# Patient Record
Sex: Male | Born: 1935
Health system: Southern US, Community
[De-identification: ages and names within clinical notes are randomized; demographics above are authoritative.]

## PROBLEM LIST (undated history)

## (undated) DIAGNOSIS — R51 Headache: Secondary | ICD-10-CM

## (undated) DIAGNOSIS — G4700 Insomnia, unspecified: Secondary | ICD-10-CM

## (undated) DIAGNOSIS — I10 Essential (primary) hypertension: Secondary | ICD-10-CM

## (undated) DIAGNOSIS — K5909 Other constipation: Secondary | ICD-10-CM

## (undated) DIAGNOSIS — E7800 Pure hypercholesterolemia, unspecified: Secondary | ICD-10-CM

## (undated) DIAGNOSIS — D496 Neoplasm of unspecified behavior of brain: Secondary | ICD-10-CM

## (undated) DIAGNOSIS — I499 Cardiac arrhythmia, unspecified: Secondary | ICD-10-CM

## (undated) HISTORY — DX: Cardiac arrhythmia, unspecified: I49.9

## (undated) HISTORY — DX: Insomnia, unspecified: G47.00

## (undated) HISTORY — DX: Other constipation: K59.09

## (undated) HISTORY — DX: Hypercalcemia: E83.52

## (undated) HISTORY — PX: BRAIN SURGERY: SHX531

## (undated) HISTORY — PX: SHOULDER SURGERY: SHX246

---

## 1998-05-08 ENCOUNTER — Ambulatory Visit (HOSPITAL_BASED_OUTPATIENT_CLINIC_OR_DEPARTMENT_OTHER): Admission: RE | Admit: 1998-05-08 | Discharge: 1998-05-08 | Payer: Self-pay | Admitting: General Surgery

## 1998-08-14 ENCOUNTER — Ambulatory Visit (HOSPITAL_COMMUNITY): Admission: RE | Admit: 1998-08-14 | Discharge: 1998-08-14 | Payer: Self-pay | Admitting: Family Medicine

## 1998-08-14 ENCOUNTER — Encounter: Payer: Self-pay | Admitting: Family Medicine

## 1999-02-11 ENCOUNTER — Ambulatory Visit (HOSPITAL_COMMUNITY): Admission: RE | Admit: 1999-02-11 | Discharge: 1999-02-11 | Payer: Self-pay | Admitting: Family Medicine

## 1999-02-11 ENCOUNTER — Encounter: Payer: Self-pay | Admitting: Family Medicine

## 2001-06-15 ENCOUNTER — Encounter: Payer: Self-pay | Admitting: Emergency Medicine

## 2001-06-15 ENCOUNTER — Emergency Department (HOSPITAL_COMMUNITY): Admission: EM | Admit: 2001-06-15 | Discharge: 2001-06-15 | Payer: Self-pay | Admitting: Emergency Medicine

## 2002-05-21 ENCOUNTER — Encounter: Payer: Self-pay | Admitting: Family Medicine

## 2002-05-21 ENCOUNTER — Ambulatory Visit (HOSPITAL_COMMUNITY): Admission: RE | Admit: 2002-05-21 | Discharge: 2002-05-21 | Payer: Self-pay | Admitting: Family Medicine

## 2002-11-10 ENCOUNTER — Encounter: Payer: Self-pay | Admitting: Neurology

## 2002-11-10 ENCOUNTER — Encounter: Admission: RE | Admit: 2002-11-10 | Discharge: 2002-11-10 | Payer: Self-pay | Admitting: Neurology

## 2003-09-21 ENCOUNTER — Emergency Department (HOSPITAL_COMMUNITY): Admission: EM | Admit: 2003-09-21 | Discharge: 2003-09-21 | Payer: Self-pay | Admitting: Emergency Medicine

## 2004-06-24 ENCOUNTER — Ambulatory Visit (HOSPITAL_COMMUNITY): Admission: RE | Admit: 2004-06-24 | Discharge: 2004-06-24 | Payer: Self-pay | Admitting: Family Medicine

## 2004-12-31 ENCOUNTER — Ambulatory Visit: Payer: Self-pay | Admitting: Physical Medicine & Rehabilitation

## 2004-12-31 ENCOUNTER — Encounter
Admission: RE | Admit: 2004-12-31 | Discharge: 2005-03-31 | Payer: Self-pay | Admitting: Physical Medicine & Rehabilitation

## 2005-02-17 ENCOUNTER — Ambulatory Visit (HOSPITAL_COMMUNITY): Admission: RE | Admit: 2005-02-17 | Discharge: 2005-02-17 | Payer: Self-pay | Admitting: *Deleted

## 2005-04-20 ENCOUNTER — Encounter
Admission: RE | Admit: 2005-04-20 | Discharge: 2005-07-19 | Payer: Self-pay | Admitting: Physical Medicine & Rehabilitation

## 2005-04-20 ENCOUNTER — Ambulatory Visit: Payer: Self-pay | Admitting: Physical Medicine & Rehabilitation

## 2005-06-01 ENCOUNTER — Ambulatory Visit: Payer: Self-pay | Admitting: Physical Medicine & Rehabilitation

## 2005-07-23 ENCOUNTER — Ambulatory Visit: Payer: Self-pay | Admitting: Physical Medicine & Rehabilitation

## 2005-07-23 ENCOUNTER — Encounter
Admission: RE | Admit: 2005-07-23 | Discharge: 2005-10-21 | Payer: Self-pay | Admitting: Physical Medicine & Rehabilitation

## 2005-09-02 ENCOUNTER — Emergency Department (HOSPITAL_COMMUNITY): Admission: EM | Admit: 2005-09-02 | Discharge: 2005-09-02 | Payer: Self-pay | Admitting: Emergency Medicine

## 2005-09-10 ENCOUNTER — Emergency Department (HOSPITAL_COMMUNITY): Admission: EM | Admit: 2005-09-10 | Discharge: 2005-09-10 | Payer: Self-pay | Admitting: Emergency Medicine

## 2005-10-21 ENCOUNTER — Ambulatory Visit: Payer: Self-pay | Admitting: Physical Medicine & Rehabilitation

## 2005-10-22 ENCOUNTER — Encounter
Admission: RE | Admit: 2005-10-22 | Discharge: 2006-01-20 | Payer: Self-pay | Admitting: Physical Medicine & Rehabilitation

## 2006-11-16 ENCOUNTER — Emergency Department (HOSPITAL_COMMUNITY): Admission: EM | Admit: 2006-11-16 | Discharge: 2006-11-16 | Payer: Self-pay | Admitting: Emergency Medicine

## 2006-12-29 ENCOUNTER — Encounter: Admission: RE | Admit: 2006-12-29 | Discharge: 2006-12-29 | Payer: Self-pay | Admitting: Orthopedic Surgery

## 2007-10-27 ENCOUNTER — Emergency Department (HOSPITAL_COMMUNITY): Admission: EM | Admit: 2007-10-27 | Discharge: 2007-10-27 | Payer: Self-pay | Admitting: Emergency Medicine

## 2008-06-25 ENCOUNTER — Emergency Department (HOSPITAL_COMMUNITY): Admission: EM | Admit: 2008-06-25 | Discharge: 2008-06-25 | Payer: Self-pay | Admitting: Emergency Medicine

## 2008-06-27 ENCOUNTER — Encounter: Payer: Self-pay | Admitting: Endocrinology

## 2008-08-18 ENCOUNTER — Inpatient Hospital Stay (HOSPITAL_COMMUNITY): Admission: EM | Admit: 2008-08-18 | Discharge: 2008-08-22 | Payer: Self-pay | Admitting: Emergency Medicine

## 2008-08-28 ENCOUNTER — Emergency Department (HOSPITAL_COMMUNITY): Admission: EM | Admit: 2008-08-28 | Discharge: 2008-08-28 | Payer: Self-pay | Admitting: Emergency Medicine

## 2008-09-12 ENCOUNTER — Encounter: Admission: RE | Admit: 2008-09-12 | Discharge: 2008-09-12 | Payer: Self-pay | Admitting: Family Medicine

## 2008-12-02 ENCOUNTER — Encounter: Payer: Self-pay | Admitting: Endocrinology

## 2008-12-02 ENCOUNTER — Encounter: Admission: RE | Admit: 2008-12-02 | Discharge: 2008-12-02 | Payer: Self-pay | Admitting: Family Medicine

## 2008-12-24 ENCOUNTER — Encounter: Admission: RE | Admit: 2008-12-24 | Discharge: 2008-12-24 | Payer: Self-pay | Admitting: Neurology

## 2009-01-09 ENCOUNTER — Ambulatory Visit: Payer: Self-pay | Admitting: Endocrinology

## 2009-01-09 DIAGNOSIS — E210 Primary hyperparathyroidism: Secondary | ICD-10-CM | POA: Insufficient documentation

## 2009-01-09 DIAGNOSIS — E7800 Pure hypercholesterolemia, unspecified: Secondary | ICD-10-CM

## 2009-01-09 DIAGNOSIS — R51 Headache: Secondary | ICD-10-CM

## 2009-01-09 DIAGNOSIS — I10 Essential (primary) hypertension: Secondary | ICD-10-CM | POA: Insufficient documentation

## 2009-01-09 DIAGNOSIS — F411 Generalized anxiety disorder: Secondary | ICD-10-CM | POA: Insufficient documentation

## 2009-01-09 DIAGNOSIS — G4700 Insomnia, unspecified: Secondary | ICD-10-CM

## 2009-01-21 ENCOUNTER — Encounter: Payer: Self-pay | Admitting: Endocrinology

## 2009-02-03 ENCOUNTER — Telehealth: Payer: Self-pay | Admitting: Endocrinology

## 2009-02-13 ENCOUNTER — Telehealth: Payer: Self-pay | Admitting: Endocrinology

## 2009-07-01 ENCOUNTER — Ambulatory Visit: Payer: Self-pay | Admitting: Endocrinology

## 2009-07-01 DIAGNOSIS — M25519 Pain in unspecified shoulder: Secondary | ICD-10-CM | POA: Insufficient documentation

## 2009-07-01 DIAGNOSIS — M810 Age-related osteoporosis without current pathological fracture: Secondary | ICD-10-CM

## 2009-07-07 ENCOUNTER — Encounter: Payer: Self-pay | Admitting: Endocrinology

## 2009-07-07 ENCOUNTER — Telehealth: Payer: Self-pay | Admitting: Endocrinology

## 2009-07-09 LAB — CONVERTED CEMR LAB
Calcium, Total (PTH): 11.1 mg/dL — ABNORMAL HIGH (ref 8.4–10.5)
PTH: 174.8 pg/mL — ABNORMAL HIGH (ref 14.0–72.0)

## 2009-08-25 ENCOUNTER — Emergency Department (HOSPITAL_COMMUNITY): Admission: EM | Admit: 2009-08-25 | Discharge: 2009-08-25 | Payer: Self-pay | Admitting: Internal Medicine

## 2009-11-21 ENCOUNTER — Emergency Department (HOSPITAL_COMMUNITY): Admission: EM | Admit: 2009-11-21 | Discharge: 2009-11-21 | Payer: Self-pay | Admitting: Emergency Medicine

## 2010-03-27 ENCOUNTER — Emergency Department (HOSPITAL_COMMUNITY): Admission: EM | Admit: 2010-03-27 | Discharge: 2010-03-27 | Payer: Self-pay | Admitting: Emergency Medicine

## 2010-05-18 ENCOUNTER — Ambulatory Visit (HOSPITAL_COMMUNITY)
Admission: RE | Admit: 2010-05-18 | Discharge: 2010-05-18 | Payer: Self-pay | Source: Home / Self Care | Attending: Family Medicine | Admitting: Family Medicine

## 2010-05-26 ENCOUNTER — Emergency Department (HOSPITAL_COMMUNITY)
Admission: EM | Admit: 2010-05-26 | Discharge: 2010-05-26 | Payer: Self-pay | Source: Home / Self Care | Admitting: Emergency Medicine

## 2010-05-27 LAB — COMPREHENSIVE METABOLIC PANEL
BUN: 9 mg/dL (ref 6–23)
CO2: 28 mEq/L (ref 19–32)
Calcium: 11.3 mg/dL — ABNORMAL HIGH (ref 8.4–10.5)
Creatinine, Ser: 1.03 mg/dL (ref 0.4–1.5)
GFR calc non Af Amer: >60 mL/min (ref >60)
Glucose, Bld: 104 mg/dL — ABNORMAL HIGH (ref 70–99)

## 2010-05-27 LAB — CBC
HCT: 38.5 % — ABNORMAL LOW (ref 39.0–52.0)
Hemoglobin: 13.2 g/dL (ref 13.0–17.0)
MCH: 31.5 pg (ref 26.0–34.0)
MCV: 91.9 fL (ref 78.0–100.0)
RBC: 4.19 MIL/uL — ABNORMAL LOW (ref 4.22–5.81)

## 2010-05-27 LAB — DIFFERENTIAL
Lymphs Abs: 0.8 10*3/uL (ref 0.7–4.0)
Monocytes Relative: 9 % (ref 3–12)
Neutro Abs: 2.8 10*3/uL (ref 1.7–7.7)
Neutrophils Relative %: 71 % (ref 43–77)

## 2010-05-27 LAB — URINE CULTURE

## 2010-05-27 LAB — PROTIME-INR
INR: 0.92 (ref 0.00–1.49)
Prothrombin Time: 12.6 seconds (ref 11.6–15.2)

## 2010-05-27 LAB — URINALYSIS, ROUTINE W REFLEX MICROSCOPIC
Bilirubin Urine: NEGATIVE
Hgb urine dipstick: NEGATIVE
Protein, ur: NEGATIVE mg/dL
Urobilinogen, UA: 0.2 mg/dL (ref 0.0–1.0)

## 2010-05-27 LAB — POCT CARDIAC MARKERS: Troponin i, poc: <0.05 ng/mL (ref 0.00–0.09)

## 2010-05-27 LAB — LIPASE, BLOOD: Lipase: 22 U/L (ref 11–59)

## 2010-06-02 NOTE — Assessment & Plan Note (Signed)
Summary: follow up per dr swartz-lb   Vital Signs:  Patient profile:   77 year old male Height:      72 inches (182.88 cm) Weight:      149.38 pounds (67.90 kg) O2 Sat:      98 % on Room air Temp:     98.5 degrees F (36.94 degrees C) oral Pulse rate:   95 / minute BP sitting:   120 / 66  (left arm) Cuff size:   regular  Vitals Entered By: Josph Macho RMA (July 01, 2009 8:48 AM)  O2 Flow:  Room air CC: Follow-up visit per Swartz/ CF   Referring Provider:  swayne Primary Provider:  Azucena Cecil  CC:  Follow-up visit per Swartz/ CF.  History of Present Illness: the status of 3 chronic medical problems is addressed today: pt is here to f/u primary hyperparathyroidism.  pt states he feels well in general, except for arthralgias, worse at the right shoulder. mild osteoporosis was noted in 2010.  he denies falls. htn:  he take zestoretic as rx'ed.  he denies cramps.   Current Medications (verified): 1)  Tizanidine Hcl 2 Mg Tabs (Tizanidine Hcl) .Marland Kitchen.. 1 Tab Three Times A Day As Needed 2)  Simvastatin 20 Mg Tabs (Simvastatin) .Marland Kitchen.. 1 Tab Qpm 3)  Lisinopril-Hydrochlorothiazide 20-25 Mg Tabs (Lisinopril-Hydrochlorothiazide) .... 1/2 Tab Daily 4)  Lorazepam 1 Mg Tabs (Lorazepam) .Marland Kitchen.. 1 At Bedtime As Needed 5)  Hydrocodone-Acetaminophen 5-500 Mg Tabs (Hydrocodone-Acetaminophen) .... As Needed  Allergies (verified): No Known Drug Allergies  Past History:  Past Medical History: Last updated: 01/09/2009 HYPERTENSION (ICD-401.9) PRIMARY HYPERPARATHYROIDISM (ICD-252.01) HYPERCALCEMIA (ICD-275.42) HEADACHE (ICD-784.0) ANXIETY STATE, UNSPECIFIED (ICD-300.00) HYPERCHOLESTEROLEMIA, MIXED (ICD-272.0) INSOMNIA UNSPECIFIED (ICD-780.52)  Review of Systems       denies numbness and constipation  Physical Exam  General:  normal appearance.   Msk:  gait is normal and steady Extremities:  right shoulder: abduction is limited to 30 degrees, by pain. Additional Exam:  dexa (2010)   osteopenia  RIGHT SHOULDER - 2+ VIEW Advanced degenerative changes in the right shoulder.   Impression & Recommendations:  Problem # 1:  PRIMARY HYPERPARATHYROIDISM (ICD-252.01) hypercalcemia could raise ca++ level.  Problem # 2:  HYPERTENSION (ICD-401.9) he may be controlled with a lower dosage of hctz  Problem # 3:  SHOULDER PAIN, RIGHT (ICD-719.41) due to oa  Problem # 4:  OSTEOPOROSIS (ICD-733.00) mild.  is prob contributed to by #1  Medications Added to Medication List This Visit: 1)  Lisinopril-hydrochlorothiazide 20-25 Mg Tabs (Lisinopril-hydrochlorothiazide) .... 1/2 tab daily  Other Orders: T-Parathyroid Hormone, Intact w/ Calcium (16109-60454) T-Shoulder Right (73030TC) Est. Patient Level IV (09811)  Patient Instructions: 1)  tests are being ordered for you today.  a few days after the test(s), please call [phone] to hear your test results. 2)  return 6 months. 3)  pending the test results, please continue the same medications for now. 4)  (update: i left message on phone-tree:  i offered ref ortho, or you may prefer to see dr Azucena Cecil for this, which is a good idea.  as of 07/05/09, pth result is not available.  pt will be called to see if he had it drawn).

## 2010-06-02 NOTE — Progress Notes (Signed)
----   Converted from flag ---- ---- 07/05/2009 5:50 PM, Minus Breeding MD wrote: please call patient: did he have blood drawn when he was here? ------------------------------  Phone Note Outgoing Call   Summary of Call: Called pt and spoke to spouse. Spouse informed me that pt would go to lab sometime today. Initial call taken by: Josph Macho RMA,  July 07, 2009 8:28 AM  Follow-up for Phone Call        thank you Follow-up by: Minus Breeding MD,  July 07, 2009 8:54 AM

## 2010-07-14 LAB — HEPATIC FUNCTION PANEL
ALT: 18 U/L (ref 0–53)
AST: 18 U/L (ref 0–37)
Albumin: 4.5 g/dL (ref 3.5–5.2)
Total Protein: 7.6 g/dL (ref 6.0–8.3)

## 2010-07-14 LAB — BASIC METABOLIC PANEL
BUN: 18 mg/dL (ref 6–23)
GFR calc non Af Amer: >60 mL/min (ref >60)
Potassium: 4.6 mEq/L (ref 3.5–5.1)

## 2010-07-14 LAB — CBC
HCT: 41.3 % (ref 39.0–52.0)
Platelets: 193 10*3/uL (ref 150–400)
RDW: 11.4 % — ABNORMAL LOW (ref 11.5–15.5)
WBC: 3.8 10*3/uL — ABNORMAL LOW (ref 4.0–10.5)

## 2010-07-14 LAB — URINALYSIS, ROUTINE W REFLEX MICROSCOPIC
Bilirubin Urine: NEGATIVE
Glucose, UA: NEGATIVE mg/dL
Hgb urine dipstick: NEGATIVE
Specific Gravity, Urine: 1.014 (ref 1.005–1.030)
pH: 5.5 (ref 5.0–8.0)

## 2010-07-14 LAB — DIFFERENTIAL
Basophils Absolute: 0 10*3/uL (ref 0.0–0.1)
Lymphocytes Relative: 29 % (ref 12–46)
Monocytes Absolute: 0.4 10*3/uL (ref 0.1–1.0)
Neutro Abs: 2.2 10*3/uL (ref 1.7–7.7)
Neutrophils Relative %: 59 % (ref 43–77)

## 2010-07-18 LAB — URINALYSIS, ROUTINE W REFLEX MICROSCOPIC
Bilirubin Urine: NEGATIVE
Hgb urine dipstick: NEGATIVE
Ketones, ur: NEGATIVE mg/dL
Nitrite: NEGATIVE
Protein, ur: NEGATIVE mg/dL
Specific Gravity, Urine: 1.024 (ref 1.005–1.030)
Urobilinogen, UA: 0.2 mg/dL (ref 0.0–1.0)

## 2010-07-18 LAB — CBC
HCT: 36.5 % — ABNORMAL LOW (ref 39.0–52.0)
MCV: 96.3 fL (ref 78.0–100.0)
RBC: 3.79 MIL/uL — ABNORMAL LOW (ref 4.22–5.81)
WBC: 4 10*3/uL (ref 4.0–10.5)

## 2010-07-18 LAB — COMPREHENSIVE METABOLIC PANEL
ALT: 15 U/L (ref 0–53)
Albumin: 4 g/dL (ref 3.5–5.2)
Alkaline Phosphatase: 76 U/L (ref 39–117)
BUN: 16 mg/dL (ref 6–23)
Chloride: 108 mEq/L (ref 96–112)
Glucose, Bld: 110 mg/dL — ABNORMAL HIGH (ref 70–99)
Potassium: 4.1 mEq/L (ref 3.5–5.1)
Sodium: 139 mEq/L (ref 135–145)
Total Bilirubin: 0.7 mg/dL (ref 0.3–1.2)

## 2010-07-18 LAB — DIFFERENTIAL
Basophils Absolute: 0 10*3/uL (ref 0.0–0.1)
Basophils Relative: 1 % (ref 0–1)
Eosinophils Absolute: 0 10*3/uL (ref 0.0–0.7)
Monocytes Absolute: 0.4 10*3/uL (ref 0.1–1.0)
Neutro Abs: 2.8 10*3/uL (ref 1.7–7.7)
Neutrophils Relative %: 69 % (ref 43–77)

## 2010-07-21 LAB — DIFFERENTIAL
Basophils Absolute: 0 10*3/uL (ref 0.0–0.1)
Eosinophils Relative: 1 % (ref 0–5)
Lymphocytes Relative: 22 % (ref 12–46)
Lymphs Abs: 0.8 10*3/uL (ref 0.7–4.0)
Monocytes Absolute: 0.4 10*3/uL (ref 0.1–1.0)
Neutro Abs: 2.4 10*3/uL (ref 1.7–7.7)

## 2010-07-21 LAB — PROTIME-INR: Prothrombin Time: 12.7 seconds (ref 11.6–15.2)

## 2010-07-21 LAB — COMPREHENSIVE METABOLIC PANEL
AST: 18 U/L (ref 0–37)
Albumin: 3.9 g/dL (ref 3.5–5.2)
BUN: 13 mg/dL (ref 6–23)
Calcium: 10.7 mg/dL — ABNORMAL HIGH (ref 8.4–10.5)
Chloride: 103 mEq/L (ref 96–112)
Creatinine, Ser: 1.08 mg/dL (ref 0.4–1.5)
GFR calc Af Amer: >60 mL/min (ref >60)
Total Bilirubin: 1 mg/dL (ref 0.3–1.2)

## 2010-07-21 LAB — URINALYSIS, ROUTINE W REFLEX MICROSCOPIC
Bilirubin Urine: NEGATIVE
Glucose, UA: NEGATIVE mg/dL
Hgb urine dipstick: NEGATIVE
Ketones, ur: NEGATIVE mg/dL
Protein, ur: NEGATIVE mg/dL
Urobilinogen, UA: 0.2 mg/dL (ref 0.0–1.0)

## 2010-07-21 LAB — CBC
HCT: 35.8 % — ABNORMAL LOW (ref 39.0–52.0)
MCHC: 36 g/dL (ref 30.0–36.0)
MCV: 95.7 fL (ref 78.0–100.0)
Platelets: 190 10*3/uL (ref 150–400)
RDW: 11.6 % (ref 11.5–15.5)

## 2010-08-12 LAB — CBC
HCT: 34 % — ABNORMAL LOW (ref 39.0–52.0)
Hemoglobin: 11.2 g/dL — ABNORMAL LOW (ref 13.0–17.0)
Hemoglobin: 11.6 g/dL — ABNORMAL LOW (ref 13.0–17.0)
Hemoglobin: 9.6 g/dL — ABNORMAL LOW (ref 13.0–17.0)
MCHC: 34.2 g/dL (ref 30.0–36.0)
MCV: 95.2 fL (ref 78.0–100.0)
MCV: 96 fL (ref 78.0–100.0)
MCV: 96.5 fL (ref 78.0–100.0)
Platelets: 204 10*3/uL (ref 150–400)
RBC: 2.78 MIL/uL — ABNORMAL LOW (ref 4.22–5.81)
RBC: 2.88 MIL/uL — ABNORMAL LOW (ref 4.22–5.81)
RBC: 3.39 MIL/uL — ABNORMAL LOW (ref 4.22–5.81)
RBC: 3.54 MIL/uL — ABNORMAL LOW (ref 4.22–5.81)
RDW: 11.9 % (ref 11.5–15.5)
WBC: 4.6 10*3/uL (ref 4.0–10.5)
WBC: 6.9 10*3/uL (ref 4.0–10.5)

## 2010-08-12 LAB — URINALYSIS, ROUTINE W REFLEX MICROSCOPIC
Bilirubin Urine: NEGATIVE
Glucose, UA: NEGATIVE mg/dL
Glucose, UA: NEGATIVE mg/dL
Hgb urine dipstick: NEGATIVE
Ketones, ur: 15 mg/dL — AB
Ketones, ur: NEGATIVE mg/dL
Nitrite: NEGATIVE
Nitrite: NEGATIVE
Protein, ur: NEGATIVE mg/dL
Specific Gravity, Urine: 1.005 (ref 1.005–1.030)
Urobilinogen, UA: 0.2 mg/dL (ref 0.0–1.0)
pH: 5.5 (ref 5.0–8.0)
pH: 7.5 (ref 5.0–8.0)

## 2010-08-12 LAB — DIFFERENTIAL
Basophils Absolute: 0 10*3/uL (ref 0.0–0.1)
Basophils Relative: 0 % (ref 0–1)
Basophils Relative: 0 % (ref 0–1)
Eosinophils Absolute: 0 10*3/uL (ref 0.0–0.7)
Eosinophils Absolute: 0 10*3/uL (ref 0.0–0.7)
Eosinophils Absolute: 0.1 10*3/uL (ref 0.0–0.7)
Eosinophils Relative: 0 % (ref 0–5)
Eosinophils Relative: 1 % (ref 0–5)
Lymphocytes Relative: 11 % — ABNORMAL LOW (ref 12–46)
Lymphocytes Relative: 8 % — ABNORMAL LOW (ref 12–46)
Lymphs Abs: 0.7 10*3/uL (ref 0.7–4.0)
Lymphs Abs: 1.2 10*3/uL (ref 0.7–4.0)
Monocytes Absolute: 0.4 10*3/uL (ref 0.1–1.0)
Monocytes Absolute: 0.5 10*3/uL (ref 0.1–1.0)
Monocytes Absolute: 0.7 10*3/uL (ref 0.1–1.0)
Monocytes Absolute: 1 10*3/uL (ref 0.1–1.0)
Monocytes Relative: 7 % (ref 3–12)
Monocytes Relative: 8 % (ref 3–12)
Monocytes Relative: 8 % (ref 3–12)
Monocytes Relative: 8 % (ref 3–12)
Neutro Abs: 5.6 10*3/uL (ref 1.7–7.7)
Neutro Abs: 8.2 10*3/uL — ABNORMAL HIGH (ref 1.7–7.7)
Neutrophils Relative %: 64 % (ref 43–77)
Neutrophils Relative %: 81 % — ABNORMAL HIGH (ref 43–77)

## 2010-08-12 LAB — IRON AND TIBC: Iron: 16 ug/dL — ABNORMAL LOW (ref 42–135)

## 2010-08-12 LAB — POCT I-STAT, CHEM 8
BUN: 14 mg/dL (ref 6–23)
Calcium, Ion: 1.36 mmol/L — ABNORMAL HIGH (ref 1.12–1.32)
Chloride: 105 mEq/L (ref 96–112)
Creatinine, Ser: 1.1 mg/dL (ref 0.4–1.5)
Glucose, Bld: 104 mg/dL — ABNORMAL HIGH (ref 70–99)
HCT: 34 % — ABNORMAL LOW (ref 39.0–52.0)
Hemoglobin: 11.6 g/dL — ABNORMAL LOW (ref 13.0–17.0)
Potassium: 4 mEq/L (ref 3.5–5.1)
Sodium: 137 mEq/L (ref 135–145)
TCO2: 25 mmol/L (ref 0–100)

## 2010-08-12 LAB — RETICULOCYTES
RBC.: 3.25 MIL/uL — ABNORMAL LOW (ref 4.22–5.81)
Retic Count, Absolute: 22.8 10*3/uL (ref 19.0–186.0)

## 2010-08-12 LAB — BASIC METABOLIC PANEL
BUN: 11 mg/dL (ref 6–23)
CO2: 26 mEq/L (ref 19–32)
Calcium: 10.2 mg/dL (ref 8.4–10.5)
Calcium: 10.4 mg/dL (ref 8.4–10.5)
Chloride: 98 mEq/L (ref 96–112)
Creatinine, Ser: 0.88 mg/dL (ref 0.4–1.5)
GFR calc Af Amer: >60 mL/min (ref >60)
GFR calc Af Amer: >60 mL/min (ref >60)
GFR calc Af Amer: >60 mL/min (ref >60)
GFR calc non Af Amer: >60 mL/min (ref >60)
GFR calc non Af Amer: >60 mL/min (ref >60)
Glucose, Bld: 120 mg/dL — ABNORMAL HIGH (ref 70–99)
Sodium: 133 mEq/L — ABNORMAL LOW (ref 135–145)
Sodium: 135 mEq/L (ref 135–145)

## 2010-08-12 LAB — CULTURE, RESPIRATORY W GRAM STAIN: Culture: NORMAL

## 2010-08-12 LAB — CULTURE, BLOOD (ROUTINE X 2)
Culture: NO GROWTH
Culture: NO GROWTH

## 2010-08-12 LAB — HEPATIC FUNCTION PANEL
ALT: 16 U/L (ref 0–53)
AST: 21 U/L (ref 0–37)
Albumin: 2.9 g/dL — ABNORMAL LOW (ref 3.5–5.2)
Total Bilirubin: 1 mg/dL (ref 0.3–1.2)

## 2010-08-12 LAB — POCT CARDIAC MARKERS
CKMB, poc: 2.2 ng/mL (ref 1.0–8.0)
Myoglobin, poc: 121 ng/mL (ref 12–200)
Troponin i, poc: <0.05 ng/mL (ref 0.00–0.09)

## 2010-08-12 LAB — PROTIME-INR: INR: 1.1 (ref 0.00–1.49)

## 2010-08-12 LAB — APTT: aPTT: 34 seconds (ref 24–37)

## 2010-08-12 LAB — VITAMIN B12: Vitamin B-12: 496 pg/mL (ref 211–911)

## 2010-08-12 LAB — FERRITIN: Ferritin: 2018 ng/mL — ABNORMAL HIGH (ref 22–322)

## 2010-08-12 LAB — EXPECTORATED SPUTUM ASSESSMENT W GRAM STAIN, RFLX TO RESP C

## 2010-08-18 LAB — COMPREHENSIVE METABOLIC PANEL
AST: 20 U/L (ref 0–37)
Albumin: 3.6 g/dL (ref 3.5–5.2)
Alkaline Phosphatase: 73 U/L (ref 39–117)
Chloride: 103 mEq/L (ref 96–112)
Creatinine, Ser: 1.06 mg/dL (ref 0.4–1.5)
GFR calc Af Amer: >60 mL/min (ref >60)
Potassium: 3.9 mEq/L (ref 3.5–5.1)
Total Bilirubin: 0.7 mg/dL (ref 0.3–1.2)

## 2010-08-18 LAB — POCT CARDIAC MARKERS
CKMB, poc: 1.9 ng/mL (ref 1.0–8.0)
Troponin i, poc: <0.05 ng/mL (ref 0.00–0.09)

## 2010-08-18 LAB — DIFFERENTIAL
Basophils Absolute: 0 10*3/uL (ref 0.0–0.1)
Eosinophils Relative: 1 % (ref 0–5)
Lymphocytes Relative: 19 % (ref 12–46)
Monocytes Absolute: 0.3 10*3/uL (ref 0.1–1.0)

## 2010-08-18 LAB — BRAIN NATRIURETIC PEPTIDE: Pro B Natriuretic peptide (BNP): <30 pg/mL (ref 0.0–100.0)

## 2010-08-18 LAB — HEMOCCULT GUIAC POC 1CARD (OFFICE): Fecal Occult Bld: NEGATIVE

## 2010-08-18 LAB — CBC
Platelets: 196 10*3/uL (ref 150–400)
WBC: 3.1 10*3/uL — ABNORMAL LOW (ref 4.0–10.5)

## 2010-09-10 ENCOUNTER — Emergency Department (HOSPITAL_COMMUNITY)
Admission: EM | Admit: 2010-09-10 | Discharge: 2010-09-10 | Disposition: A | Discharge status: Home / Self Care | Payer: Medicare Other | Attending: Emergency Medicine | Admitting: Emergency Medicine

## 2010-09-10 ENCOUNTER — Emergency Department (HOSPITAL_COMMUNITY): Payer: Medicare Other

## 2010-09-10 ENCOUNTER — Encounter (HOSPITAL_COMMUNITY): Payer: Self-pay | Admitting: Radiology

## 2010-09-10 DIAGNOSIS — M25569 Pain in unspecified knee: Secondary | ICD-10-CM | POA: Insufficient documentation

## 2010-09-10 DIAGNOSIS — Z79899 Other long term (current) drug therapy: Secondary | ICD-10-CM | POA: Insufficient documentation

## 2010-09-10 DIAGNOSIS — F411 Generalized anxiety disorder: Secondary | ICD-10-CM | POA: Insufficient documentation

## 2010-09-10 DIAGNOSIS — R29898 Other symptoms and signs involving the musculoskeletal system: Secondary | ICD-10-CM | POA: Insufficient documentation

## 2010-09-10 DIAGNOSIS — M25519 Pain in unspecified shoulder: Secondary | ICD-10-CM | POA: Insufficient documentation

## 2010-09-10 DIAGNOSIS — R51 Headache: Secondary | ICD-10-CM | POA: Insufficient documentation

## 2010-09-10 DIAGNOSIS — I10 Essential (primary) hypertension: Secondary | ICD-10-CM | POA: Insufficient documentation

## 2010-09-10 DIAGNOSIS — G8929 Other chronic pain: Secondary | ICD-10-CM | POA: Insufficient documentation

## 2010-09-10 DIAGNOSIS — M129 Arthropathy, unspecified: Secondary | ICD-10-CM | POA: Insufficient documentation

## 2010-09-10 DIAGNOSIS — E7800 Pure hypercholesterolemia, unspecified: Secondary | ICD-10-CM | POA: Insufficient documentation

## 2010-09-10 DIAGNOSIS — M1990 Unspecified osteoarthritis, unspecified site: Secondary | ICD-10-CM | POA: Insufficient documentation

## 2010-09-10 HISTORY — DX: Essential (primary) hypertension: I10

## 2010-09-10 HISTORY — DX: Neoplasm of unspecified behavior of brain: D49.6

## 2010-09-15 NOTE — H&P (Signed)
NAMEEBONY, Richard Mack NO.:  [id number]   MEDICAL RECORD NO.:  [id number]          PATIENT TYPE:  EMS   LOCATION:  MAJO                         FACILITY:  [facility]   PHYSICIAN:  Ramiro Harvest, MD    DATE OF BIRTH:  February 17, 1929   DATE OF ADMISSION:  08/18/2008  DATE OF DISCHARGE:                              HISTORY & PHYSICAL   PRIMARY CARE PHYSICIAN:  Dr. Azucena Cecil, of [facility].   HISTORY OF PRESENT ILLNESS:  Richard Mack is a 77 year old African  American male with a history of brain tumor/cyst status post resection,  hyperlipidemia, hypertension, osteoarthritis, presented to the ED with a  5-day history of worsening productive cough of yellowish sputum, some  myalgias, fevers, upper respiratory symptoms, shortness of breath,  chills, nausea, global weakness and abdominal pain secondary to coughing  and concentrated urine per patient.  The patient denies any emesis, no  diarrhea, no constipation, no melena, no hematemesis, no hematochezia,  no focal neurological symptoms.  The patient also complaining of right  chronic shoulder pain.  The patient was seen in the ED.  A CBC obtained  showed a white count of 12.4, hemoglobin of 11.6, otherwise was within  normal limits.  Basic metabolic profile with a BUN of 27, otherwise was  within normal limits.  UA with trace leukocytes and nitrite negative.  Chest x-ray showed a right lower lobe pneumonia.  The patient was given  some IV Rocephin and azithromycin in the emergency department.  We are  called to admit the patient for further evaluation and management.   ALLERGIES:  No known drug allergies.   PAST MEDICAL HISTORY:  1. Brain cyst/tumor status post resection in the 1990s.  2. Hyperlipidemia.  3. Hypertension.  4. History of osteoarthritis.  5. History of anxiety.  6. A history of complete supraspinatus tendon tear with likely at      least partial tearing of the infraspinatus in August 2008.  7. A history  of bulky acromioclavicular osteoarthritis and subacromial      subdeltoid bursitis per MRI of August 2008.   HOME MEDICATIONS:  1. Hydrocodone/APAP 5/500 1 tablet p.o. q.8 h p.r.n. pain.  2. Simvastatin 20 mg p.o. daily.  3. Lisinopril/hydrochlorothiazide 20/25 one tablet p.o. daily.  4. Lorazepam 1 mg nightly p.r.n.   SOCIAL HISTORY:  The patient is married.  Remote tobacco history.  Remote alcohol use.  No IV drug use.   FAMILY HISTORY:  Noncontributory.   REVIEW OF SYSTEMS:  As per HPI, otherwise negative.   PHYSICAL EXAM:  Initial temperature 102.2, blood pressure 114/64, pulse  of 118, respiratory rate 20, saturating 99%.  GENERAL:  Patient in no apparent distress.  HEENT:  Normocephalic, atraumatic.  Pupils equal, round and reactive to  light and accommodation.  Arcus senilis.  Extraocular movements intact.  Oropharynx is clear, no lesions, no exudates.  NECK:  Supple, no lymphadenopathy, dry mucous membranes.  RESPIRATORY:  Bibasilar crackles, no wheezing.  CARDIOVASCULAR:  Tachycardiac, regular rhythm.  No murmurs, rubs or  gallops.  ABDOMEN:  Soft, nontender, nondistended.  Positive bowel sounds.  EXTREMITIES:  No clubbing, cyanosis or edema.  NEUROLOGICALLY:  The patient is alert and oriented x3.  Cranial Nerves:  II-XII are grossly intact.  No focal deficits.   ADMISSION LABS:  CBC:  White count 12.4, a hemoglobin of 11.6,  hematocrit of 34.0, platelet count of 210.  ANC of 10.8.  Basic  metabolic profile:  Sodium 133, potassium 4.0, chloride 98, bicarbonate  26, glucose 120.  BUN 27, creatinine 1.35, calcium of 11.0.  UA is  amber, is cloudy, specific gravity 1.020, pH of 5.5, glucose negative,  bilirubin small, ketones 15, blood negative, protein 30, urobilinogen  greater than 8, nitrite negative, leukocytes trace.  Urine microscopy:  WBC 0-2, RBCs 0-2.  Chest x-ray shows a right lower lobe pneumonia with  chronic changes.  Electrocardiogram shows sinus tachycardia  with  occasional PVCs.   ASSESSMENT AND PLAN:  Richard Mack is a 77 year old gentleman history of  brain tumor/cyst status post resection per patient, hyperlipidemia,  hypertension presenting to the ED with a 5-day history of upper  respiratory symptoms, cough, chills, productive cough of yellowish  sputum, chills, fever and shortness of breath, global weakness and  myalgias found to have a right lower lobe pneumonia.  1. Right lower lobe pneumonia likely a community-acquired pneumonia.      Will admit the patient to a regular bed, check a sputum Gram stain      and culture, check a urine Legionella, pneumococcus antigen.  Check      a hepatic panel.  Blood cultures are pending.  Place on empiric      intravenous Rocephin and azithromycin and follow.  Will admit the      patient to telemetry.  2. Dehydration.  Intravenous fluids.  3. Leukocytosis, likely secondary to problem number one, see number      one.  4. Hyperlipidemia.  Simvastatin.  5. Sinus tachycardia is likely secondary to problem numbers one and      two.  Hydrate with intravenous fluids and follow.  If no      improvement in sinus tachycardia or worsening, will cycle cardiac      enzymes.  Will monitor for now.  6. Hypertension.  Hold blood pressure medication secondary to problem      number two and follow.  7. Hyperlipidemia.  Continue home dose simvastatin.  8. Anxiety.  Ativan.  9. History of brain tumor, status post resection.  10.Anemia.  We will check an anemia panel and guaiac stools.  11.Prophylaxis.  Protonix for gastrointestinal prophylaxis, Lovenox      for deep vein thrombosis prophylaxis.   It has been a pleasure taking care of Richard Mack.      Ramiro Harvest, MD  Electronically Signed     DT/MEDQ  D:  08/18/2008  T:  08/18/2008  Job:  [id number]   cc:   Tally Joe, M.D. old African  American male with a history of brain tumor/cyst status post resection,  hyperlipidemia, hypertension, osteoarthritis, presented to the ED with a  5-day history of worsening productive cough of yellowish sputum, some  myalgias, fevers, upper respiratory symptoms, shortness of breath,  chills, nausea, global weakness and abdominal pain secondary to coughing  and concentrated urine per patient.  The patient denies any emesis, no  diarrhea, no constipation, no melena, no hematemesis, no hematochezia,  no focal neurological symptoms.  The patient also complaining of right  chronic shoulder pain.  The patient was seen in the ED.  A CBC obtained  showed a white count of 12.4, hemoglobin of 11.6, otherwise was within  normal limits.  Basic metabolic profile with a BUN of 27, otherwise was  within normal limits.  UA with trace leukocytes and nitrite negative.  Chest x-ray showed a right lower lobe pneumonia.  The patient was given  some IV Rocephin and azithromycin in the emergency department.  We are  called to admit the patient for further evaluation and management.   ALLERGIES:  No known drug allergies.   PAST MEDICAL HISTORY:  1. Brain cyst/tumor status post resection in the 1990s.  2. Hyperlipidemia.  3. Hypertension.  4. History of osteoarthritis.  5. History of anxiety.  6. A history of complete supraspinatus tendon tear with likely at      least partial tearing of the infraspinatus in August 2008.  7. A history  of bulky acromioclavicular osteoarthritis and subacromial      subdeltoid bursitis per MRI of August 2008.   HOME MEDICATIONS:  1. Hydrocodone/APAP 5/500 1 tablet p.o. q.8 h p.r.n. pain.  2. Simvastatin 20 mg p.o. daily.  3. Lisinopril/hydrochlorothiazide 20/25 one tablet p.o. daily.  4. Lorazepam 1 mg nightly p.r.n.   SOCIAL HISTORY:  The patient is married.  Remote tobacco history.  Remote alcohol use.  No IV drug use.   FAMILY HISTORY:  Noncontributory.   REVIEW OF SYSTEMS:  As per HPI, otherwise negative.   PHYSICAL EXAM:  Initial temperature 102.2, blood pressure 114/64, pulse  of 118, respiratory rate 20, saturating 99%.  GENERAL:  Patient in no apparent distress.  HEENT:  Normocephalic, atraumatic.  Pupils equal, round and reactive to  light and accommodation.  Arcus senilis.  Extraocular movements intact.  Oropharynx is clear, no lesions, no exudates.  NECK:  Supple, no lymphadenopathy, dry mucous membranes.  RESPIRATORY:  Bibasilar crackles, no wheezing.  CARDIOVASCULAR:  Tachycardiac, regular rhythm.  No murmurs, rubs or  gallops.  ABDOMEN:  Soft, nontender, nondistended.  Positive bowel sounds.  EXTREMITIES:  No clubbing, cyanosis or edema.  NEUROLOGICALLY:  The patient is alert and oriented x3.  Cranial Nerves:  II-XII are grossly intact.  No focal deficits.   ADMISSION LABS:  CBC:  White count 12.4, a hemoglobin of 11.6,  hematocrit of 34.0, platelet count of 210.  ANC of 10.8.  Basic  metabolic profile:  Sodium 133, potassium 4.0, chloride 98, bicarbonate  26, glucose 120.  BUN 27, creatinine 1.35, calcium of 11.0.  UA is  amber, is cloudy, specific gravity 1.020, pH of 5.5, glucose negative,  bilirubin small, ketones 15, blood negative, protein 30, urobilinogen  greater than 8, nitrite negative, leukocytes trace.  Urine microscopy:  WBC 0-2, RBCs 0-2.  Chest x-ray shows a right lower lobe pneumonia with  chronic changes.  Electrocardiogram shows sinus tachycardia  with  occasional PVCs.   ASSESSMENT AND PLAN:  Richard Mack is a 77 year old gentleman history of  brain tumor/cyst status post resection per patient, hyperlipidemia,  hypertension presenting to the ED with a 5-day history of upper  respiratory symptoms, cough, chills, productive cough of yellowish  sputum, chills, fever and shortness of breath, global weakness and  myalgias found to have a right lower lobe pneumonia.  1. Right lower lobe pneumonia likely a community-acquired pneumonia.      Will admit the patient to a regular bed, check a sputum Gram stain      and culture, check a urine Legionella, pneumococcus antigen.  Check      a hepatic panel.  Blood cultures are pending.  Place on empiric      intravenous Rocephin and azithromycin and follow.  Will admit the      patient to telemetry.  2. Dehydration.  Intravenous fluids.  3. Leukocytosis, likely secondary to problem number one, see number      one.  4. Hyperlipidemia.  Simvastatin.  5. Sinus tachycardia is likely secondary to problem numbers one and      two.  Hydrate with intravenous fluids and follow.  If no      improvement in sinus tachycardia or worsening, will cycle cardiac      enzymes.  Will monitor for now.  6. Hypertension.  Hold blood pressure medication secondary to problem      number two and follow.  7. Hyperlipidemia.  Continue home dose simvastatin.  8. Anxiety.  Ativan.  9. History of brain tumor, status post resection.  10.Anemia.  We will check an anemia panel and guaiac stools.  11.Prophylaxis.  Protonix for gastrointestinal prophylaxis, Lovenox      for deep vein thrombosis prophylaxis.   It has been a pleasure taking care of Richard Mack.      Ramiro Harvest, MD  Electronically Signed     DT/MEDQ  D:  08/18/2008  T:  08/18/2008  Job:  [id number]   cc:   Tally Joe, M.D.

## 2010-09-15 NOTE — Discharge Summary (Signed)
[name], Richard Mack                ACCOUNT NO.:  [id number]   MEDICAL RECORD NO.:  [id number]          PATIENT TYPE:  INP   LOCATION:  5008                         FACILITY:  [facility]   PHYSICIAN:  Corinna L. Lendell Caprice, MDDATE OF BIRTH:  [birth year]   DATE OF ADMISSION:  08/18/2008  DATE OF DISCHARGE:  08/22/2008                               DISCHARGE SUMMARY   DISCHARGE DIAGNOSES:  1. Right lower lobe community-acquired pneumonia.  2. Dehydration.  3. Hyperlipidemia.  4. Hypertension.  5. Anxiety.  6. History of resected brain tumor.  7. Anemia.  8. Deconditioning.   DISCHARGE MEDICATIONS:  1. Ceftin 500 mg p.o. b.i.d. until gone.  2. He may continue hydrocodone/APAP 5/500 one tablet p.o. q.8 h.      p.r.n. pain.  3. Simvastatin 20 mg a day.  4. Lisinopril and hydrochlorothiazide 20/25 mg a day.  5. Lorazepam 1 mg nightly as needed.   CONDITION:  Stable.   ACTIVITY:  Increase slowly.  Use walker.  Home physical therapy and  occupational therapy has been arranged.   DIET:  Should be cardiac.   FOLLOWUP:  Follow up with Dr. Tally Joe in 4 weeks.  He will need a  repeat chest x-ray in 4-6 weeks.   CONSULTATIONS:  None.   PROCEDURES:  None.   LABORATORY DATA:  On admission, his white blood cell count was 12,000  with 88% neutrophils, hemoglobin 11.6, hematocrit 34, otherwise  unremarkable.  Reticulocyte count normal.  PT/PTT normal.  Complete  metabolic panel on admission significant for a BUN of 27, creatinine  1.35, albumin 2.9, and calcium 11.  At discharge, his BUN is 11 and  calcium 10.  Iron is 16, TIBC 184, and ferritin 2018.  B12 folate  unremarkable.  Urinalysis showed 30 protein, 15 ketones, negative  nitrite, trace leukocyte esterase, 0-2 white cells, and 0-2 red cells.  Blood cultures negative.  Sputum culture negative.  Chest x-ray on  admission showed right lower lobe pneumonia and chronic lung changes.  EKG shows sinus tachycardia with a rate of 105 and  a PVC.   HISTORY AND HOSPITAL COURSE:  Mr. Oddo is a pleasant elderly black  male who presented with cough productive of yellow sputum for 5 days.  He had fevers, shortness of breath, myalgias, nausea, and weakness.  He  had a temperature 102.2, heart rate 118, normal oxygen saturation and  respiratory rate.  He had bibasilar rales.  No wheeze or rhonchi.  Chest  x-ray showed right lower lobe pneumonia.  The patient was admitted,  started on IV fluids, IV antibiotics, and supportive care.  His  leukocytosis resolved.  His dehydration resolved.  At the time of  discharge, he is feeling better.  He has no shortness of breath.  Physical Therapy and Occupational Therapy worked with the patient and he  is debilitated.  He will be sent home with a rolling walker, a 3-in-1  commode, and home physical therapy and occupational therapy.  He  received Rocephin and azithromycin while in the hospital which was  converted over to oral which he has tolerated.  Corinna L. Lendell Caprice, MD  Electronically Signed     CLS/MEDQ  D:  08/22/2008  T:  08/23/2008  Job:  [id number]

## 2010-09-18 NOTE — Assessment & Plan Note (Signed)
MEDICAL RECORD NUMBER:  [id number].   Richard Mack is back regarding his head pain. We injected extracranially  around the scar tissue with lidocaine. The patient had relief at the time of  the injection while in the office, although he states that later pain  symptoms returned. We started a trial of Lyrica, and apparently, he was  unable to tolerate this. The patient was a no show for his last visit here.  We tried valproic acid as well as for his pain, and he did not continue with  this. It is very difficult communicating consistently with this patient and  his wife. He does not take instructions very well. The patient is still  having problems with sleep and pain throughout the day. Pain is at the crown  of his head. He rates it as a 10/10 today.   REVIEW OF SYSTEMS:  The patient reports decreased taste. He does not report  any problems with constitutional, GI, GU, or cardiorespiratory symptoms  today.   SOCIAL HISTORY:  Without significant change. 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   PHYSICAL EXAMINATION:  VITAL SIGNS:  Blood pressure is 145/96, pulse 102,  respiratory rate 16. He is saturating 89% on room air.  GENERAL:  The patient is generally pleasant in no acute distress. He is  alert and oriented x3. Affect is bright and appropriate. Gait is stable.  Reflexes are 2+. Sensation is within normal limits. The patient shows good  coordination and 5/5 motor function.  HEART:  Regular rate and rhythm.  LUNGS:  Clear.  ABDOMEN:  Soft, nontender. The patient has continued tenderness along the  crown of his head. There is some tethering around the scarred area, and  there is pain with deep pressure. There was no frank allodynia.   ASSESSMENT:  Continued head pain related to questionable cyst and surgical  removal. There was some scar tissue there, but this pain continues to be  difficult to evaluate and treat. The patient was extremely poor historian  which does not help either.   PLAN:  1.  Will begin a trial of capsaicin cream t.i.d..  2.  Will initiate Ultram extended release 100 mg a day for 5 days and up to      200 mg daily. He uses Vicodin for breakthrough pain q.6-8h. p.r.n.  3.  Consider repeat injection although the patient was hesitant to do this      today.  4.  See the patient back in four to six weeks' time.      [name], M.D.  Electronically Signed     ZTS/MedQ  D:  04/21/2005 11:26:21  T:  04/22/2005 17:09:30  Job #:  [id number]   cc:   Casimiro Needle L. Thad Ranger, M.D.  Fax: [phone]   Meredith Staggers, M.D.  Fax: [phone]

## 2010-09-18 NOTE — Assessment & Plan Note (Signed)
DATE OF VISIT:  July 26, 2005.   Richard Mack is back regarding his cranial pain.  He has actually tolerated the  topical Elavil, Ketoprofen, and Baclofen cream we have prescribed.  He does  not like taking it at bedtime, says it tingles and keeps him awake, but he  has noticed some benefit during the day and the Vicodin helps his  breakthrough symptoms.  He does complain of some difficulty sleeping.  It is  more due restlessness than frank pain, per se.   REASON FOR ADMISSION:  The patient denies any new symptomatology today.  Other pertinent positives are listed above.   SOCIAL HISTORY:  The patient continues to work full time to part time.   PHYSICAL EXAMINATION:  VITAL SIGNS:  Blood pressure is 115/69, pulse 82,  respiratory rate, his saturation 99% on room air.  GENERAL:  The patient is pleasant and in no acute distress.  PAIN AND REHAB EVALUATION:  He is alert and oriented x3.  Affect is bright  and appropriate.  Gait is stable.  HEART:  Regular rate and rhythm.  LUNGS:  Clear.  ABDOMEN:  Soft and nontender.  HEAD:  Still somewhat sensitive to the touch, although lesser so than on  prior examinations.  No skin breakdown, erythema, or warmth is appreciated.   ASSESSMENT:  Continued scalp pain with neuropathic component.   PLAN:  1.  Continue amitriptyline, Ketoprofen, and Baclofen cream (2%/20%/2%).      Will change the schedule to the morning and before dinnertime on a daily      basis.  2.  I refilled Vicodin 5/500, #90.  3.  Will add Elavil 10 mg q.h.s. for sleep and for further pain control.  4.  Will see the patient back in three month's time.      [name], M.D.  Electronically Signed     ZTS/MedQ  D:  07/26/2005 12:26:21  T:  07/27/2005 20:45:06  Job #:  [id number]   cc:   Meredith Staggers, M.D.  Fax: [phone]
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
## 2010-09-18 NOTE — Group Therapy Note (Signed)
MEDICAL RECORD NUMBER:  [id number].   CHIEF COMPLAINT:  Headache.   HISTORY OF PRESENT ILLNESS:  This is a 77 year old African-American who  complains of headaches since 1973 or 1974. He talks about a cyst that was  removed from his head more recently by Dr. Gasper Sells. He states it was about  1978 (1998?) when he had this performed. The cyst appeared to be  superficial. He was not sure if bone was cut or not. The patient is an  EXTREMELY POOR HISTORIAN although he is very pleasant. The patient has been  seen by his family doctor as well as Kelli Hope for evaluation of this  headache, and no real clear diagnosis has been determined. The patient most  recently has been tried on Vicodin as well as Ultram, Neurontin, Relafen,  lidoderm patch, etc., for the headache without result. The patient reports  his pain is an 8 to 9/10. Describes the pain as sharp, stabbing, and  constant. Interferes with general activity, relations with others, and  enjoyment of life on a moderate level. The patient seems to be most  prominent at the Crown of his head. The area is in close proximity to his  scar from the cyst removal. The patient was seen by Dr. Clarisse Gouge as well  without remedy. The patient denies any blurred or double vision with this.  He has had no vertigo. He has no problems with any specific head/neck  movements or direct contact with scalp. He states that the pain seems to be  worse at night and then may get better as he goes to sleep. Then as he gets  up moving again, it seems to bother him. He does note that when he is  distracted and working it tends to decrease.   PAST MEDICAL HISTORY:  Positive for hypertension and high cholesterol as  well as the above surgeries. The patient had a MRI recently which was  negative for any intracranial or extracranial defects.   MEDICATIONS:  1.  Lisinopril 20 mg a day.  2.  Aciphex 20 mg a day.  3.  Relafen 100 mg 2 tablets a day.  4.  Aspirin 81 mg  a day.  5.  Restoril 15 mg p.r.n. nightly.  6.  Vicodin and tramadol p.r.n.   REVIEW OF SYSTEMS:  Negative for any neurologic or psychiatric problems  today. He does report elevated blood sugars at times. He denies diabetes.  The patient denies any GU, GI, or cardiorespiratory complaints today. Full  review of systems is in the written health and history section.   SOCIAL HISTORY:  The patient is married. Continues to work as a Merchandiser, retail.  He works 32 hours a week. He works at [employer] on [address].   ALLERGIES:  None.   FAMILY PHYSICIAN:  Dr. Francesca Oman.   PHYSICAL EXAMINATION:  GENERAL:  The patient is pleasant in no acute  distress. Blood pressure is 130/80, pulse 80, respiratory rate 16. He  saturating 98% on room air. The patient is pleasant in no acute distress. He  is alert and oriented x3. Affect is generally bright and appropriate. Gait  is stable.  HEART:  Regular rate and rhythm.  LUNGS:  Clear.  ABDOMEN:  Soft and nontender.   NEUROLOGICAL/SCALP EXAM: On cranial nerve examination, nerves 2-12 are  intact.  I found no nystagmus or gaze abnormalities today. He had full neck  range of motion in all plans without pain. We palpated over  the crown of his  skull and noted some tenderness but nothing much beyond his baseline pain.  There was some tethering around the scar area. No obvious cranial defects  are seen on the skull other than modest superficial scarring noted near the  crown of the head. Cognitively, the patient is normal. He is very attentive  and alert. He is illiterate and is slow to process and grasp abstract  thoughts in general.  Motor exam, coordination, and sensory exam within  normal limits.  Reflexes 2+.   ASSESSMENT:  1.  Headache (not otherwise specified). This appears to be superficially      mediated perhaps by some scarring as related to the periosteum or a      small peripheral nerve branch near the crown of  the head. His      presentation is very peculiar and is complicated by the fact that he is      a poor historian. He has little benefit with prior, appropriate      interventions most recently including  Neurontin and Lidoderm patches.   PLAN:  1.  After informed consent, we injected extracranially around the scar      tissue with 1.5 cc of 1% lidocaine. The patient had significant relief      of the headache/pain symptoms. He will attempt to track his      pain/symtpoms as the hours pass today. At next visit he will discuss the      course of pain relief after this injection.  Pain score upon leaving the      office was 4-5/10.  2.  Will begin him on a trial of Lyrica. We described the effects and side      effects today.  3.  Consider some massage type treatments over this scar tissue potentially.  4.  Will see him back in one month for followup treatment. May consider      addition of low dose of Kenalog to the injection mixture if he does in      fact have some sustained (hours) relief with the lidocaine.   I would like to thank [facility] for this referral.      [name], M.D.  Electronically Signed     ZTS/MedQ  D:  01/01/2005 14:00:30  T:  01/01/2005 16:37:58  Job #:  [id number]   cc:   Casimiro Needle L. Thad Ranger, M.D.  [address]  [address]  [place]  [place] [address]  Fax: [phone]   Meredith Staggers, M.D.  [address]  [place]  [place] [address]  Fax: [phone]

## 2010-09-18 NOTE — Assessment & Plan Note (Signed)
Richard Mack is back regarding his head pain.  He was unable to tolerate the Ultram  for various reasons, so he stopped this medication.  The Vicodin seems to be  the only thing that helps him.  The Capsaicin cream was of no benefit.  The  patient reports the pain today is a 3 out of 10 on average.  The last time I  saw him it was a 10 out of 10.  Sleep is poor.  The pain is intermittent  throughout the day and associated with no particular activity.   REVIEW OF SYSTEMS:  The patient has no new changes in his review of systems.  Other pertinent positives listed above.   SOCIAL HISTORY:  Without change.  He continues to work.   PHYSICAL EXAMINATION:  VITAL SIGNS:  Blood pressure is 144/66, pulse is 76,  respiratory rate 16.  He is sating 99% on room air.  GENERAL:  The patient is pleasant, no acute distress.  He is alert and  oriented x3.  Affect is bright.  Gait stable.  HEAD:  The patient remains sensitive to touch on the head.  Scalp is tender  along the crown with minimal  scar tissue palpated associated with  tenderness.  No breakdown is seen.  MUSCULOSKELETAL:  Coordination, reflexes, sensation are normal.  Muscle  strength is 5/5.  HEART:  Regular rate and rhythm.  LUNGS:  Clear.  ABDOMEN:  Soft nontender.   ASSESSMENT:  Continue scalp pain with neuropathic component.  There is a  question of whether there is some nerve tissue intertwined with scar tissue.   PLAN:  1.  Begin a trial of topical cream consisting of 2% Elavil, 20% ketoprofen,      and 2% baclofen.  This will be applied twice daily.  2.  I refilled Vicodin 5/500, one q.6h. p.r.n., #90.  3.  I will see the patient back in about two months' time.      [name], M.D.  Electronically Signed     ZTS/MedQ  D:  06/02/2005 12:00:51  T:  06/02/2005 14:07:15  Job #:  [id number]   cc:   Meredith Staggers, M.D.  Fax: [phone]

## 2010-12-07 ENCOUNTER — Ambulatory Visit: Payer: Medicare Other | Admitting: Endocrinology

## 2011-01-07 ENCOUNTER — Emergency Department (HOSPITAL_COMMUNITY)
Admission: EM | Admit: 2011-01-07 | Discharge: 2011-01-07 | Disposition: A | Discharge status: Home / Self Care | Payer: Medicare Other | Attending: Emergency Medicine | Admitting: Emergency Medicine

## 2011-01-07 ENCOUNTER — Emergency Department (HOSPITAL_COMMUNITY): Payer: Medicare Other

## 2011-01-07 DIAGNOSIS — R319 Hematuria, unspecified: Secondary | ICD-10-CM | POA: Insufficient documentation

## 2011-01-07 DIAGNOSIS — E7800 Pure hypercholesterolemia, unspecified: Secondary | ICD-10-CM | POA: Insufficient documentation

## 2011-01-07 DIAGNOSIS — R109 Unspecified abdominal pain: Secondary | ICD-10-CM | POA: Insufficient documentation

## 2011-01-07 DIAGNOSIS — M1990 Unspecified osteoarthritis, unspecified site: Secondary | ICD-10-CM | POA: Insufficient documentation

## 2011-01-07 DIAGNOSIS — I10 Essential (primary) hypertension: Secondary | ICD-10-CM | POA: Insufficient documentation

## 2011-01-07 DIAGNOSIS — K863 Pseudocyst of pancreas: Secondary | ICD-10-CM | POA: Insufficient documentation

## 2011-01-07 DIAGNOSIS — R10819 Abdominal tenderness, unspecified site: Secondary | ICD-10-CM | POA: Insufficient documentation

## 2011-01-07 DIAGNOSIS — F411 Generalized anxiety disorder: Secondary | ICD-10-CM | POA: Insufficient documentation

## 2011-01-07 DIAGNOSIS — K862 Cyst of pancreas: Secondary | ICD-10-CM | POA: Insufficient documentation

## 2011-01-07 DIAGNOSIS — M129 Arthropathy, unspecified: Secondary | ICD-10-CM | POA: Insufficient documentation

## 2011-01-07 LAB — BASIC METABOLIC PANEL WITH GFR
BUN: 18 mg/dL (ref 6–23)
CO2: 25 meq/L (ref 19–32)
Calcium: 11.6 mg/dL — ABNORMAL HIGH (ref 8.4–10.5)
Creatinine, Ser: 0.89 mg/dL (ref 0.50–1.35)
Glucose, Bld: 90 mg/dL (ref 70–99)

## 2011-01-07 LAB — DIFFERENTIAL
Basophils Absolute: 0 10*3/uL (ref 0.0–0.1)
Basophils Relative: 0 % (ref 0–1)
Eosinophils Absolute: 0 10*3/uL (ref 0.0–0.7)
Eosinophils Relative: 1 % (ref 0–5)
Lymphocytes Relative: 26 % (ref 12–46)
Lymphs Abs: 1.2 K/uL (ref 0.7–4.0)
Monocytes Absolute: 0.3 K/uL (ref 0.1–1.0)
Monocytes Relative: 8 % (ref 3–12)
Neutro Abs: 2.9 K/uL (ref 1.7–7.7)
Neutrophils Relative %: 66 % (ref 43–77)

## 2011-01-07 LAB — URINALYSIS, ROUTINE W REFLEX MICROSCOPIC
Bilirubin Urine: NEGATIVE
Glucose, UA: NEGATIVE mg/dL
Ketones, ur: NEGATIVE mg/dL
Leukocytes, UA: NEGATIVE
Nitrite: NEGATIVE
Protein, ur: NEGATIVE mg/dL
Specific Gravity, Urine: 1.009 (ref 1.005–1.030)
Urobilinogen, UA: 0.2 mg/dL (ref 0.0–1.0)
pH: 7 (ref 5.0–8.0)

## 2011-01-07 LAB — CBC
HCT: 33.3 % — ABNORMAL LOW (ref 39.0–52.0)
Hemoglobin: 11.6 g/dL — ABNORMAL LOW (ref 13.0–17.0)
MCH: 32.7 pg (ref 26.0–34.0)
MCHC: 34.8 g/dL (ref 30.0–36.0)
MCV: 93.8 fL (ref 78.0–100.0)
Platelets: 161 10*3/uL (ref 150–400)
RBC: 3.55 MIL/uL — ABNORMAL LOW (ref 4.22–5.81)
RDW: 11.3 % — ABNORMAL LOW (ref 11.5–15.5)
WBC: 4.5 10*3/uL (ref 4.0–10.5)

## 2011-01-07 LAB — BASIC METABOLIC PANEL
Chloride: 104 mEq/L (ref 96–112)
GFR calc Af Amer: >60 mL/min (ref >60)
GFR calc non Af Amer: >60 mL/min (ref >60)
Potassium: 4.1 mEq/L (ref 3.5–5.1)
Sodium: 138 mEq/L (ref 135–145)

## 2011-01-07 LAB — URINE MICROSCOPIC-ADD ON

## 2011-01-07 LAB — LIPASE, BLOOD: Lipase: 26 U/L (ref 11–59)

## 2011-01-07 MED ORDER — IOHEXOL 300 MG/ML  SOLN
80.0000 mL | Freq: Once | INTRAMUSCULAR | Status: AC | PRN
  Administered 2011-01-07: 80 mL via INTRAVENOUS

## 2011-01-08 LAB — URINE CULTURE
Colony Count: 50000
Culture  Setup Time: 201209061000

## 2011-01-21 ENCOUNTER — Other Ambulatory Visit: Payer: Self-pay | Admitting: Family Medicine

## 2011-01-21 DIAGNOSIS — K862 Cyst of pancreas: Secondary | ICD-10-CM

## 2011-01-27 ENCOUNTER — Ambulatory Visit
Admission: RE | Admit: 2011-01-27 | Discharge: 2011-01-27 | Disposition: A | Discharge status: Home / Self Care | Payer: Medicare Other | Source: Ambulatory Visit | Attending: Family Medicine | Admitting: Family Medicine

## 2011-01-27 DIAGNOSIS — K862 Cyst of pancreas: Secondary | ICD-10-CM

## 2011-01-27 MED ORDER — GADOBENATE DIMEGLUMINE 529 MG/ML IV SOLN
12.0000 mL | Freq: Once | INTRAVENOUS | Status: AC | PRN
  Administered 2011-01-27: 12 mL via INTRAVENOUS

## 2011-01-28 LAB — DIFFERENTIAL
Lymphs Abs: 1
Monocytes Relative: 10
Neutro Abs: 2.8
Neutrophils Relative %: 66

## 2011-01-28 LAB — POCT I-STAT, CHEM 8
BUN: 11
Chloride: 104
Creatinine, Ser: 1.2
Glucose, Bld: 96
Potassium: 4.1

## 2011-01-28 LAB — CBC
MCV: 93.4
RBC: 4.07 — ABNORMAL LOW
WBC: 4.2

## 2011-02-15 LAB — BASIC METABOLIC PANEL
BUN: 18
Chloride: 103
Glucose, Bld: 122 — ABNORMAL HIGH
Potassium: 4.1

## 2011-02-15 LAB — CBC
HCT: 37.1 — ABNORMAL LOW
MCV: 92.9
Platelets: 260
RDW: 11.9
WBC: 5.3

## 2011-07-09 ENCOUNTER — Encounter (HOSPITAL_COMMUNITY): Payer: Self-pay

## 2011-07-09 ENCOUNTER — Emergency Department (HOSPITAL_COMMUNITY)
Admission: EM | Admit: 2011-07-09 | Discharge: 2011-07-09 | Disposition: A | Discharge status: Home / Self Care | Payer: Medicare Other | Attending: Emergency Medicine | Admitting: Emergency Medicine

## 2011-07-09 ENCOUNTER — Emergency Department (HOSPITAL_COMMUNITY): Payer: Medicare Other

## 2011-07-09 ENCOUNTER — Other Ambulatory Visit: Payer: Self-pay

## 2011-07-09 ENCOUNTER — Encounter: Payer: Self-pay | Admitting: Neurology

## 2011-07-09 DIAGNOSIS — Z79899 Other long term (current) drug therapy: Secondary | ICD-10-CM | POA: Insufficient documentation

## 2011-07-09 DIAGNOSIS — R0602 Shortness of breath: Secondary | ICD-10-CM | POA: Insufficient documentation

## 2011-07-09 DIAGNOSIS — R51 Headache: Secondary | ICD-10-CM | POA: Insufficient documentation

## 2011-07-09 DIAGNOSIS — M25511 Pain in right shoulder: Secondary | ICD-10-CM

## 2011-07-09 DIAGNOSIS — R52 Pain, unspecified: Secondary | ICD-10-CM | POA: Insufficient documentation

## 2011-07-09 DIAGNOSIS — R079 Chest pain, unspecified: Secondary | ICD-10-CM | POA: Insufficient documentation

## 2011-07-09 DIAGNOSIS — R911 Solitary pulmonary nodule: Secondary | ICD-10-CM | POA: Insufficient documentation

## 2011-07-09 DIAGNOSIS — M25519 Pain in unspecified shoulder: Secondary | ICD-10-CM | POA: Insufficient documentation

## 2011-07-09 DIAGNOSIS — I10 Essential (primary) hypertension: Secondary | ICD-10-CM | POA: Insufficient documentation

## 2011-07-09 LAB — DIFFERENTIAL
Basophils Absolute: 0 10*3/uL (ref 0.0–0.1)
Basophils Relative: 0 % (ref 0–1)
Eosinophils Absolute: 0 10*3/uL (ref 0.0–0.7)
Eosinophils Relative: 1 % (ref 0–5)
Lymphocytes Relative: 19 % (ref 12–46)
Lymphs Abs: 0.8 10*3/uL (ref 0.7–4.0)
Monocytes Absolute: 0.4 10*3/uL (ref 0.1–1.0)
Monocytes Relative: 8 % (ref 3–12)
Neutro Abs: 3 10*3/uL (ref 1.7–7.7)
Neutrophils Relative %: 72 % (ref 43–77)

## 2011-07-09 LAB — BASIC METABOLIC PANEL WITH GFR
BUN: 18 mg/dL (ref 6–23)
CO2: 26 meq/L (ref 19–32)
Calcium: 11.5 mg/dL — ABNORMAL HIGH (ref 8.4–10.5)
Chloride: 105 meq/L (ref 96–112)
Creatinine, Ser: 1.02 mg/dL (ref 0.50–1.35)
GFR calc Af Amer: 78 mL/min — ABNORMAL LOW
GFR calc non Af Amer: 67 mL/min — ABNORMAL LOW
Glucose, Bld: 97 mg/dL (ref 70–99)
Potassium: 4.4 meq/L (ref 3.5–5.1)
Sodium: 140 meq/L (ref 135–145)

## 2011-07-09 LAB — CBC
HCT: 34.8 % — ABNORMAL LOW (ref 39.0–52.0)
MCHC: 33.6 g/dL (ref 30.0–36.0)
RDW: 11.1 % — ABNORMAL LOW (ref 11.5–15.5)

## 2011-07-09 LAB — TROPONIN I: Troponin I: <0.3 ng/mL

## 2011-07-09 MED ORDER — GABAPENTIN 300 MG PO CAPS: ORAL_CAPSULE | ORAL | Status: DC

## 2011-07-09 MED ORDER — FENTANYL CITRATE 0.05 MG/ML IJ SOLN
100.0000 ug | Freq: Once | INTRAMUSCULAR | Status: AC
  Administered 2011-07-09: 100 ug via INTRAMUSCULAR
  Filled 2011-07-09: qty 2

## 2011-07-09 NOTE — ED Notes (Signed)
Patient presents with headache and right shoulder pain x several years with worsening pain today. Patient has a history of surgery to right shoulder. Patient also reporting SOB and generalized body pain.

## 2011-07-09 NOTE — ED Notes (Signed)
Pt returned from xray

## 2011-07-09 NOTE — ED Provider Notes (Signed)
See prior note   Ward Givens, MD 07/09/11 [phone]

## 2011-07-09 NOTE — Discharge Instructions (Signed)
It is very important to followup with your primary care physician in the next one to two-week stay get followup imaging of your chest to further evaluate nodule seen in her left lung that have been present for multiple x-rays over the last 2 years as well as further evaluation of ongoing shortness of breath and shoulder pain. Followup with neurology referral for further evaluation and management of your daily chronic headaches or return to emergency department for any emergent changing or worsening of symptoms.  Start Gabapentin as directed taking 300mg  the first day, 300mg  by mouth twice the second day and then 300mg  three times at breakfast, lunch and dinner on the 3rd day staying on a course of 300mg  three times a day until follow up with neurologist.

## 2011-07-09 NOTE — ED Notes (Signed)
Pt reports headache for the last 30 years. Asked patient why he came today, pt reports headache worsening over the last several days. Pt reports percocet use since 1998. States it does nothing to help his pain. Pt reports headache is on the top of his head but occasionally moves to his forehead. Pt denies dizziness, nausea, vomting, double vision or blurred vision with headache.

## 2011-07-09 NOTE — ED Provider Notes (Signed)
History     CSN: [id number]  Arrival date & time 07/09/11  4098   First MD Initiated Contact with Patient 07/09/11 [phone]      Chief Complaint  Patient presents with  . Headache  . Shoulder Pain    (Consider location/radiation/quality/duration/timing/severity/associated sxs/prior treatment) HPI  Patient presents to emergency department complaining of headache, shoulder pain, and shortness of breath as well as generalized body pain. Patient states that in the 70s he had a "tumor taken out of my head" and since then has had daily chronic headaches. Patient states he has been seen by his primary care physician as well as emergency room physicians for similar chronic headaches. Patient was seen in emergency department in January 2012 complaining of chronic daily headaches as well as generalized body pain. At that time a CT scan of head had no acute findings. Patient also complaining of daily chronic right shoulder pain stating that he had surgery on his shoulder many years ago and since then has daily pain. Patient states pain is aggravated by lying on the shoulder and range of motion. Patient denies any new or different headache or shoulder pain symptoms. Patient is also complaining of generalized body pain as well as a 2 to 3 week history of increasing shortness of breath. Patient states shortness of breath worsens with exertion. Patient denies any associated chest pain with shortness of breath. He denies fevers, chills, cough, hemoptysis, orthopnea, PND, or lower extremity swelling. Patient states his primary care physician is a Dr. Tally Joe. Patient states that Dr. Azucena Cecil has been prescribing him Vicodin for his chronic headache and shoulder pain but states despite taking the medicine he has daily unchanging pain.  Past Medical History  Diagnosis Date  . Hypertension   . Brain tumor     Past Surgical History  Procedure Date  . Shoulder surgery     History reviewed. No pertinent family  history.  History  Substance Use Topics  . Smoking status: Former Games developer  . Smokeless tobacco: Not on file  . Alcohol Use: No      Review of Systems  All other systems reviewed and are negative.    Allergies  Review of patient's allergies indicates no known allergies.  Home Medications   Current Outpatient Rx  Name Route Sig Dispense Refill  . HYDROCODONE-ACETAMINOPHEN 5-500 MG PO TABS Oral Take 1 tablet by mouth every 6 (six) hours as needed.    Marland Kitchen LISINOPRIL-HYDROCHLOROTHIAZIDE 20-25 MG PO TABS Oral Take 1 tablet by mouth daily.    Marland Kitchen LORAZEPAM 1 MG PO TABS Oral Take 1 mg by mouth every 8 (eight) hours.    Marland Kitchen SIMVASTATIN 20 MG PO TABS Oral Take 20 mg by mouth every evening.      BP 118/71  Pulse 86  Temp(Src) 97.3 F (36.3 C) (Oral)  Resp 23  SpO2 100%  Physical Exam  Nursing note and vitals reviewed. Constitutional: He is oriented to person, place, and time. He appears well-developed and well-nourished. No distress.       Thin appearing  HENT:  Head: Normocephalic and atraumatic.  Eyes: Conjunctivae are normal.  Neck: Normal range of motion. Neck supple.  Cardiovascular: Normal rate, regular rhythm, normal heart sounds and intact distal pulses.  Exam reveals no gallop and no friction rub.   No murmur heard. Pulmonary/Chest: Effort normal and breath sounds normal. No respiratory distress. He has no wheezes. He has no rales. He exhibits no tenderness.  Abdominal: Bowel sounds are normal. He  exhibits no distension and no mass. There is no tenderness. There is no rebound and no guarding.  Musculoskeletal: Normal range of motion. He exhibits no edema and no tenderness.       Pain with FROM of right shoulder with crepitous but no erythema, swelling or skin changes. Good radial pulse and sensation of RUE. Normal reflexes.   Neurological: He is alert and oriented to person, place, and time. No cranial nerve deficit. Coordination normal.  Skin: Skin is warm and dry. No rash  noted. He is not diaphoretic. No erythema.  Psychiatric: He has a normal mood and affect.    ED Course  Procedures (including critical care time)  IM fentanyl   Date: 07/09/2011  Rate: 103  Rhythm: sinus tachycardia with premature supraventricular complexes and frequent PVCs  QRS Axis: normal  Intervals: normal  ST/T Wave abnormalities: normal  Conduction Disutrbances: none  Narrative Interpretation:   Old EKG Reviewed: non provocative compared to May 26, 2010, No significant changes noted     Labs Reviewed  CBC - Abnormal; Notable for the following:    RBC 3.71 (*)    Hemoglobin 11.7 (*)    HCT 34.8 (*)    RDW 11.1 (*)    All other components within normal limits  BASIC METABOLIC PANEL - Abnormal; Notable for the following:    Calcium 11.5 (*)    GFR calc non Af Amer 67 (*)    GFR calc Af Amer 78 (*)    All other components within normal limits  DIFFERENTIAL  TROPONIN I   Dg Chest 2 View  07/09/2011  *RADIOLOGY REPORT*  Clinical Data: Shortness of breath.  CHEST - 2 VIEW  Comparison: Portable chest to 09/10/2010.  CT chest 08/25/2009.  Findings: The heart size is normal.  Emphysematous changes are evident.  Nodular densities are present at the left upper lobe. This was previously examined with CT.  Recommendation for follow-up CT was made at that time.  No follow-up CT has been performed. There is no definite interval change.  IMPRESSION:  1.  Stable appearance of nodular densities at the left upper lobe. There was a nodular lesion on a previous CT scan. Follow-up CT scan was recommended at that time.  If no follow-up has been performed, follow-up CT is still recommended to assure stability of these lesions and more detailed fashion. 2.  Emphysema. 3.  No acute cardiopulmonary disease.  Original Report Authenticated By: Jamesetta Orleans. MATTERN, M.D.     1. Chronic headache   2. Right shoulder pain   3. Shortness of breath   4. Pulmonary nodule       MDM  An  approximate 20 year history of chronic headache and shoulder pain without any neuro focal findings or ataxia in ER. Patient is afebrile. No meningeal signs. No temporal artery tenderness or ropiness. Patient denies visual changes. Patient was given neurology followup. Patient also complaining of mild shortness breath denies any associated chest pain with no acute findings on chest x-ray and negative troponin EKG. Patient has ongoing pulmonary nodules been seen on numerous chest x-rays and I spoke at length with patient's wife about following up with primary care provider for further imaging. Patient voices understanding and is agreeable to plan.        Jenness Corner, [place] 07/09/11 1123

## 2011-07-09 NOTE — ED Provider Notes (Signed)
Patient relates he has had chronic headaches since 1998 when he had surgery for a brain tumor. He states he also had surgery in 1973. He states the headache has been across it for years and he's been on pain medication for years without relief. He states the pain is mainly on the top of his head and sometimes comes to his forehead. He states he used to see Dr. Thad Ranger of [facility] neurology but has not been seen there for at least 3 years. He relates also several years ago he did have something that sounded like acupuncture but states that did not help. He states the pain is sharp. Patient seems for a frustrated and dealing with his pain.  Patient is alert and cooperative, he has a very flat affect. His head was palpated there is no masses felt.  Medical screening examination/treatment/procedure(s) were conducted as a shared visit with non-physician practitioner(s) and myself.  I personally evaluated the patient during the encounter Devoria Albe, MD, Franz Dell, MD 07/09/11 [phone]

## 2011-07-22 ENCOUNTER — Other Ambulatory Visit (HOSPITAL_COMMUNITY): Payer: Self-pay | Admitting: Family Medicine

## 2011-07-22 DIAGNOSIS — R911 Solitary pulmonary nodule: Secondary | ICD-10-CM

## 2011-07-23 ENCOUNTER — Ambulatory Visit (HOSPITAL_COMMUNITY)
Admission: RE | Admit: 2011-07-23 | Discharge: 2011-07-23 | Disposition: A | Discharge status: Home / Self Care | Payer: Medicare Other | Source: Ambulatory Visit | Attending: Family Medicine | Admitting: Family Medicine

## 2011-07-23 DIAGNOSIS — R911 Solitary pulmonary nodule: Secondary | ICD-10-CM | POA: Insufficient documentation

## 2011-07-23 DIAGNOSIS — R0602 Shortness of breath: Secondary | ICD-10-CM | POA: Insufficient documentation

## 2011-07-23 DIAGNOSIS — R059 Cough, unspecified: Secondary | ICD-10-CM | POA: Insufficient documentation

## 2011-07-23 DIAGNOSIS — J984 Other disorders of lung: Secondary | ICD-10-CM | POA: Insufficient documentation

## 2011-07-23 DIAGNOSIS — K449 Diaphragmatic hernia without obstruction or gangrene: Secondary | ICD-10-CM | POA: Insufficient documentation

## 2011-07-23 DIAGNOSIS — I2510 Atherosclerotic heart disease of native coronary artery without angina pectoris: Secondary | ICD-10-CM | POA: Insufficient documentation

## 2011-07-23 DIAGNOSIS — R05 Cough: Secondary | ICD-10-CM | POA: Insufficient documentation

## 2011-07-23 MED ORDER — IOHEXOL 300 MG/ML  SOLN
80.0000 mL | Freq: Once | INTRAMUSCULAR | Status: AC | PRN
  Administered 2011-07-23: 80 mL via INTRAVENOUS

## 2011-08-06 ENCOUNTER — Encounter: Payer: Self-pay | Admitting: Neurology

## 2011-08-30 ENCOUNTER — Ambulatory Visit: Payer: Medicare Other | Admitting: Neurology

## 2011-09-05 ENCOUNTER — Encounter (HOSPITAL_COMMUNITY): Payer: Self-pay | Admitting: Emergency Medicine

## 2011-09-05 ENCOUNTER — Emergency Department (HOSPITAL_COMMUNITY)
Admission: EM | Admit: 2011-09-05 | Discharge: 2011-09-05 | Disposition: A | Discharge status: Home / Self Care | Payer: Medicare Other | Attending: Emergency Medicine | Admitting: Emergency Medicine

## 2011-09-05 DIAGNOSIS — T23262S Burn of second degree of back of left hand, sequela: Secondary | ICD-10-CM

## 2011-09-05 DIAGNOSIS — T23269A Burn of second degree of back of unspecified hand, initial encounter: Secondary | ICD-10-CM | POA: Insufficient documentation

## 2011-09-05 DIAGNOSIS — X131XXA Other contact with steam and other hot vapors, initial encounter: Secondary | ICD-10-CM | POA: Insufficient documentation

## 2011-09-05 DIAGNOSIS — I10 Essential (primary) hypertension: Secondary | ICD-10-CM | POA: Insufficient documentation

## 2011-09-05 DIAGNOSIS — Z87891 Personal history of nicotine dependence: Secondary | ICD-10-CM | POA: Insufficient documentation

## 2011-09-05 DIAGNOSIS — X12XXXA Contact with other hot fluids, initial encounter: Secondary | ICD-10-CM | POA: Insufficient documentation

## 2011-09-05 HISTORY — DX: Headache: R51

## 2011-09-05 HISTORY — DX: Pure hypercholesterolemia, unspecified: E78.00

## 2011-09-05 MED ORDER — HYDROCODONE-ACETAMINOPHEN 5-325 MG PO TABS
1.0000 | ORAL_TABLET | Freq: Once | ORAL | Status: AC
  Administered 2011-09-05: 1 via ORAL
  Filled 2011-09-05: qty 1

## 2011-09-05 MED ORDER — TETANUS-DIPHTHERIA TOXOIDS TD 5-2 LFU IM INJ
0.5000 mL | INJECTION | Freq: Once | INTRAMUSCULAR | Status: AC
  Administered 2011-09-05: 0.5 mL via INTRAMUSCULAR
  Filled 2011-09-05: qty 0.5

## 2011-09-05 MED ORDER — SILVER SULFADIAZINE 1 % EX CREA: TOPICAL_CREAM | Freq: Every day | CUTANEOUS | Status: DC

## 2011-09-05 MED ORDER — HYDROCODONE-ACETAMINOPHEN 5-325 MG PO TABS: 1.0000 | ORAL_TABLET | ORAL | Status: AC | PRN

## 2011-09-05 MED ORDER — SENNOSIDES-DOCUSATE SODIUM 8.6-50 MG PO TABS: 1.0000 | ORAL_TABLET | Freq: Every day | ORAL | Status: DC

## 2011-09-05 MED ORDER — SILVER SULFADIAZINE 1 % EX CREA
TOPICAL_CREAM | Freq: Once | CUTANEOUS | Status: AC
  Administered 2011-09-05: 17:00:00 via TOPICAL
  Filled 2011-09-05: qty 85
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
## 2011-09-05 NOTE — Discharge Instructions (Signed)
You may gently wash your hands with soap and water.  Gently dry it before placing the Silvadene cream on it every day.  Wrap it with a gauze dressing after placing the cream on it.  Please make an appointment to see your doctor this week in 2-3 days for reevaluation of your hand.  Burn Care Your skin is a natural barrier to infection. It is the largest organ of your body. Burns damage this natural protection. To help prevent infection, it is very important to follow your caregiver's instructions in the care of your burn. Burns are classified as:  First degree. There is only redness of the skin (erythema). No scarring is expected.   Second degree. There is blistering of the skin. Scarring may occur with deeper burns.   Third degree. All layers of the skin are injured, and scarring is expected.  HOME CARE INSTRUCTIONS   Wash your hands well before changing your bandage.   Change your bandage as often as directed by your caregiver.   Remove the old bandage. If the bandage sticks, you may soak it off with cool, clean water.   Cleanse the burn thoroughly but gently with mild soap and water.   Pat the area dry with a clean, dry cloth.   Apply a thin layer of antibacterial cream to the burn.   Apply a clean bandage as instructed by your caregiver.   Keep the bandage as clean and dry as possible.   Elevate the affected area for the first 24 hours, then as instructed by your caregiver.   Only take over-the-counter or prescription medicines for pain, discomfort, or fever as directed by your caregiver.  SEEK IMMEDIATE MEDICAL CARE IF:   You develop excessive pain.   You develop redness, tenderness, swelling, or red streaks near the burn.   The burned area develops yellowish-white fluid (pus) or a bad smell.   You have a fever.  MAKE SURE YOU:   Understand these instructions.   Will watch your condition.   Will get help right away if you are not doing well or get worse.  Document  Released: 04/19/2005 Document Revised: 04/08/2011 Document Reviewed: 09/09/2010 [facility] Patient Information 2012 [place], [place].

## 2011-09-20 ENCOUNTER — Encounter: Payer: Self-pay | Admitting: Neurology

## 2011-09-20 ENCOUNTER — Ambulatory Visit (INDEPENDENT_AMBULATORY_CARE_PROVIDER_SITE_OTHER): Payer: Medicare Other | Admitting: Neurology

## 2011-09-20 VITALS — BP 122/60 | HR 84 | Wt 138.0 lb

## 2011-09-20 DIAGNOSIS — R51 Headache: Secondary | ICD-10-CM

## 2011-09-20 MED ORDER — AMITRIPTYLINE HCL 25 MG PO TABS: ORAL_TABLET | ORAL | Status: DC

## 2011-09-20 NOTE — Progress Notes (Signed)
Dear Dr. Azucena Cecil,  Thank you for having me see Richard Mack in consultation today at [facility] for his problem with chronic headaches.  As you may recall, he is a 78 y.o. year old male with a history of what sounds like excision of subcutaneous cysts of the scalp in the 1970s as well as in 1998.  Since the event in 1998 he has had persistent pains of the head that have been getting worse over the last several years.  These are poorly described but typically affect the top of his head but sometimes bifrontally.  It sounds like lying down makes the pain worse.  He says he does not have an appetite because of the headaches.  He denies jaw claudication.  They are without photo or phonophobia.  They may be pounding or throbbing.  No nausea or vomiting.  He is not able to recall it but apparently he has had extensive workup and treatments for his headache.  This has included local nerve blocks, Vicodin, Ultram, Neurontin, Relafen and lidoderm patch.  He has been seen by Dr. Clarisse Gouge as well as Dr. Thad Ranger at [facility].  He had local injections by Dr. Riley Kill.  Past Medical History  Diagnosis Date  . Hypertension   . Brain tumor   . Hypercholesteremia   . Headache   - uncertain as to whether he had brain tumor or superficial cysts.  I think these were superficial cysts given the lack of skull defect on CT. - no history of headaches when he was young.  Past Surgical History  Procedure Date  . Shoulder surgery     right    History   Social History  . Marital Status: Married    Spouse Name: N/A    Number of Children: N/A  . Years of Education: N/A   Social History Main Topics  . Smoking status: Former Smoker    Quit date: 09/19/1976  . Smokeless tobacco: Never Used  . Alcohol Use: No  . Drug Use: No  . Sexually Active: None   Other Topics Concern  . None   Social History Narrative  . None  - rare caffeine use - does drink mountain dew but sounds like 20oz bottle lasts 2-3  days.  Family Hx:  mother had headaches late in life.  Current Outpatient Prescriptions on File Prior to Visit  Medication Sig Dispense Refill  . HYDROcodone-acetaminophen (VICODIN) 5-500 MG per tablet Take 1 tablet by mouth every 6 (six) hours as needed.      Marland Kitchen lisinopril-hydrochlorothiazide (PRINZIDE,ZESTORETIC) 20-25 MG per tablet Take 1 tablet by mouth daily.      Marland Kitchen LORazepam (ATIVAN) 1 MG tablet Take 1 mg by mouth every 8 (eight) hours.      . senna-docusate (SENOKOT-S) 8.6-50 MG per tablet Take 1 tablet by mouth daily.  14 tablet  0  . simvastatin (ZOCOR) 20 MG tablet Take 20 mg by mouth every evening.      Marland Kitchen acetaminophen (TYLENOL) 500 MG tablet Take 500 mg by mouth every 6 (six) hours as needed. For pain      .      .      . silver sulfADIAZINE (SILVADENE) 1 % cream Apply topically daily. Apply to your burn on your left hand daily.  50 g  0    No Known Allergies    ROS:  13 systems were reviewed and are notable for chest pain and shortness of breath, joint pain, difficulty with balance  All other review of systems are unremarkable.   Examination:  Filed Vitals:   09/20/11 0856  BP: 122/60  Pulse: 84  Weight: 138 lb (62.596 kg)     In general,  thin appearing older man who appears his age.  Cardiovascular: The patient has a regular rate and rhythm and no carotid bruits.  Fundoscopy:  Disks are flat. Vessel caliber within normal limits.  Mental status:   The patient is oriented to person, place and time. Recent and remote memory are intact. Attention span and concentration are normal. Language including repetition, naming, following commands are intact. Fund of knowledge of current and historical events, as well as vocabulary are normal.  Cranial Nerves: Pupils are equally round and reactive to light. Arcus. Visual fields full to confrontation. Extraocular movements are intact without nystagmus. Facial sensation and muscles of mastication are intact. Muscles of  facial expression are symmetric. Hearing decreased to bilateral finger rub. Tongue protrusion, uvula, palate midline.  Shoulder shrug intact  Motor:  The patient has decreased bulk particularly in his hands(++arthritic changes) and normal tone, no pronator drift.  There are no adventitious movements.  5/5 muscle strength bilaterally.  Reflexes:   Biceps  Triceps Brachioradialis Knee Ankle  Right 2+  2+  2+   2+ 0  Left  2+  2+  2+   2+ 0  Toes down  Coordination:  Normal finger to nose.    Sensation is decreased to temperature and vibration in the feet.  Gait and Station are unsteady.  His right foot is slightly inturned.  Romberg is negative   Impression/Recs: 1.  Chronic headache - Obviously chronic daily headache, however, difficulty to tell what the exact mechanism is here.  However, I think medication overuse is likely a factor as the patient has never not taken hydrocodone.  I have asked him to stop the hydrocodone with the warning the headaches are likely to get worse for several weeks, but then will get better.  I have also started him on low dose Elavil 12.5->25 qhs for headache prevention.  I don't think these headaches are being driven by his neck as he lacks neck pain and I don't think they represent a malignant process given their chronicity.     We will see the patient back in 2 months.  Thank you for having [place] see Richard Mack in consultation.  Feel free to contact me with any questions.  Lupita Raider Modesto Charon, MD [facility], [place] [address] [place], [place] [address] Phone: [phone] Fax: [phone].

## 2011-09-21 ENCOUNTER — Telehealth: Payer: Self-pay | Admitting: Neurology

## 2011-09-21 NOTE — Telephone Encounter (Signed)
Pt is experiencing bad headaches since taking the Elavil prescribed to him yesterday 09/20/2011. He isn't sure he is going to be able to continue taking it. Is there something else he can try?

## 2011-09-21 NOTE — Telephone Encounter (Signed)
Called and spoke with the patient as well as his wife. He reports a real "bad HA since I took that pill the doctor gave me." The patient was seen yesterday for chronic daily HA more than likely from medication overuse. His wife also got on the phone and I tried to explain to her it was the Oxycodone that was the problem and that he needed to stop taking that as it was actually making his HA worse. Advised to continue the Elavil as prescribed (took 1/2 tab last night). Told them that the HA were likely to get worse for a couple of weeks before they get better. I am not sure the patient or his wife understood what I was trying to explain to them. I told them that I would let Dr. Modesto Charon know his situation and if I had anything else to share with them I would call and let them know. She was ok with this plan. **Dr. Modesto Charon, please advise? Thank you.

## 2011-09-22 ENCOUNTER — Encounter: Payer: Self-pay | Admitting: Neurology

## 2011-09-22 NOTE — Telephone Encounter (Signed)
I agree with you

## 2011-11-23 ENCOUNTER — Ambulatory Visit: Payer: Medicare Other | Admitting: Neurology

## 2011-12-27 ENCOUNTER — Emergency Department (HOSPITAL_COMMUNITY): Payer: Medicare Other

## 2011-12-27 ENCOUNTER — Encounter (HOSPITAL_COMMUNITY): Payer: Self-pay | Admitting: *Deleted

## 2011-12-27 ENCOUNTER — Emergency Department (HOSPITAL_COMMUNITY)
Admission: EM | Admit: 2011-12-27 | Discharge: 2011-12-27 | Disposition: A | Discharge status: Home / Self Care | Payer: Medicare Other | Attending: Emergency Medicine | Admitting: Emergency Medicine

## 2011-12-27 DIAGNOSIS — Z79899 Other long term (current) drug therapy: Secondary | ICD-10-CM | POA: Insufficient documentation

## 2011-12-27 DIAGNOSIS — R51 Headache: Secondary | ICD-10-CM | POA: Insufficient documentation

## 2011-12-27 DIAGNOSIS — I10 Essential (primary) hypertension: Secondary | ICD-10-CM | POA: Insufficient documentation

## 2011-12-27 DIAGNOSIS — G8929 Other chronic pain: Secondary | ICD-10-CM | POA: Insufficient documentation

## 2011-12-27 MED ORDER — OXYCODONE-ACETAMINOPHEN 5-325 MG PO TABS: 1.0000 | ORAL_TABLET | Freq: Four times a day (QID) | ORAL | Status: AC | PRN

## 2011-12-27 MED ORDER — OXYCODONE-ACETAMINOPHEN 5-325 MG PO TABS
1.0000 | ORAL_TABLET | Freq: Once | ORAL | Status: AC
  Administered 2011-12-27: 1 via ORAL
  Filled 2011-12-27: qty 1

## 2011-12-27 NOTE — ED Provider Notes (Signed)
Medical screening examination/treatment/procedure(s) were conducted as a shared visit with non-physician practitioner(s) and myself.  I personally evaluated the patient during the encounter  Cheri Guppy, MD 12/27/11 1520

## 2011-12-27 NOTE — ED Notes (Signed)
Pt here POV with spouse, and pt complaining of having a headache since 1998.  Pt advises he had a cyst on his head removed in 1998 and has had a headache since.

## 2011-12-27 NOTE — ED Provider Notes (Signed)
History     CSN: [id number]  Arrival date & time 12/27/11  0816   First MD Initiated Contact with Patient 12/27/11 [phone]      Chief Complaint  Patient presents with  . Headache    (Consider location/radiation/quality/duration/timing/severity/associated sxs/prior treatment) HPI Comments: Zahid Carneiro presents with complaint of chronic headache which has been present for many years,  But progressively getting worse.  His pain is nearly constant,  And localizes to the right lateral and posterior head.  He reports having these headaches since he had a "cyst" removed from his head in 1998.  His pcp has him on hydrocodone for chronic headache pain,  But he feels this makes his symptoms worse.  He denies nausea, vomiting, chills, fever, weakness or dizziness but does report decreased appetite because pain "takes his appetite. "    The history is provided by the patient and the spouse.    Past Medical History  Diagnosis Date  . Hypertension   . Brain tumor   . Hypercholesteremia   . Headache     Past Surgical History  Procedure Date  . Shoulder surgery     right    History reviewed. No pertinent family history.  History  Substance Use Topics  . Smoking status: Former Smoker    Quit date: 09/19/1976  . Smokeless tobacco: Never Used  . Alcohol Use: No      Review of Systems  Constitutional: Negative for fever.  HENT: Negative for congestion, sore throat and neck pain.   Eyes: Negative.   Respiratory: Negative for chest tightness and shortness of breath.   Cardiovascular: Negative for chest pain.  Gastrointestinal: Negative for nausea and abdominal pain.  Genitourinary: Negative.   Musculoskeletal: Negative for joint swelling and arthralgias.  Skin: Negative.  Negative for rash and wound.  Neurological: Positive for headaches. Negative for dizziness, weakness, light-headedness and numbness.  Hematological: Negative.   Psychiatric/Behavioral: Negative.     Allergies    Review of patient's allergies indicates no known allergies.  Home Medications   Current Outpatient Rx  Name Route Sig Dispense Refill  . HYDROCODONE-ACETAMINOPHEN 5-500 MG PO TABS Oral Take 1 tablet by mouth 3 (three) times daily as needed. For pain    . LISINOPRIL-HYDROCHLOROTHIAZIDE 20-25 MG PO TABS Oral Take 0.5 tablets by mouth daily.     Marland Kitchen LORAZEPAM 1 MG PO TABS Oral Take 1 mg by mouth at bedtime as needed. For sleep    . SIMVASTATIN 20 MG PO TABS Oral Take 20 mg by mouth every evening.    . OXYCODONE-ACETAMINOPHEN 5-325 MG PO TABS Oral Take 1 tablet by mouth every 6 (six) hours as needed for pain. 20 tablet 0    BP 113/65  Pulse 65  Temp 97.8 F (36.6 C) (Oral)  Resp 14  Ht 5\' 6"  (1.676 m)  Wt 130 lb (58.968 kg)  BMI 20.98 kg/m2  SpO2 100%  Physical Exam  Nursing note and vitals reviewed. Constitutional: He is oriented to person, place, and time. He appears well-developed and well-nourished.       Uncomfortable appearing  HENT:  Head: Normocephalic and atraumatic.  Mouth/Throat: Oropharynx is clear and moist.       No ttp at temples.  Eyes: EOM are normal. Pupils are equal, round, and reactive to light.  Neck: Normal range of motion. Neck supple.  Cardiovascular: Normal rate and normal heart sounds.   Pulmonary/Chest: Effort normal.  Abdominal: Soft. There is no tenderness.  Musculoskeletal: He exhibits no  tenderness.       Arthritic changes noted bilateral hands.  Lymphadenopathy:    He has no cervical adenopathy.  Neurological: He is alert and oriented to person, place, and time. He has normal strength. No sensory deficit. Gait normal. GCS eye subscore is 4. GCS verbal subscore is 5. GCS motor subscore is 6.       Normal heel-shin, normal rapid alternating movements. Cranial nerves III-XII intact.  No pronator drift.  Skin: Skin is warm and dry. No rash noted.  Psychiatric: He has a normal mood and affect. His speech is normal and behavior is normal. Thought  content normal. Cognition and memory are normal.    ED Course  Procedures (including critical care time)  Labs Reviewed - No data to display Ct Head Wo Contrast  12/27/2011  *RADIOLOGY REPORT*  Clinical Data: Headache.  CT HEAD WITHOUT CONTRAST  Technique:  Contiguous axial images were obtained from the base of the skull through the vertex without contrast.  Comparison: CT 09/10/2010  Findings: Mild atrophy and mild chronic microvascular ischemic change in the white matter.  Negative for acute infarct.  Negative for hemorrhage or mass.  No change from the prior study.  Calvarium is intact.  IMPRESSION: Mild atrophy and mild chronic microvascular ischemia.  No acute abnormality and no change from the  prior scan.   Original Report Authenticated By: Camelia Phenes, M.D.      1. Chronic headache disorder       MDM  Review of chart reveals pt was seen by Dr Modesto Charon of neurology 5/13 who felt headaches were related to chronic opioid use.  He was placed on elavil and asked to stop using hydrocodone.  Pt states this made his headaches worse,  So stopped.  He was to follow up with Dr Modesto Charon 7/13 which has not happened yet.  Encouraged to do so.  In the interim,  Will prescribe short course of oxycodone in place of hydrocodone.          Burgess Amor, [place] 12/27/11 1035

## 2011-12-27 NOTE — ED Provider Notes (Signed)
Medical screening examination/treatment/procedure(s) were conducted as a shared visit with non-physician practitioner(s) and myself.  I personally evaluated the patient during the encounter 76 y male with chronic ha presents to ed.   No other sxs. Normal neuro exam.   No signs cns or systemic infx.   Ct no stroke or tumor.   Will tx with analgesics and refer back to his neurologist.  Cheri Guppy, MD 12/27/11 1034

## 2012-05-17 ENCOUNTER — Emergency Department (HOSPITAL_COMMUNITY): Payer: Medicare Other

## 2012-05-17 ENCOUNTER — Emergency Department (HOSPITAL_COMMUNITY)
Admission: EM | Admit: 2012-05-17 | Discharge: 2012-05-17 | Disposition: A | Discharge status: Home / Self Care | Payer: Medicare Other | Attending: Emergency Medicine | Admitting: Emergency Medicine

## 2012-05-17 ENCOUNTER — Encounter (HOSPITAL_COMMUNITY): Payer: Self-pay | Admitting: *Deleted

## 2012-05-17 ENCOUNTER — Other Ambulatory Visit: Payer: Self-pay

## 2012-05-17 DIAGNOSIS — D496 Neoplasm of unspecified behavior of brain: Secondary | ICD-10-CM | POA: Insufficient documentation

## 2012-05-17 DIAGNOSIS — R509 Fever, unspecified: Secondary | ICD-10-CM | POA: Insufficient documentation

## 2012-05-17 DIAGNOSIS — E7800 Pure hypercholesterolemia, unspecified: Secondary | ICD-10-CM | POA: Insufficient documentation

## 2012-05-17 DIAGNOSIS — Z87891 Personal history of nicotine dependence: Secondary | ICD-10-CM | POA: Insufficient documentation

## 2012-05-17 DIAGNOSIS — R52 Pain, unspecified: Secondary | ICD-10-CM | POA: Insufficient documentation

## 2012-05-17 DIAGNOSIS — G8929 Other chronic pain: Secondary | ICD-10-CM | POA: Insufficient documentation

## 2012-05-17 DIAGNOSIS — Z79899 Other long term (current) drug therapy: Secondary | ICD-10-CM | POA: Insufficient documentation

## 2012-05-17 DIAGNOSIS — I10 Essential (primary) hypertension: Secondary | ICD-10-CM | POA: Insufficient documentation

## 2012-05-17 LAB — CBC WITH DIFFERENTIAL/PLATELET
Basophils Absolute: 0 10*3/uL (ref 0.0–0.1)
Basophils Relative: 0 % (ref 0–1)
Hemoglobin: 12.4 g/dL — ABNORMAL LOW (ref 13.0–17.0)
MCHC: 34.6 g/dL (ref 30.0–36.0)
Monocytes Relative: 10 % (ref 3–12)
Neutro Abs: 3.9 10*3/uL (ref 1.7–7.7)
Neutrophils Relative %: 75 % (ref 43–77)
RDW: 11.5 % (ref 11.5–15.5)
WBC: 5.2 10*3/uL (ref 4.0–10.5)

## 2012-05-17 LAB — COMPREHENSIVE METABOLIC PANEL
AST: 17 U/L (ref 0–37)
Albumin: 4.2 g/dL (ref 3.5–5.2)
Alkaline Phosphatase: 81 U/L (ref 39–117)
Chloride: 101 mEq/L (ref 96–112)
Potassium: 4.2 mEq/L (ref 3.5–5.1)
Total Bilirubin: 0.8 mg/dL (ref 0.3–1.2)

## 2012-05-17 MED ORDER — HYDROMORPHONE HCL PF 2 MG/ML IJ SOLN
2.0000 mg | Freq: Once | INTRAMUSCULAR | Status: AC
  Administered 2012-05-17: 2 mg via INTRAMUSCULAR
  Filled 2012-05-17: qty 1

## 2012-05-17 MED ORDER — OXYCODONE-ACETAMINOPHEN 5-325 MG PO TABS: 1.0000 | ORAL_TABLET | Freq: Four times a day (QID) | ORAL | Status: DC | PRN

## 2012-05-17 NOTE — ED Notes (Signed)
Pt is here with fever, body aches, headache, reports knot on back of head, shortness of breath, chest pain ( heart hurts sometimes)

## 2012-05-17 NOTE — ED Provider Notes (Signed)
History     CSN: [id number]  Arrival date & time 05/17/12  6295   First MD Initiated Contact with Patient 05/17/12 1104      Chief Complaint  Patient presents with  . Headache  . Fever  . Generalized Body Aches    (Consider location/radiation/quality/duration/timing/severity/associated sxs/prior treatment) HPI Pt with chronic daily headaches and R shoulder pain for 15 years has been on hydrocodone for many years but states 'it hasn't ever helped'. He reports worsening aching R posterior headache the last few days with diffuse body aches, feeling sick and subjective fever at home.    Past Medical History  Diagnosis Date  . Hypertension   . Brain tumor   . Hypercholesteremia   . Headache     Past Surgical History  Procedure Date  . Shoulder surgery     right    No family history on file.  History  Substance Use Topics  . Smoking status: Former Smoker    Quit date: 09/19/1976  . Smokeless tobacco: Never Used  . Alcohol Use: No      Review of Systems All other systems reviewed and are negative except as noted in HPI.   Allergies  Review of patient's allergies indicates no known allergies.  Home Medications   Current Outpatient Rx  Name  Route  Sig  Dispense  Refill  . HYDROCODONE-ACETAMINOPHEN 5-500 MG PO TABS   Oral   Take 1 tablet by mouth 3 (three) times daily as needed. For pain         . LISINOPRIL-HYDROCHLOROTHIAZIDE 20-25 MG PO TABS   Oral   Take 0.5 tablets by mouth daily.          Marland Kitchen LORAZEPAM 1 MG PO TABS   Oral   Take 1 mg by mouth at bedtime as needed. For sleep         . SIMVASTATIN 20 MG PO TABS   Oral   Take 20 mg by mouth every evening.           BP 129/52  Pulse 82  Temp 97.3 F (36.3 C) (Oral)  Resp 22  SpO2 100%  Physical Exam  Nursing note and vitals reviewed. Constitutional: He is oriented to person, place, and time. He appears well-developed and well-nourished.  HENT:  Head: Normocephalic and atraumatic.    Eyes: EOM are normal. Pupils are equal, round, and reactive to light.  Neck: Normal range of motion. Neck supple.  Cardiovascular: Normal rate, normal heart sounds and intact distal pulses.   Pulmonary/Chest: Effort normal and breath sounds normal.  Abdominal: Bowel sounds are normal. He exhibits no distension. There is no tenderness.  Musculoskeletal: Normal range of motion. He exhibits no edema and no tenderness.  Neurological: He is alert and oriented to person, place, and time. He has normal strength. No cranial nerve deficit or sensory deficit.  Skin: Skin is warm and dry. No rash noted.  Psychiatric: He has a normal mood and affect.    ED Course  Procedures (including critical care time)  Labs Reviewed  CBC WITH DIFFERENTIAL - Abnormal; Notable for the following:    RBC 3.76 (*)     Hemoglobin 12.4 (*)     HCT 35.8 (*)     All other components within normal limits  COMPREHENSIVE METABOLIC PANEL - Abnormal; Notable for the following:    Calcium 11.9 (*)     GFR calc non Af Amer 75 (*)     GFR calc Af Amer 87 (*)  All other components within normal limits  POCT I-STAT TROPONIN I   Dg Chest 2 View  05/17/2012  *RADIOLOGY REPORT*  Clinical Data: Headache, fever, generalized body aches  CHEST - 2 VIEW  Comparison: CT chest 07/23/2011; chest x-ray 03/08 01:13  Findings: The lungs are well-aerated and free from pulmonary edema, focal airspace consolidation or pulmonary nodule.  Nodular densities in the left apex and left upper lobe (projecting behind the medial clavicle and anterior first rib) are unchanged compared to prior studies.  Cardiac and mediastinal contours are within normal limits.  No pneumothorax, or pleural effusion. No acute osseous findings.  IMPRESSION:  No acute cardiopulmonary disease.   Original Report Authenticated By: Malachy Moan, M.D.      No diagnosis found.    MDM    Date: 05/17/2012  Rate: 83  Rhythm: normal sinus rhythm  QRS Axis: normal   Intervals: normal  ST/T Wave abnormalities: normal  Conduction Disutrbances: none  Narrative Interpretation: unremarkable  Labs and imaging ordered in triage are normal. Exacerbation of multiple chronic pain issues without evidence of any acute medical problem.           Richard Mack B. Bernette Mayers, MD 05/17/12 1257

## 2012-05-17 NOTE — ED Notes (Addendum)
Pt c/o headache, fever. "a bump on my head for 3 mo," and pt states "im just very sick." Pt wife states pt has cough and cp, pt denies. Pt in NAD at present, afebrile. Pt told MD he hurts in his head and his shoulder. Pt states "i think i have a tumor growing on the back of my head." Pt states he had shoulder surgery in 1998 and hx of recurrent headaches. Pt wife states he has cp "been going on for a while now." Denies n/v/d or abd pain. Pt states the hydrocodone he had been taking since 1998 does not help him and "it's getting worser."

## 2012-09-10 ENCOUNTER — Emergency Department (HOSPITAL_COMMUNITY)
Admission: EM | Admit: 2012-09-10 | Discharge: 2012-09-10 | Disposition: A | Discharge status: Home / Self Care | Payer: Medicare Other | Attending: Emergency Medicine | Admitting: Emergency Medicine

## 2012-09-10 ENCOUNTER — Encounter (HOSPITAL_COMMUNITY): Payer: Self-pay | Admitting: *Deleted

## 2012-09-10 ENCOUNTER — Emergency Department (HOSPITAL_COMMUNITY): Payer: Medicare Other

## 2012-09-10 DIAGNOSIS — G8928 Other chronic postprocedural pain: Secondary | ICD-10-CM | POA: Insufficient documentation

## 2012-09-10 DIAGNOSIS — Z9889 Other specified postprocedural states: Secondary | ICD-10-CM | POA: Insufficient documentation

## 2012-09-10 DIAGNOSIS — Z87891 Personal history of nicotine dependence: Secondary | ICD-10-CM | POA: Insufficient documentation

## 2012-09-10 DIAGNOSIS — M25511 Pain in right shoulder: Secondary | ICD-10-CM

## 2012-09-10 DIAGNOSIS — R059 Cough, unspecified: Secondary | ICD-10-CM | POA: Insufficient documentation

## 2012-09-10 DIAGNOSIS — E7800 Pure hypercholesterolemia, unspecified: Secondary | ICD-10-CM | POA: Insufficient documentation

## 2012-09-10 DIAGNOSIS — Z79899 Other long term (current) drug therapy: Secondary | ICD-10-CM | POA: Insufficient documentation

## 2012-09-10 DIAGNOSIS — Z8669 Personal history of other diseases of the nervous system and sense organs: Secondary | ICD-10-CM | POA: Insufficient documentation

## 2012-09-10 DIAGNOSIS — I10 Essential (primary) hypertension: Secondary | ICD-10-CM | POA: Insufficient documentation

## 2012-09-10 DIAGNOSIS — R05 Cough: Secondary | ICD-10-CM | POA: Insufficient documentation

## 2012-09-10 DIAGNOSIS — M25519 Pain in unspecified shoulder: Secondary | ICD-10-CM | POA: Insufficient documentation

## 2012-09-10 DIAGNOSIS — R51 Headache: Secondary | ICD-10-CM | POA: Insufficient documentation

## 2012-09-10 LAB — BASIC METABOLIC PANEL
BUN: 16 mg/dL (ref 6–23)
Chloride: 103 mEq/L (ref 96–112)
Creatinine, Ser: 1.16 mg/dL (ref 0.50–1.35)
GFR calc Af Amer: 66 mL/min — ABNORMAL LOW (ref >90)
GFR calc non Af Amer: 57 mL/min — ABNORMAL LOW (ref >90)

## 2012-09-10 LAB — CBC WITH DIFFERENTIAL/PLATELET
Basophils Relative: 1 % (ref 0–1)
Eosinophils Absolute: 0.1 10*3/uL (ref 0.0–0.7)
HCT: 38.1 % — ABNORMAL LOW (ref 39.0–52.0)
Hemoglobin: 13.2 g/dL (ref 13.0–17.0)
MCH: 32.8 pg (ref 26.0–34.0)
MCHC: 34.6 g/dL (ref 30.0–36.0)
MCV: 94.5 fL (ref 78.0–100.0)
Monocytes Absolute: 0.5 10*3/uL (ref 0.1–1.0)
Monocytes Relative: 14 % — ABNORMAL HIGH (ref 3–12)

## 2012-09-10 MED ORDER — TRAMADOL HCL 50 MG PO TABS: 50.0000 mg | ORAL_TABLET | Freq: Four times a day (QID) | ORAL | Status: DC | PRN

## 2012-09-10 MED ORDER — TRAMADOL HCL 50 MG PO TABS
50.0000 mg | ORAL_TABLET | Freq: Once | ORAL | Status: AC
  Administered 2012-09-10: 50 mg via ORAL
  Filled 2012-09-10: qty 1

## 2012-09-10 NOTE — ED Notes (Signed)
PT is here with headache on right side and has a knot to posterior head.  Pt complains of right shoulder pain that he states is chronic.  Pt wants to be check for pneumonia.  Pt states sometimes his heart hurts him sometimes.  No shortness of breath.

## 2012-09-10 NOTE — ED Provider Notes (Addendum)
History     CSN: [id number]  Arrival date & time 09/10/12  4098   First MD Initiated Contact with Patient 09/10/12 [phone]      Chief Complaint  Patient presents with  . Shoulder Pain  . Headache    (Consider location/radiation/quality/duration/timing/severity/associated sxs/prior treatment) Patient is a 79 y.o. male presenting with shoulder pain and headaches. The history is provided by the patient.  Shoulder Pain Associated symptoms include headaches. Pertinent negatives include no chest pain, no abdominal pain and no shortness of breath.  Headache Associated symptoms: cough   Associated symptoms: no abdominal pain, no back pain, no pain, no fever, no nausea, no neck pain, no numbness, no sore throat and no vomiting   pt c/o pain to area of right posterior scalp/head and right shoulder. States right shoulder pain since surgery for same in 1990s.  Pt denies recent injury or abrupt change in this pain. States head pain also chronic without acute change. States his hydrocodone is no longer working. Mild, dull, non radiating. No loc. No change in vision or speech. No numbness/weakness. No problems w balance or coordination. No neck pain or stiffness. Also notes recent onset non productive cough. Requests cxr to check for pna. Denies sore throat or other uri c/o. No fever or chills. No cp or sob. Denies leg pain or swelling. No orthopnea. Pt denies recent change in meds or new meds.     Past Medical History  Diagnosis Date  . Hypertension   . Brain tumor   . Hypercholesteremia   . Headache     Past Surgical History  Procedure Laterality Date  . Shoulder surgery      right    No family history on file.  History  Substance Use Topics  . Smoking status: Former Smoker    Quit date: 09/19/1976  . Smokeless tobacco: Never Used  . Alcohol Use: No      Review of Systems  Constitutional: Negative for fever.  HENT: Negative for sore throat and neck pain.   Eyes: Negative for  pain and visual disturbance.  Respiratory: Positive for cough. Negative for shortness of breath.   Cardiovascular: Negative for chest pain.  Gastrointestinal: Negative for nausea, vomiting and abdominal pain.  Genitourinary: Negative for flank pain.  Musculoskeletal: Negative for back pain.  Skin: Negative for rash.  Neurological: Positive for headaches. Negative for weakness and numbness.  Hematological: Does not bruise/bleed easily.  Psychiatric/Behavioral: Negative for confusion.    Allergies  Review of patient's allergies indicates no known allergies.  Home Medications   Current Outpatient Rx  Name  Route  Sig  Dispense  Refill  . lisinopril-hydrochlorothiazide (PRINZIDE,ZESTORETIC) 20-25 MG per tablet   Oral   Take 0.5 tablets by mouth daily.          Marland Kitchen LORazepam (ATIVAN) 1 MG tablet   Oral   Take 1 mg by mouth at bedtime as needed. For sleep         . oxyCODONE-acetaminophen (PERCOCET/ROXICET) 5-325 MG per tablet   Oral   Take 1-2 tablets by mouth every 6 (six) hours as needed for pain.   20 tablet   0   . polyethylene glycol powder (GLYCOLAX/MIRALAX) powder   Oral   Take 17 g by mouth daily as needed. For constipation         . simvastatin (ZOCOR) 20 MG tablet   Oral   Take 20 mg by mouth every evening.  BP 104/61  Pulse 83  Temp(Src) 98.1 F (36.7 C) (Temporal)  Resp 18  SpO2 98%  Physical Exam  Nursing note and vitals reviewed. Constitutional: He is oriented to person, place, and time. He appears well-developed and well-nourished. No distress.  HENT:  Head: Atraumatic.  Nose: Nose normal.  Mouth/Throat: Oropharynx is clear and moist.  No scalp sts or abscess. Mild area tenderness right posterior scalp without skin or st changes.   Eyes: Conjunctivae are normal. Pupils are equal, round, and reactive to light.  Neck: Neck supple. No tracheal deviation present.  Cardiovascular: Normal rate, regular rhythm, normal heart sounds and  intact distal pulses.   Pulmonary/Chest: Effort normal and breath sounds normal. No accessory muscle usage. No respiratory distress. He exhibits no tenderness.  Non productive cough  Abdominal: Soft. Bowel sounds are normal. He exhibits no distension. There is no tenderness.  Musculoskeletal: Normal range of motion. He exhibits no edema and no tenderness.  Healed surgical scar right shoulder without sign of infection. Good rom. No focal bony tenderness. No deformity. Distal pulses palp.   Neurological: He is alert and oriented to person, place, and time.  Motor intact bil. Steady gait.   Skin: Skin is warm and dry. He is not diaphoretic.  Psychiatric: He has a normal mood and affect.    ED Course  Procedures (including critical care time)  Results for orders placed during the hospital encounter of 09/10/12  CBC WITH DIFFERENTIAL      Result Value Range   WBC 3.8 (*) 4.0 - 10.5 K/uL   RBC 4.03 (*) 4.22 - 5.81 MIL/uL   Hemoglobin 13.2  13.0 - 17.0 g/dL   HCT 16.1 (*) 09.6 - 04.5 %   MCV 94.5  78.0 - 100.0 fL   MCH 32.8  26.0 - 34.0 pg   MCHC 34.6  30.0 - 36.0 g/dL   RDW 40.9  81.1 - 91.4 %   Platelets 178  150 - 400 K/uL   Neutrophils Relative 58  43 - 77 %   Neutro Abs 2.2  1.7 - 7.7 K/uL   Lymphocytes Relative 27  12 - 46 %   Lymphs Abs 1.0  0.7 - 4.0 K/uL   Monocytes Relative 14 (*) 3 - 12 %   Monocytes Absolute 0.5  0.1 - 1.0 K/uL   Eosinophils Relative 2  0 - 5 %   Eosinophils Absolute 0.1  0.0 - 0.7 K/uL   Basophils Relative 1  0 - 1 %   Basophils Absolute 0.0  0.0 - 0.1 K/uL  BASIC METABOLIC PANEL      Result Value Range   Sodium 138  135 - 145 mEq/L   Potassium 4.1  3.5 - 5.1 mEq/L   Chloride 103  96 - 112 mEq/L   CO2 28  19 - 32 mEq/L   Glucose, Bld 133 (*) 70 - 99 mg/dL   BUN 16  6 - 23 mg/dL   Creatinine, Ser 7.82  0.50 - 1.35 mg/dL   Calcium 95.6 (*) 8.4 - 10.5 mg/dL   GFR calc non Af Amer 57 (*) >90 mL/min   GFR calc Af Amer 66 (*) >90 mL/min  POCT I-STAT  TROPONIN I      Result Value Range   Troponin i, poc 0.00  0.00 - 0.08 ng/mL   Comment 3            Dg Chest 2 View  09/10/2012  *RADIOLOGY REPORT*  Clinical Data: Chest  pain  CHEST - 2 VIEW  Comparison: 05/17/2012 and prior chest radiographs.  07/23/2011 CT  Findings: The cardiomediastinal silhouette is stable. Left apical nodular opacities are unchanged from 2012. There is no evidence of airspace disease, pleural effusion, pneumothorax, pulmonary edema or acute bony abnormality.  IMPRESSION: No evidence of acute cardiopulmonary disease.   Original Report Authenticated By: Harmon Pier, M.D.        MDM  Labs sent from triage.  Ultram for pain.    Date: 09/10/2012  Rate: 81  Rhythm: normal sinus rhythm  QRS Axis: normal  Intervals: normal  ST/T Wave abnormalities: normal  Conduction Disutrbances:none  Narrative Interpretation:   Old EKG Reviewed: unchanged  Labs appears c/w priors/baseline. cxr neg acute.  Pt notes chronic shoulder and head/scap pain without acute change - prior cts of head noted for same, negative acute.   No increased wob.  Denies any cp or discomfort.  Pt appears stable for d/c. rec close pcp f/u.  Pt requests rx for pain for home.        Suzi Roots, MD 09/10/12 [phone]

## 2012-10-03 ENCOUNTER — Ambulatory Visit (INDEPENDENT_AMBULATORY_CARE_PROVIDER_SITE_OTHER): Payer: Medicare Other | Admitting: Neurology

## 2012-10-03 ENCOUNTER — Encounter: Payer: Self-pay | Admitting: Neurology

## 2012-10-03 VITALS — Ht 66.0 in | Wt 145.0 lb

## 2012-10-03 DIAGNOSIS — I10 Essential (primary) hypertension: Secondary | ICD-10-CM

## 2012-10-03 DIAGNOSIS — D496 Neoplasm of unspecified behavior of brain: Secondary | ICD-10-CM | POA: Insufficient documentation

## 2012-10-03 MED ORDER — DIVALPROEX SODIUM ER 500 MG PO TB24: 500.0000 mg | ORAL_TABLET | Freq: Every day | ORAL | Status: DC

## 2012-10-03 NOTE — Progress Notes (Signed)
History of present illness:  Mr. Bonura is a 79 years old right-handed African American male, accompanied by his wife, referred by his primary care physician Dr. Marica Otter for evaluation of headaches  The patient and his wife are poor historian, history is by reviewing the referring note, and previous office note,  He had a past medical history of hypertension, well controlled with Lisinopril, he was a patient of Dr. Deneen Harts, last office visit was May 2011, he is followed by our office for headaches, there was also lipsmacking movement since February 2008, MRI of the brain only showed a mild abnormality, he was treated with Lyrica for his headaches, but he could not tolerate,  Today he reported 3 brain surgery, first one was 75, last one was 1998, but he could not elaborate on details, reported that he had a cyst removed, he has been having headaches since 1998, daily basis, bilateral frontal, pressure, he has been taking tramadol, hydrocodone as needed basis, which does not to take away his headaches, he has difficulty sleeping, because headache waking up when he was lying down, he reported only slept for 3 hours each day,  But he is not a reliable historian, today's Mini-Mental status is only 18 out of 30, 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   Constitutional:   N/A Cardiovascular:  N/A Ear/Nose/Throat:  N/A Skin:  N/A Eyes: N/A Respiratory: N/A Gastroitestinal: N/A    Hematology/Lymphatic:  N/A Endocrine:  N/A Musculoskeletal: joint swelling, aching muscles Allergy/Immunology: running nose Neurological: headache, numbness, weakness, dizziness, restless legs Psychiatric:    N/A  PHYSICAL EXAMINATOINS:  Generalized: In no acute distress  Neck: Supple, no carotid bruits   Cardiac: Regular rate rhythm  Pulmonary: Clear to auscultation bilaterally  Musculoskeletal: No deformity  Neurological examination  Mentation: tired looking, endenture, MMSE 18/30, he is not oriented to date, could not write world backward, missed 3/3 recall, he could not read, copy design, or write sentence.  Cranial nerve II-XII: Pupils were equal round reactive to light extraocular movements were full, visual field were full on confrontational test. facial sensation and strength were normal. hearing was intact to finger rubbing bilaterally. Uvula tongue midline.  head turning and shoulder shrug and were normal and symmetric.Tongue protrusion into cheek strength was normal.  Motor: normal tone, bulk and strength.  Sensory: Intact to fine touch, pinprick, preserved vibratory sensation, and proprioception at toes.  Coordination: Normal finger to nose, heel-to-shin bilaterally there was no truncal ataxia  Gait: Mild atalgic, cautious gait,  Romberg signs: Negative  Deep tendon reflexes: Brachioradialis 2/2, biceps 2/2, triceps 2/2, patellar 2/2, Achilles trac, plantar responses were flexor bilaterally.  Assessment plan, 79 years old gentleman, with history of dementia, presenting with chronic headaches, CAT scan showed no acute lesions,  1 tension headache vs. medicine rebound headache with long-term chronic analgesic use.  2 Laboratory evaluation, including TSH, inflammatory markers.  3 Depakote every night as preventive measure.  4  return to clinic in 3 months with Eber Jones

## 2012-10-04 ENCOUNTER — Telehealth: Payer: Self-pay | Admitting: Neurology

## 2012-10-04 LAB — TSH: TSH: 0.883 u[IU]/mL (ref 0.450–4.500)

## 2012-10-04 LAB — C-REACTIVE PROTEIN: CRP: 0.5 mg/L (ref 0.0–4.9)

## 2012-10-04 LAB — VITAMIN B12: Vitamin B-12: 484 pg/mL (ref 211–946)

## 2012-10-04 NOTE — Telephone Encounter (Signed)
Patient stated he took one pill and it made his headache worse and he was very nausea, the pain has not gone away. Patient wife is very concerned and would like  a call back from Dr. Terrace Arabia. Please call home phone# [phone]

## 2012-10-04 NOTE — Telephone Encounter (Signed)
Called all the patient number's didn't get a answer they just kept ringing I will try again later.

## 2012-10-05 ENCOUNTER — Telehealth: Payer: Self-pay | Admitting: Neurology

## 2012-10-05 MED ORDER — CYCLOBENZAPRINE HCL 5 MG PO TABS: 5.0000 mg | ORAL_TABLET | Freq: Every day | ORAL | Status: DC

## 2012-10-05 NOTE — Telephone Encounter (Signed)
I have called him, He tried Depakote 500mg  qhs, he could not tolerate it, he could not sleep, I have advised him to stop Depakote.  I called in Flexeril 5mg  po qhs

## 2012-10-05 NOTE — Telephone Encounter (Signed)
Patient called stating Divalproex is making him sick and his wife thinks the dosage is too high. Patient would like to speak with physician.

## 2012-10-09 NOTE — Progress Notes (Signed)
Quick Note:  Called and spoke to patients wife told ger normal labs. ______

## 2012-10-20 ENCOUNTER — Encounter: Payer: Self-pay | Admitting: Neurology

## 2012-12-09 ENCOUNTER — Encounter (HOSPITAL_COMMUNITY): Payer: Self-pay | Admitting: *Deleted

## 2012-12-09 ENCOUNTER — Emergency Department (HOSPITAL_COMMUNITY): Payer: Medicare Other

## 2012-12-09 ENCOUNTER — Emergency Department (HOSPITAL_COMMUNITY)
Admission: EM | Admit: 2012-12-09 | Discharge: 2012-12-09 | Disposition: A | Discharge status: Home / Self Care | Payer: Medicare Other | Attending: Emergency Medicine | Admitting: Emergency Medicine

## 2012-12-09 DIAGNOSIS — Z8669 Personal history of other diseases of the nervous system and sense organs: Secondary | ICD-10-CM | POA: Insufficient documentation

## 2012-12-09 DIAGNOSIS — E7800 Pure hypercholesterolemia, unspecified: Secondary | ICD-10-CM | POA: Insufficient documentation

## 2012-12-09 DIAGNOSIS — I10 Essential (primary) hypertension: Secondary | ICD-10-CM | POA: Insufficient documentation

## 2012-12-09 DIAGNOSIS — M25519 Pain in unspecified shoulder: Secondary | ICD-10-CM | POA: Insufficient documentation

## 2012-12-09 DIAGNOSIS — Z87891 Personal history of nicotine dependence: Secondary | ICD-10-CM | POA: Insufficient documentation

## 2012-12-09 DIAGNOSIS — G8929 Other chronic pain: Secondary | ICD-10-CM | POA: Insufficient documentation

## 2012-12-09 DIAGNOSIS — R51 Headache: Secondary | ICD-10-CM | POA: Insufficient documentation

## 2012-12-09 DIAGNOSIS — Z9889 Other specified postprocedural states: Secondary | ICD-10-CM | POA: Insufficient documentation

## 2012-12-09 LAB — CBC WITH DIFFERENTIAL/PLATELET
Eosinophils Relative: 2 % (ref 0–5)
Lymphocytes Relative: 25 % (ref 12–46)
Lymphs Abs: 0.8 10*3/uL (ref 0.7–4.0)
MCV: 93.7 fL (ref 78.0–100.0)
Neutro Abs: 2.1 10*3/uL (ref 1.7–7.7)
Platelets: 161 10*3/uL (ref 150–400)
RBC: 3.67 MIL/uL — ABNORMAL LOW (ref 4.22–5.81)
WBC: 3.3 10*3/uL — ABNORMAL LOW (ref 4.0–10.5)

## 2012-12-09 LAB — COMPREHENSIVE METABOLIC PANEL
ALT: 16 U/L (ref 0–53)
Alkaline Phosphatase: 72 U/L (ref 39–117)
CO2: 28 mEq/L (ref 19–32)
Calcium: 11.2 mg/dL — ABNORMAL HIGH (ref 8.4–10.5)
Chloride: 100 mEq/L (ref 96–112)
GFR calc Af Amer: 66 mL/min — ABNORMAL LOW (ref >90)
GFR calc non Af Amer: 57 mL/min — ABNORMAL LOW (ref >90)
Glucose, Bld: 104 mg/dL — ABNORMAL HIGH (ref 70–99)
Potassium: 4.4 mEq/L (ref 3.5–5.1)
Sodium: 136 mEq/L (ref 135–145)
Total Bilirubin: 0.7 mg/dL (ref 0.3–1.2)

## 2012-12-09 MED ORDER — FENTANYL CITRATE 0.05 MG/ML IJ SOLN: 25.0000 ug | INTRAMUSCULAR | Status: DC | PRN

## 2012-12-09 MED ORDER — OXYCODONE-ACETAMINOPHEN 5-325 MG PO TABS: 1.0000 | ORAL_TABLET | Freq: Four times a day (QID) | ORAL | Status: DC | PRN

## 2012-12-09 MED ORDER — OXYCODONE-ACETAMINOPHEN 5-325 MG PO TABS
1.0000 | ORAL_TABLET | Freq: Once | ORAL | Status: AC
  Administered 2012-12-09: 1 via ORAL
  Filled 2012-12-09: qty 1

## 2012-12-09 NOTE — ED Notes (Signed)
Pt and family  Reports a 30 year Hx of H/A following a brain tumor. Pt also has RT shoulder pain.

## 2012-12-09 NOTE — ED Provider Notes (Signed)
TIME SEEN: 8:02 AM  CHIEF COMPLAINT: Occipital headache, right shoulder pain  HPI: Patient is a 79 y.o. AAM with h/o chronic right shoulder pain after a shoulder surgery in the 1990s as well as chronic occipital headaches since he had a cyst removed in 1990s as well. Patient reports to the emergency department with complaints of exacerbation of pain in both areas. He denies any new injury. Denies any fever, numbness, tingling, focal weakness. Denies any chest pain or shortness of breath. No vomiting or diarrhea. He states that his primary care physician gives him hydrocodone for his pain which did not work today. On review of patient's records, he has been in the emergency department once every 6 months for similar complaints with negative workup. Last head CT was over a year ago.  ROS: See HPI Constitutional: no fever  Eyes: no drainage  ENT: no runny nose   Cardiovascular:  no chest pain  Resp: no SOB  GI: no vomiting GU: no dysuria Integumentary: no rash  Allergy: no hives  Musculoskeletal: no leg swelling  Neurological: no slurred speech ROS otherwise negative  PAST MEDICAL HISTORY/PAST SURGICAL HISTORY:  Past Medical History  Diagnosis Date  . Hypertension   . Brain tumor   . Hypercholesteremia   . Headache(784.0)     MEDICATIONS:  Prior to Admission medications   Medication Sig Start Date End Date Taking? Authorizing Provider  cyclobenzaprine (FLEXERIL) 5 MG tablet Take 1 tablet (5 mg total) by mouth at bedtime. 10/05/12   Levert Feinstein, MD  divalproex (DEPAKOTE ER) 500 MG 24 hr tablet Take 1 tablet (500 mg total) by mouth daily. 10/03/12   Levert Feinstein, MD  HYDROcodone-acetaminophen (NORCO/VICODIN) 5-325 MG per tablet Take 1 tablet by mouth every 8 (eight) hours as needed for pain.    Historical Provider, MD  lisinopril-hydrochlorothiazide (PRINZIDE,ZESTORETIC) 20-25 MG per tablet Take 0.5 tablets by mouth daily.     Historical Provider, MD  LORazepam (ATIVAN) 1 MG tablet Take 1 mg by  mouth at bedtime as needed and may repeat dose one time if needed for anxiety.    Historical Provider, MD  polyethylene glycol (MIRALAX / GLYCOLAX) packet Take 17 g by mouth daily.    Historical Provider, MD  simvastatin (ZOCOR) 20 MG tablet Take 20 mg by mouth every evening.    Historical Provider, MD  traMADol (ULTRAM) 50 MG tablet Take 1 tablet (50 mg total) by mouth every 6 (six) hours as needed for pain. 09/10/12   Suzi Roots, MD    ALLERGIES:  No Known Allergies  SOCIAL HISTORY:  History  Substance Use Topics  . Smoking status: Former Smoker    Quit date: 09/19/1976  . Smokeless tobacco: Never Used  . Alcohol Use: No    FAMILY HISTORY: Family History  Problem Relation Age of Onset  . Appendicitis Father     EXAM: BP 123/66  Pulse 74  Temp(Src) 97.6 F (36.4 C) (Oral)  Resp 22  SpO2 100% CONSTITUTIONAL: Alert and oriented and responds appropriately to questions. Well-appearing; well-nourished HEAD: Normocephalic, mild tenderness to palpation of his posterior scalp with no lesions noted  EYES: Conjunctivae clear, PERRL, extraocular movements intact  ENT: normal nose; no rhinorrhea; moist mucous membranes; pharynx without lesions noted NECK: Supple, no meningismus, no LAD , no midline spinal tenderness, step-off or deformity CARD: RRR; S1 and S2 appreciated; no murmurs, no clicks, no rubs, no gallops RESP: Normal chest excursion without splinting or tachypnea; breath sounds clear and equal bilaterally; no  wheezes, no rhonchi, no rales,  ABD/GI: Normal bowel sounds; non-distended; soft, non-tender, no rebound, no guarding BACK:  The back appears normal and is non-tender to palpation, there is no CVA tenderness EXT:  patient has limited flexion of his right shoulder which is chronic, no bony deformity, no skin changes, 2+ DP pulses bilaterally, sensation to light touch intact diffusely, otherwise Normal ROM in all joints; non-tender to palpation; no edema; normal capillary  refill; no cyanosis    SKIN: Normal color for age and race; warm NEURO: Moves all extremities equally, strength 5/5 in all 4 extremities, sensation to light touch intact diffusely, cranial nerves II through XII intact, no dysmetria finger-nose testing  PSYCH: The patient's mood and manner are appropriate. Grooming and personal hygiene are appropriate.  MEDICAL DECISION MAKING:  Patient with chronic headache and shoulder pain who presents for acute exacerbation. Given patient is 79 years old and is a poor historian, will obtain basic labs as well as cardiac labs to rule out this is his anginal equivalent. We'll also repeat a CT scan of his head he has not had one in over a year. Will give IV analgesics. If workup negative, anticipate discharge home with PCP followup. Patient and wife verbalized understanding and comfort with this plan.   ED PROGRESS:    Date: 12/09/2012 8:35 AM   Rate: 67  Rhythm: normal sinus rhythm, occasional PAC  QRS Axis: normal  Intervals: normal  ST/T Wave abnormalities: Marland Kitchen Minimal ST elevation in lead 2 and aVF without to standing appearance, does not meet STEMI criteria, no changes in reciprocal leads  Conduction Disutrbances: none  Narrative Interpretation: PACs, early repolarization in the inferior leads, no signs of ischemic change   11:46 AM  Pt states his symptoms have improved. His workup  has been negative. I have low suspicion for ACS, carotid dissection, intracranial hemorrhage as a source of his symptoms given patient's symptoms seem musculoskeletal in nature and are reproducible with palpation and have been present constantly for 30 years.  I have discussed with the patient that I do not feel we will find the cause of his pain today since he has had these symptoms for 30 years. We'll send him with a small prescription for oxycodone. We'll have him followup with his PCP. Given usual and customary return precautions.  Layla Maw Lafern Brinkley, DO 12/09/12 1545

## 2013-01-04 ENCOUNTER — Ambulatory Visit: Payer: Medicare Other | Admitting: Nurse Practitioner

## 2013-04-25 ENCOUNTER — Emergency Department (HOSPITAL_COMMUNITY)
Admission: EM | Admit: 2013-04-25 | Discharge: 2013-04-25 | Disposition: A | Discharge status: Home / Self Care | Payer: Medicare Other | Attending: Emergency Medicine | Admitting: Emergency Medicine

## 2013-04-25 ENCOUNTER — Emergency Department (HOSPITAL_COMMUNITY): Payer: Medicare Other

## 2013-04-25 ENCOUNTER — Encounter (HOSPITAL_COMMUNITY): Payer: Self-pay | Admitting: Emergency Medicine

## 2013-04-25 DIAGNOSIS — I10 Essential (primary) hypertension: Secondary | ICD-10-CM | POA: Insufficient documentation

## 2013-04-25 DIAGNOSIS — Z79899 Other long term (current) drug therapy: Secondary | ICD-10-CM | POA: Insufficient documentation

## 2013-04-25 DIAGNOSIS — R51 Headache: Secondary | ICD-10-CM | POA: Insufficient documentation

## 2013-04-25 DIAGNOSIS — Z87891 Personal history of nicotine dependence: Secondary | ICD-10-CM | POA: Insufficient documentation

## 2013-04-25 DIAGNOSIS — E7800 Pure hypercholesterolemia, unspecified: Secondary | ICD-10-CM | POA: Insufficient documentation

## 2013-04-25 DIAGNOSIS — G8929 Other chronic pain: Secondary | ICD-10-CM | POA: Insufficient documentation

## 2013-04-25 MED ORDER — METOCLOPRAMIDE HCL 10 MG PO TABS
10.0000 mg | ORAL_TABLET | Freq: Once | ORAL | Status: AC
  Administered 2013-04-25: 10 mg via ORAL
  Filled 2013-04-25: qty 1

## 2013-04-25 MED ORDER — HYDROCODONE-ACETAMINOPHEN 5-325 MG PO TABS: 1.0000 | ORAL_TABLET | Freq: Three times a day (TID) | ORAL | Status: DC | PRN

## 2013-04-25 MED ORDER — KETOROLAC TROMETHAMINE 60 MG/2ML IM SOLN
60.0000 mg | Freq: Once | INTRAMUSCULAR | Status: AC
  Administered 2013-04-25: 60 mg via INTRAMUSCULAR
  Filled 2013-04-25: qty 2

## 2013-04-25 MED ORDER — DIPHENHYDRAMINE HCL 25 MG PO CAPS
25.0000 mg | ORAL_CAPSULE | Freq: Once | ORAL | Status: AC
  Administered 2013-04-25: 25 mg via ORAL
  Filled 2013-04-25: qty 1

## 2013-04-25 MED ORDER — OXYCODONE-ACETAMINOPHEN 5-325 MG PO TABS
1.0000 | ORAL_TABLET | Freq: Once | ORAL | Status: AC
  Administered 2013-04-25: 1 via ORAL
  Filled 2013-04-25: qty 1

## 2013-04-25 NOTE — ED Notes (Signed)
Pt ambulated with out assitance

## 2013-04-25 NOTE — ED Notes (Signed)
Reports having headache for extended amount of time. Reports having "a knot on back of head" and he thinks that's the cause of his headache. Denies any n/v. No acute distress noted.

## 2013-04-25 NOTE — ED Provider Notes (Signed)
CSN: [id number]     Arrival date & time 04/25/13  4098 History   First MD Initiated Contact with Patient 04/25/13 [phone]     Chief Complaint  Patient presents with  . Headache   (Consider location/radiation/quality/duration/timing/severity/associated sxs/prior Treatment) Patient is a 79 y.o. male presenting with headaches. The history is provided by the patient.  Headache Pain location:  Occipital Quality:  Dull Radiates to:  Does not radiate Severity currently:  8/10 Severity at highest:  8/10 Onset quality:  Gradual Duration: years. Timing:  Constant Progression:  Worsening Chronicity:  Chronic Similar to prior headaches: yes   Context: not caffeine and not coughing   Context comment:  Spontaneously Relieved by: hydrocodone. Worsened by:  Nothing tried Associated symptoms: no abdominal pain, no blurred vision, no dizziness, no fever, no loss of balance, no numbness, no photophobia, no seizures, no syncope, no visual change and no vomiting     Past Medical History  Diagnosis Date  . Hypertension   . Brain tumor   . Hypercholesteremia   . YNWGNFAO(130.8)    Past Surgical History  Procedure Laterality Date  . Shoulder surgery      right  . Brain surgery      x3   Family History  Problem Relation Age of Onset  . Appendicitis Father    History  Substance Use Topics  . Smoking status: Former Smoker    Quit date: 09/19/1976  . Smokeless tobacco: Never Used  . Alcohol Use: No    Review of Systems  Constitutional: Negative for fever.  Eyes: Negative for blurred vision and photophobia.  Cardiovascular: Negative for syncope.  Gastrointestinal: Negative for vomiting and abdominal pain.  Neurological: Positive for headaches. Negative for dizziness, seizures, numbness and loss of balance.  All other systems reviewed and are negative.    Allergies  Review of patient's allergies indicates no known allergies.  Home Medications   Current Outpatient Rx  Name  Route   Sig  Dispense  Refill  . HYDROcodone-acetaminophen (NORCO/VICODIN) 5-325 MG per tablet   Oral   Take 1 tablet by mouth every 8 (eight) hours as needed for pain.         Marland Kitchen lisinopril-hydrochlorothiazide (PRINZIDE,ZESTORETIC) 20-25 MG per tablet   Oral   Take 0.5 tablets by mouth daily.          Marland Kitchen LORazepam (ATIVAN) 1 MG tablet   Oral   Take 1 mg by mouth at bedtime as needed (insomnia).          . simvastatin (ZOCOR) 20 MG tablet   Oral   Take 20 mg by mouth every evening.          BP 114/67  Pulse 97  Temp(Src) 97.5 F (36.4 C) (Oral)  Wt 142 lb 12.8 oz (64.774 kg)  SpO2 100% Physical Exam  Nursing note and vitals reviewed. Constitutional: He is oriented to person, place, and time. He appears well-developed and well-nourished. No distress.  HENT:  Head: Normocephalic and atraumatic.  Mouth/Throat: No oropharyngeal exudate.  Eyes: EOM are normal. Pupils are equal, round, and reactive to light.  Neck: Normal range of motion. Neck supple.  Cardiovascular: Normal rate and regular rhythm.  Exam reveals no friction rub.   No murmur heard. Pulmonary/Chest: Effort normal and breath sounds normal. No respiratory distress. He has no wheezes. He has no rales.  Abdominal: He exhibits no distension. There is no tenderness. There is no rebound.  Musculoskeletal: Normal range of motion. He exhibits no edema.  Neurological: He is alert and oriented to person, place, and time. No cranial nerve deficit. He exhibits normal muscle tone. Coordination normal. GCS eye subscore is 4. GCS verbal subscore is 5. GCS motor subscore is 6.  Skin: Skin is warm. No rash noted. He is not diaphoretic.    ED Course  Procedures (including critical care time) Labs Review Labs Reviewed - No data to display Imaging Review Ct Head Wo Contrast  04/25/2013   CLINICAL DATA:  Occipital headache, cutaneous occipital region "knot"  EXAM: CT HEAD WITHOUT CONTRAST  TECHNIQUE: Contiguous axial images were  obtained from the base of the skull through the vertex without intravenous contrast.  COMPARISON:  CT scan of the brain dated December 09, 2012.  FINDINGS: There is stable mild diffuse cerebral and cerebellar atrophy with very mild compensatory ventriculomegaly. There is decreased density in the deep white matter both cerebral hemispheres consistent with chronic small vessel ischemic type change. Old lacunar infarctions in the basal ganglia bilaterally are demonstrated. There is no evidence of an acute intracranial hemorrhage nor of an evolving ischemic infarction.  At bone window settings the observed portions of the paranasal sinuses and mastoid air cells are clear. The globes and orbital structures appear normal. There is no evidence of an acute or old skull fracture. No definite soft tissue abnormality of the calvarium is demonstrated in the occipital region.  IMPRESSION: 1. There is no evidence of an acute intracranial hemorrhage nor of an evolving ischemic infarction. There is evidence of previous lacunar infarctions in the basal ganglia and there are changes consistent with chronic small vessel ischemia. 2. There is no intracranial mass effect or hydrocephalus. 3. There is no evidence of inflammatory change of the visualized portions of the paranasal sinuses. 4. No bony or soft tissue abnormality in the occipital region is demonstrated.   Electronically Signed   By: David  [place]   On: 04/25/2013 10:06    EKG Interpretation   None       MDM   1. Headache    79 year old male with history of prior brain tumor presents with headaches. He has history of chronic headaches and states of being on since 1998. He saw a neurologist 6 months ago recommended Depakote, his wife states that didn't help. He states the headache isn't worsening but cannot tell me suffer how long worsens when. He is extremely poor story and, as is his wife. Patient denies any fever, vision loss, seizures, weakness, numbness. Hears  neuro exam is normal. I will CT his head and give Percocet to see if this relieves his headache. CT normal. Patient states no major relief with meds. Patient has chronic HAs and states this has been going on since 1998. I do not feel we will fully alleviate his pain today. Stable for discharge, given neurology f/u.  Dagmar Hait, MD 04/25/13 [phone]

## 2013-07-12 ENCOUNTER — Emergency Department (HOSPITAL_COMMUNITY)
Admission: EM | Admit: 2013-07-12 | Discharge: 2013-07-12 | Disposition: A | Discharge status: Home / Self Care | Payer: Medicare Other | Attending: Emergency Medicine | Admitting: Emergency Medicine

## 2013-07-12 ENCOUNTER — Encounter (HOSPITAL_COMMUNITY): Payer: Self-pay | Admitting: Emergency Medicine

## 2013-07-12 DIAGNOSIS — R599 Enlarged lymph nodes, unspecified: Secondary | ICD-10-CM | POA: Insufficient documentation

## 2013-07-12 DIAGNOSIS — Z87891 Personal history of nicotine dependence: Secondary | ICD-10-CM | POA: Insufficient documentation

## 2013-07-12 DIAGNOSIS — Z9889 Other specified postprocedural states: Secondary | ICD-10-CM | POA: Insufficient documentation

## 2013-07-12 DIAGNOSIS — I10 Essential (primary) hypertension: Secondary | ICD-10-CM | POA: Insufficient documentation

## 2013-07-12 DIAGNOSIS — Z79899 Other long term (current) drug therapy: Secondary | ICD-10-CM | POA: Insufficient documentation

## 2013-07-12 DIAGNOSIS — E7800 Pure hypercholesterolemia, unspecified: Secondary | ICD-10-CM | POA: Insufficient documentation

## 2013-07-12 DIAGNOSIS — R590 Localized enlarged lymph nodes: Secondary | ICD-10-CM

## 2013-07-12 DIAGNOSIS — Z86011 Personal history of benign neoplasm of the brain: Secondary | ICD-10-CM | POA: Insufficient documentation

## 2013-07-12 MED ORDER — CEPHALEXIN 500 MG PO CAPS: 500.0000 mg | ORAL_CAPSULE | Freq: Three times a day (TID) | ORAL | Status: DC

## 2013-07-12 NOTE — ED Notes (Signed)
Pt reports pain to right side posterior head x 2-3 years but increase in pain. No acute distress noted.

## 2013-07-12 NOTE — Discharge Instructions (Signed)
Keflex as prescribed.  If you're not improving in the next week, followup with your primary Dr. Return to the emergency department if your symptoms substantially worsen or change.   Lymphadenopathy Lymphadenopathy means "disease of the lymph glands." But the term is usually used to describe swollen or enlarged lymph glands, also called lymph nodes. These are the bean-shaped organs found in many locations including the neck, underarm, and groin. Lymph glands are part of the immune system, which fights infections in your body. Lymphadenopathy can occur in just one area of the body, such as the neck, or it can be generalized, with lymph node enlargement in several areas. The nodes found in the neck are the most common sites of lymphadenopathy. CAUSES  When your immune system responds to germs (such as viruses or bacteria ), infection-fighting cells and fluid build up. This causes the glands to grow in size. This is usually not something to worry about. Sometimes, the glands themselves can become infected and inflamed. This is called lymphadenitis. Enlarged lymph nodes can be caused by many diseases:  Bacterial disease, such as strep throat or a skin infection.  Viral disease, such as a common cold.  Other germs, such as lyme disease, tuberculosis, or sexually transmitted diseases.  Cancers, such as lymphoma (cancer of the lymphatic system) or leukemia (cancer of the white blood cells).  Inflammatory diseases such as lupus or rheumatoid arthritis.  Reactions to medications. Many of the diseases above are rare, but important. This is why you should see your caregiver if you have lymphadenopathy. SYMPTOMS   Swollen, enlarged lumps in the neck, back of the head or other locations.  Tenderness.  Warmth or redness of the skin over the lymph nodes.  Fever. DIAGNOSIS  Enlarged lymph nodes are often near the source of infection. They can help healthcare providers diagnose your illness. For  instance:   Swollen lymph nodes around the jaw might be caused by an infection in the mouth.  Enlarged glands in the neck often signal a throat infection.  Lymph nodes that are swollen in more than one area often indicate an illness caused by a virus. Your caregiver most likely will know what is causing your lymphadenopathy after listening to your history and examining you. Blood tests, x-rays or other tests may be needed. If the cause of the enlarged lymph node cannot be found, and it does not go away by itself, then a biopsy may be needed. Your caregiver will discuss this with you. TREATMENT  Treatment for your enlarged lymph nodes will depend on the cause. Many times the nodes will shrink to normal size by themselves, with no treatment. Antibiotics or other medicines may be needed for infection. Only take over-the-counter or prescription medicines for pain, discomfort or fever as directed by your caregiver. HOME CARE INSTRUCTIONS  Swollen lymph glands usually return to normal when the underlying medical condition goes away. If they persist, contact your health-care provider. He/she might prescribe antibiotics or other treatments, depending on the diagnosis. Take any medications exactly as prescribed. Keep any follow-up appointments made to check on the condition of your enlarged nodes.  SEEK MEDICAL CARE IF:   Swelling lasts for more than two weeks.  You have symptoms such as weight loss, night sweats, fatigue or fever that does not go away.  The lymph nodes are hard, seem fixed to the skin or are growing rapidly.  Skin over the lymph nodes is red and inflamed. This could mean there is an infection. SEEK  IMMEDIATE MEDICAL CARE IF:   Fluid starts leaking from the area of the enlarged lymph node.  You develop a fever of 102 F (38.9 C) or greater.  Severe pain develops (not necessarily at the site of a large lymph node).  You develop chest pain or shortness of breath.  You develop  worsening abdominal pain. MAKE SURE YOU:   Understand these instructions.  Will watch your condition.  Will get help right away if you are not doing well or get worse. Document Released: 01/27/2008 Document Revised: 07/12/2011 Document Reviewed: 01/27/2008 [facility] Patient Information 2014 [place].

## 2013-07-12 NOTE — ED Provider Notes (Signed)
CSN: [id number]     Arrival date & time 07/12/13  0825 History   First MD Initiated Contact with Patient 07/12/13 [phone]     Chief Complaint  Patient presents with  . Headache     (Consider location/radiation/quality/duration/timing/severity/associated sxs/prior Treatment) HPI Comments: Patient presents with complaints of pain to the back side of his head that has been ongoing for 2 years, but has been worse over the past 2 weeks. He denies any injury or trauma. He denies any fevers.  Patient is a 79 y.o. male presenting with headaches. The history is provided by the patient.  Headache Pain location:  Occipital Quality:  Dull Radiates to:  Does not radiate Duration:  2 weeks Timing:  Constant Progression:  Unchanged Chronicity:  New   Past Medical History  Diagnosis Date  . Hypertension   . Brain tumor   . Hypercholesteremia   . DJMEQAST(419.6)    Past Surgical History  Procedure Laterality Date  . Shoulder surgery      right  . Brain surgery      x3   Family History  Problem Relation Age of Onset  . Appendicitis Father    History  Substance Use Topics  . Smoking status: Former Smoker    Quit date: 09/19/1976  . Smokeless tobacco: Never Used  . Alcohol Use: No    Review of Systems  Neurological: Positive for headaches.  All other systems reviewed and are negative.      Allergies  Review of patient's allergies indicates no known allergies.  Home Medications   Current Outpatient Rx  Name  Route  Sig  Dispense  Refill  . HYDROcodone-acetaminophen (NORCO/VICODIN) 5-325 MG per tablet   Oral   Take 1 tablet by mouth every 8 (eight) hours as needed for moderate pain.   20 tablet   0   . lisinopril-hydrochlorothiazide (PRINZIDE,ZESTORETIC) 20-25 MG per tablet   Oral   Take 0.5 tablets by mouth daily.          Marland Kitchen LORazepam (ATIVAN) 1 MG tablet   Oral   Take 1 mg by mouth at bedtime as needed (insomnia).          . simvastatin (ZOCOR) 20 MG  tablet   Oral   Take 20 mg by mouth every evening.         . cephALEXin (KEFLEX) 500 MG capsule   Oral   Take 1 capsule (500 mg total) by mouth 3 (three) times daily.   30 capsule   0    BP 135/71  Pulse 81  Temp(Src) 97.5 F (36.4 C) (Oral)  Resp 16  SpO2 98% Physical Exam  Nursing note and vitals reviewed. Constitutional: He is oriented to person, place, and time. He appears well-developed and well-nourished. No distress.  HENT:  Head: Normocephalic.  There is a firm soft tissue lesion noted in the right occiput. There is no fluctuance and no overlying erythema. This is tender to palpation.  Eyes: EOM are normal. Pupils are equal, round, and reactive to light.  Neck: Normal range of motion. Neck supple.  Neurological: He is alert and oriented to person, place, and time. No cranial nerve deficit.  Skin: Skin is warm and dry. He is not diaphoretic.    ED Course  Procedures (including critical care time) Labs Review Labs Reviewed - No data to display Imaging Review No results found.   EKG Interpretation None      MDM   Final diagnoses:  Occipital lymphadenopathy  This appears to be occipital lymphadenopathy. I doubt an abscess. I will treat with a short course of Keflex and followup with his primary Dr. he understands to return if his symptoms worsen or change. He had a head CT in December and I doubt any acute intracranial pathology.    Veryl Speak, MD 07/12/13 [phone]

## 2013-08-14 ENCOUNTER — Encounter: Payer: Self-pay | Admitting: Neurology

## 2013-08-14 ENCOUNTER — Ambulatory Visit (INDEPENDENT_AMBULATORY_CARE_PROVIDER_SITE_OTHER): Payer: Medicare Other | Admitting: Neurology

## 2013-08-14 VITALS — BP 104/60 | HR 66 | Temp 98.0°F | Resp 16 | Ht 66.0 in | Wt 144.6 lb

## 2013-08-14 DIAGNOSIS — R51 Headache: Secondary | ICD-10-CM

## 2013-08-14 DIAGNOSIS — R519 Headache, unspecified: Secondary | ICD-10-CM

## 2013-08-14 DIAGNOSIS — G4440 Drug-induced headache, not elsewhere classified, not intractable: Secondary | ICD-10-CM

## 2013-08-14 NOTE — Progress Notes (Signed)
NEUROLOGY CONSULTATION NOTE  Richard Mack MRN: [id number] DOB: 11-04-1928  Referring provider: Dr. Moreen Fowler Primary care provider: Dr. Moreen Fowler  Reason for consult:  Chronic headache.  HISTORY OF PRESENT ILLNESS: Richard Mack is an 80 year old right-handed man with hypercholesterolemia, hypertension, insomnia, anxiety, dementia and osteoporosis who presents for headache.  He is accompanied by his wife.  Records and images were personally reviewed where available.    Onset:  Symptoms started following excision of subcutaneous cysts of the scalp in 1998. Location:  Top of head, bi-frontal, sometimes back of head.  No neck pain. Quality:  Cannot elaborate Intensity:  Very severe, he often cries Aura:  no Associated symptoms:  No nausea, vomiting, photophobia, phonophobia, osmophobia, or visual disturbance. Duration:  Constant Frequency:  Constant Triggers/exacerbating factors:  None Relieving factors:  None  Past abortive therapy:  local nerve blocks, hydrocodone, tramadol Past preventative therapy:  amitriptyline 12.5mg -25mg , Lyrica, gabapentin, Depakote 500mg  at bedtime (side effects)  Current abortive therapy:  Hydrocodone-APA 5-325mg  takes 2-3 daily for many years Current preventative therapy:  none  Depression/Stress:  Stress due to head pain Sleep:   Cannot sleep due to pain.  Sleeps only 1 to 2 hours a night No prior history of headache  10/03/12 LABS:  RPR NR, B12 484, folate 7.4, ANA negative, CRP 0.5, TSH 0.883  Has [facility] at [facility] and by Dr. Jacelyn Grip, who previously worked at [employer].  MRI of brain in 2008 was unremarkable.Marland Kitchen  PAST MEDICAL HISTORY: Past Medical History  Diagnosis Date  . Hypertension   . Brain tumor   . Hypercholesteremia   . Headache(784.0)     PAST SURGICAL HISTORY: Past Surgical History  Procedure Laterality Date  . Shoulder surgery      right  . Brain surgery      x3    MEDICATIONS: Current Outpatient Prescriptions on File Prior to Visit   Medication Sig Dispense Refill  . HYDROcodone-acetaminophen (NORCO/VICODIN) 5-325 MG per tablet Take 1 tablet by mouth every 8 (eight) hours as needed for moderate pain.  20 tablet  0  . lisinopril-hydrochlorothiazide (PRINZIDE,ZESTORETIC) 20-25 MG per tablet Take 0.5 tablets by mouth daily.       Marland Kitchen LORazepam (ATIVAN) 1 MG tablet Take 1 mg by mouth at bedtime as needed (insomnia).       . simvastatin (ZOCOR) 20 MG tablet Take 20 mg by mouth every evening.      . cephALEXin (KEFLEX) 500 MG capsule Take 1 capsule (500 mg total) by mouth 3 (three) times daily.  30 capsule  0   No current facility-administered medications on file prior to visit.    ALLERGIES: Allergies  Allergen Reactions  . Cephalexin Nausea And Vomiting    FAMILY HISTORY: Family History  Problem Relation Age of Onset  . Appendicitis Father     SOCIAL HISTORY: History   Social History  . Marital Status: Married    Spouse Name: Apolonio Schneiders    Number of Children: 2  . Years of Education: 2nd   Occupational History  . Retired    Social History Main Topics  . Smoking status: Former Smoker    Quit date: 09/19/1976  . Smokeless tobacco: Never Used  . Alcohol Use: No  . Drug Use: No  . Sexual Activity: Not on file   Other Topics Concern  . Not on file   Social History Narrative   Patient lives at home with his wife Apolonio Schneiders). Patient has two children. Patient is retired. One cup  coffee sometimes may every other day. Right handed.    REVIEW OF SYSTEMS: Constitutional: No fevers, chills, or sweats, no generalized fatigue, change in appetite Eyes: No visual changes, double vision, eye pain Ear, nose and throat: No hearing loss, ear pain, nasal congestion, sore throat Cardiovascular: No chest pain, palpitations Respiratory:  No shortness of breath at rest or with exertion, wheezes GastrointestinaI: No nausea, vomiting, diarrhea, abdominal pain, fecal incontinence Genitourinary:  No dysuria, urinary retention or  frequency Musculoskeletal:  Right shoulder pain and reduced range of motion Integumentary: No rash, pruritus, skin lesions Neurological: as above Psychiatric: No depression, insomnia, anxiety Endocrine: No palpitations, fatigue, diaphoresis, mood swings, change in appetite, change in weight, increased thirst Hematologic/Lymphatic:  No anemia, purpura, petechiae. Allergic/Immunologic: no itchy/runny eyes, nasal congestion, recent allergic reactions, rashes  PHYSICAL EXAM: Filed Vitals:   08/14/13 0745  BP: 104/60  Pulse: 66  Temp: 98 F (36.7 C)  Resp: 16   General: No acute distress Head:  Normocephalic/atraumatic Neck: supple, no paraspinal tenderness, full range of motion Back: No paraspinal tenderness Heart: regular rate and rhythm Lungs: Clear to auscultation bilaterally. Vascular: No carotid bruits. Neurological Exam: Mental status: alert and oriented to person, place only (not time), remote memory intact, fund of knowledge intact, attention and concentration intact, speech fluent and not dysarthric, language intact. Cranial nerves: CN I: not tested CN II: pupils equal, round and reactive to light, visual fields intact, fundi not visualized. CN III, IV, VI:  full range of motion, no nystagmus, no ptosis CN V: facial sensation intact CN VII: upper and lower face symmetric CN VIII: hearing intact CN IX, X: gag intact, uvula midline CN XI: sternocleidomastoid and trapezius muscles intact CN XII: tongue midline Bulk & Tone: normal, no fasciculations. Motor: 3/5 right proximal upper extremity, limited due to pain and prior surgery, otherwise 5/5 Sensation: Temperature and vibration intact Deep Tendon Reflexes: 2+ in the upper extremities and symmetric, trace in the patellas, absent in the ankles, toes downgoing Finger to nose testing: No dysmetria on the left, unable to assess the right Gait: Flexed posture, short strides, takes 3-4 steps to turn. Cannot tandem walk. Romberg  negative.  IMPRESSION: Chronic medication-overuse headache.  He is suffered daily headaches since 1998 and has been on chronic narcotic use since that time. He is seen at least 3 general neurologists with no improvement. At this point, I feel he would best be evaluated and treated by a specific headache specialist.  PLAN: Referral to the [facility]. Followup as needed.  45 minutes spent with the patient, over 50% spent counseling and coordinating care.  Thank you for allowing me to take part in the care of this patient.  Metta Clines, DO  CC: Antony Contras, MD

## 2013-08-14 NOTE — Addendum Note (Signed)
Addended by: Charyl Bigger E on: 08/14/2013 10:37 AM   Modules accepted: Orders

## 2013-08-14 NOTE — Patient Instructions (Signed)
Since you have such a complex headache, you would best be treated by a headache specialist.  We will refer you.

## 2013-08-22 ENCOUNTER — Telehealth: Payer: Self-pay | Admitting: *Deleted

## 2013-08-22 NOTE — Telephone Encounter (Signed)
Pam from headache clinic called stating patient is declining his appt that was made for him

## 2013-11-12 ENCOUNTER — Encounter (HOSPITAL_COMMUNITY): Payer: Self-pay | Admitting: Emergency Medicine

## 2013-11-12 ENCOUNTER — Emergency Department (HOSPITAL_COMMUNITY)
Admission: EM | Admit: 2013-11-12 | Discharge: 2013-11-12 | Disposition: A | Discharge status: Home / Self Care | Payer: Medicare Other | Attending: Emergency Medicine | Admitting: Emergency Medicine

## 2013-11-12 DIAGNOSIS — I10 Essential (primary) hypertension: Secondary | ICD-10-CM | POA: Insufficient documentation

## 2013-11-12 DIAGNOSIS — Z86011 Personal history of benign neoplasm of the brain: Secondary | ICD-10-CM | POA: Insufficient documentation

## 2013-11-12 DIAGNOSIS — Z792 Long term (current) use of antibiotics: Secondary | ICD-10-CM | POA: Insufficient documentation

## 2013-11-12 DIAGNOSIS — E7800 Pure hypercholesterolemia, unspecified: Secondary | ICD-10-CM | POA: Insufficient documentation

## 2013-11-12 DIAGNOSIS — Z79899 Other long term (current) drug therapy: Secondary | ICD-10-CM | POA: Insufficient documentation

## 2013-11-12 DIAGNOSIS — G8929 Other chronic pain: Secondary | ICD-10-CM

## 2013-11-12 DIAGNOSIS — R51 Headache: Secondary | ICD-10-CM | POA: Insufficient documentation

## 2013-11-12 DIAGNOSIS — Z87891 Personal history of nicotine dependence: Secondary | ICD-10-CM | POA: Insufficient documentation

## 2013-11-12 MED ORDER — BUTALBITAL-APAP-CAFFEINE 50-325-40 MG PO TABS: 1.0000 | ORAL_TABLET | Freq: Four times a day (QID) | ORAL | Status: DC | PRN

## 2013-11-12 MED ORDER — HYDROMORPHONE HCL PF 1 MG/ML IJ SOLN
1.0000 mg | Freq: Once | INTRAMUSCULAR | Status: AC
  Administered 2013-11-12: 1 mg via INTRAMUSCULAR
  Filled 2013-11-12: qty 1

## 2013-11-12 NOTE — ED Notes (Signed)
Pharmacy tech at bedside 

## 2013-11-12 NOTE — ED Notes (Signed)
Pt reports headache ongoing since 1980. Pt reports headache constantly. Pt denies n/v. Speech clear, tongue midline. Grips equal B/L.

## 2013-11-12 NOTE — ED Notes (Signed)
Pt requesting prescription for HA. Gertie Fey, PA made aware.

## 2013-11-12 NOTE — ED Provider Notes (Signed)
CSN: [id number]     Arrival date & time 11/12/13  8250 History   First MD Initiated Contact with Patient 11/12/13 0756     Chief Complaint  Patient presents with  . Headache     (Consider location/radiation/quality/duration/timing/severity/associated sxs/prior Treatment) HPI  80 year old male with history of chronic headache for the past 35 years presenting for evaluation of headache. Patient reports chronic daily headache which he described as a sharp throbbing sensation prior to his forehead radiates to the back of his head which started when he wakes, worsening with movement and progressively worsened throughout the day. Headache is unrelieved with taking hydrocodone prescribed by his Dr. He also complaining of a knot to be right parietal region which he worries about tumors. He has had this for more than 10 years and has had multiple imaging since that shows no evidence of malignancy. Headache is constant and affecting his daily living. No complaint of fever, chills, runny nose, sneezing, coughing, chest pain, shortness of breath, abdominal pain, nausea vomiting diarrhea, or rash. Denies any numbness or weakness. Patient states he has been seen by several specialists but it has not helped.  He usually takes 2 hydrocodone and a day and sometimes 3.  Past Medical History  Diagnosis Date  . Hypertension   . Brain tumor   . Hypercholesteremia   . NLZJQBHA(193.7)    Past Surgical History  Procedure Laterality Date  . Shoulder surgery      right  . Brain surgery      x3   Family History  Problem Relation Age of Onset  . Appendicitis Father    History  Substance Use Topics  . Smoking status: Former Smoker    Quit date: 09/19/1976  . Smokeless tobacco: Never Used  . Alcohol Use: No    Review of Systems  All other systems reviewed and are negative.     Allergies  Cephalexin  Home Medications   Prior to Admission medications   Medication Sig Start Date End Date Taking?  Authorizing Provider  cephALEXin (KEFLEX) 500 MG capsule Take 1 capsule (500 mg total) by mouth 3 (three) times daily. 07/12/13   Veryl Speak, MD  HYDROcodone-acetaminophen (NORCO/VICODIN) 5-325 MG per tablet Take 1 tablet by mouth every 8 (eight) hours as needed for moderate pain. 04/25/13   Osvaldo Shipper, MD  lisinopril-hydrochlorothiazide (PRINZIDE,ZESTORETIC) 20-25 MG per tablet Take 0.5 tablets by mouth daily.     Historical Provider, MD  LORazepam (ATIVAN) 1 MG tablet Take 1 mg by mouth at bedtime as needed (insomnia).     Historical Provider, MD  simvastatin (ZOCOR) 20 MG tablet Take 20 mg by mouth every evening.    Historical Provider, MD   BP 114/61  Pulse 78  Temp(Src) 97.8 F (36.6 C) (Oral)  Resp 18  Ht 5\' 6"  (1.676 m)  Wt 140 lb (63.504 kg)  BMI 22.61 kg/m2  SpO2 100% Physical Exam  Constitutional: He appears well-developed and well-nourished. No distress.  HENT:  Head: Atraumatic.  No mass or abnormality on scalp inspection.    Eyes: Conjunctivae and EOM are normal. Pupils are equal, round, and reactive to light.  Arcus senilis to bilateral eye  Neck: Normal range of motion. Neck supple.  No nuchal ridigity  Cardiovascular: Normal rate and regular rhythm.   Pulmonary/Chest: Effort normal and breath sounds normal.  Neurological: He is alert.  Neurologic exam:  Speech clear, pupils equal round reactive to light, extraocular movements intact  Normal peripheral visual  fields Cranial nerves III through XII normal including no facial droop Follows commands, moves all extremities x4, normal strength to bilateral upper and lower extremities at all major muscle groups including grip Sensation normal to light touch  Coordination intact, no limb ataxia, finger-nose-finger normal Rapid alternating movements normal No pronator drift Gait normal   Skin: No rash noted.  Psychiatric: He has a normal mood and affect.    ED Course  Procedures (including critical care  time)  8:30 AM Patient presents with chronic headache. This headache is no different from his usual headache. He has had this headache for more than 3 decades. He has been evaluated multiple times for this headache. His neurologist has suggested that his headache is likely due to medication overuse. He has a MRI of his brain in 2008. He has had numerous head CT scan for evaluation of headache, last one being in December 2014 that shows no malignancy or any other acute finding. At this time no specific new finding concerning for any acute emergent condition. I recommend patient to follow up with urologist for further management. We'll provide short course of pain medication to treat his symptoms here. Care discussed with Dr Ashok Cordia.   Labs Review Labs Reviewed - No data to display  Imaging Review No results found.   EKG Interpretation None      MDM   Final diagnoses:  Chronic intractable headache, unspecified headache type    BP 129/74  Pulse 65  Temp(Src) 97.8 F (36.6 C) (Oral)  Resp 17  Ht 5\' 6"  (1.676 m)  Wt 140 lb (63.504 kg)  BMI 22.61 kg/m2  SpO2 100%     Domenic Moras, PA-C 11/12/13 [id number]

## 2013-11-12 NOTE — Discharge Instructions (Signed)
Please follow up closely with your neurologist for further management of your chronic headache  Headaches, Frequently Asked Questions MIGRAINE HEADACHES Q: What is migraine? What causes it? How can I treat it? A: Generally, migraine headaches begin as a dull ache. Then they develop into a constant, throbbing, and pulsating pain. You may experience pain at the temples. You may experience pain at the front or back of one or both sides of the head. The pain is usually accompanied by a combination of:  Nausea.  Vomiting.  Sensitivity to light and noise. Some people (about 15%) experience an aura (see below) before an attack. The cause of migraine is believed to be chemical reactions in the brain. Treatment for migraine may include over-the-counter or prescription medications. It may also include self-help techniques. These include relaxation training and biofeedback.  Q: What is an aura? A: About 15% of people with migraine get an "aura". This is a sign of neurological symptoms that occur before a migraine headache. You may see wavy or jagged lines, dots, or flashing lights. You might experience tunnel vision or blind spots in one or both eyes. The aura can include visual or auditory hallucinations (something imagined). It may include disruptions in smell (such as strange odors), taste or touch. Other symptoms include:  Numbness.  A "pins and needles" sensation.  Difficulty in recalling or speaking the correct word. These neurological events may last as long as 60 minutes. These symptoms will fade as the headache begins. Q: What is a trigger? A: Certain physical or environmental factors can lead to or "trigger" a migraine. These include:  Foods.  Hormonal changes.  Weather.  Stress. It is important to remember that triggers are different for everyone. To help prevent migraine attacks, you need to figure out which triggers affect you. Keep a headache diary. This is a good way to track  triggers. The diary will help you talk to your healthcare professional about your condition. Q: Does weather affect migraines? A: Bright sunshine, hot, humid conditions, and drastic changes in barometric pressure may lead to, or "trigger," a migraine attack in some people. But studies have shown that weather does not act as a trigger for everyone with migraines. Q: What is the link between migraine and hormones? A: Hormones start and regulate many of your body's functions. Hormones keep your body in balance within a constantly changing environment. The levels of hormones in your body are unbalanced at times. Examples are during menstruation, pregnancy, or menopause. That can lead to a migraine attack. In fact, about three quarters of all women with migraine report that their attacks are related to the menstrual cycle.  Q: Is there an increased risk of stroke for migraine sufferers? A: The likelihood of a migraine attack causing a stroke is very remote. That is not to say that migraine sufferers cannot have a stroke associated with their migraines. In persons under age 69, the most common associated factor for stroke is migraine headache. But over the course of a person's normal life span, the occurrence of migraine headache may actually be associated with a reduced risk of dying from cerebrovascular disease due to stroke.  Q: What are acute medications for migraine? A: Acute medications are used to treat the pain of the headache after it has started. Examples over-the-counter medications, NSAIDs, ergots, and triptans.  Q: What are the triptans? A: Triptans are the newest class of abortive medications. They are specifically targeted to treat migraine. Triptans are vasoconstrictors. They moderate some  chemical reactions in the brain. The triptans work on receptors in your brain. Triptans help to restore the balance of a neurotransmitter called serotonin. Fluctuations in levels of serotonin are thought to be  a main cause of migraine.  Q: Are over-the-counter medications for migraine effective? A: Over-the-counter, or "OTC," medications may be effective in relieving mild to moderate pain and associated symptoms of migraine. But you should see your caregiver before beginning any treatment regimen for migraine.  Q: What are preventive medications for migraine? A: Preventive medications for migraine are sometimes referred to as "prophylactic" treatments. They are used to reduce the frequency, severity, and length of migraine attacks. Examples of preventive medications include antiepileptic medications, antidepressants, beta-blockers, calcium channel blockers, and NSAIDs (nonsteroidal anti-inflammatory drugs). Q: Why are anticonvulsants used to treat migraine? A: During the past few years, there has been an increased interest in antiepileptic drugs for the prevention of migraine. They are sometimes referred to as "anticonvulsants". Both epilepsy and migraine may be caused by similar reactions in the brain.  Q: Why are antidepressants used to treat migraine? A: Antidepressants are typically used to treat people with depression. They may reduce migraine frequency by regulating chemical levels, such as serotonin, in the brain.  Q: What alternative therapies are used to treat migraine? A: The term "alternative therapies" is often used to describe treatments considered outside the scope of conventional Western medicine. Examples of alternative therapy include acupuncture, acupressure, and yoga. Another common alternative treatment is herbal therapy. Some herbs are believed to relieve headache pain. Always discuss alternative therapies with your caregiver before proceeding. Some herbal products contain arsenic and other toxins. TENSION HEADACHES Q: What is a tension-type headache? What causes it? How can I treat it? A: Tension-type headaches occur randomly. They are often the result of temporary stress, anxiety,  fatigue, or anger. Symptoms include soreness in your temples, a tightening band-like sensation around your head (a "vice-like" ache). Symptoms can also include a pulling feeling, pressure sensations, and contracting head and neck muscles. The headache begins in your forehead, temples, or the back of your head and neck. Treatment for tension-type headache may include over-the-counter or prescription medications. Treatment may also include self-help techniques such as relaxation training and biofeedback. CLUSTER HEADACHES Q: What is a cluster headache? What causes it? How can I treat it? A: Cluster headache gets its name because the attacks come in groups. The pain arrives with little, if any, warning. It is usually on one side of the head. A tearing or bloodshot eye and a runny nose on the same side of the headache may also accompany the pain. Cluster headaches are believed to be caused by chemical reactions in the brain. They have been described as the most severe and intense of any headache type. Treatment for cluster headache includes prescription medication and oxygen. SINUS HEADACHES Q: What is a sinus headache? What causes it? How can I treat it? A: When a cavity in the bones of the face and skull (a sinus) becomes inflamed, the inflammation will cause localized pain. This condition is usually the result of an allergic reaction, a tumor, or an infection. If your headache is caused by a sinus blockage, such as an infection, you will probably have a fever. An x-ray will confirm a sinus blockage. Your caregiver's treatment might include antibiotics for the infection, as well as antihistamines or decongestants.  REBOUND HEADACHES Q: What is a rebound headache? What causes it? How can I treat it? A: A pattern of  taking acute headache medications too often can lead to a condition known as "rebound headache." A pattern of taking too much headache medication includes taking it more than 2 days per week or in  excessive amounts. That means more than the label or a caregiver advises. With rebound headaches, your medications not only stop relieving pain, they actually begin to cause headaches. Doctors treat rebound headache by tapering the medication that is being overused. Sometimes your caregiver will gradually substitute a different type of treatment or medication. Stopping may be a challenge. Regularly overusing a medication increases the potential for serious side effects. Consult a caregiver if you regularly use headache medications more than 2 days per week or more than the label advises. ADDITIONAL QUESTIONS AND ANSWERS Q: What is biofeedback? A: Biofeedback is a self-help treatment. Biofeedback uses special equipment to monitor your body's involuntary physical responses. Biofeedback monitors:  Breathing.  Pulse.  Heart rate.  Temperature.  Muscle tension.  Brain activity. Biofeedback helps you refine and perfect your relaxation exercises. You learn to control the physical responses that are related to stress. Once the technique has been mastered, you do not need the equipment any more. Q: Are headaches hereditary? A: Four out of five (80%) of people that suffer report a family history of migraine. Scientists are not sure if this is genetic or a family predisposition. Despite the uncertainty, a child has a 50% chance of having migraine if one parent suffers. The child has a 75% chance if both parents suffer.  Q: Can children get headaches? A: By the time they reach high school, most young people have experienced some type of headache. Many safe and effective approaches or medications can prevent a headache from occurring or stop it after it has begun.  Q: What type of doctor should I see to diagnose and treat my headache? A: Start with your primary caregiver. Discuss his or her experience and approach to headaches. Discuss methods of classification, diagnosis, and treatment. Your caregiver may  decide to recommend you to a headache specialist, depending upon your symptoms or other physical conditions. Having diabetes, allergies, etc., may require a more comprehensive and inclusive approach to your headache. The National Headache Foundation will provide, upon request, a list of [facility] physician members in your state. Document Released: 07/10/2003 Document Revised: 07/12/2011 Document Reviewed: 12/18/2007 [facility] Patient Information 2015 [place], [place]. This information is not intended to replace advice given to you by your health care provider. Make sure you discuss any questions you have with your health care provider.

## 2013-11-12 NOTE — ED Notes (Signed)
Pt reports he had a cyst removed in 1970 and ever since then has gotten chronic HAs. Pt c/o knot to the posterior right head x 7 years, no swelling or raised area seen to where pt is pointing. Pt sts he hasn't been able to sleep lately because the pain is so bad. Pt sts he takes Vicodin for this HA, sts he doesn't get much relief from them anymore. Pt denies difficultly walking/talking, denies blurred vision/dizziness. Nad, skin warm and dry, resp e/u.

## 2013-11-14 NOTE — ED Provider Notes (Signed)
Medical screening examination/treatment/procedure(s) were conducted as a shared visit with non-physician practitioner(s) and myself.  I personally evaluated the patient during the encounter.   Pt c/o chronic dull frontal headaches, c/w prior headaches. No fever or chills. No neck pain or stiffness. No eye pain or change in vision. No numbness/weakness. No recent trauma or fall. Notes long hx same headaches. Has been referred to headache clinic in past.    Pt alert, content appearing. No sinus or temporal tenderness. No neck stiffness or rigidity. Afeb. Motor/sens intact.   Mirna Mires, MD 11/14/13 [phone]

## 2014-02-09 ENCOUNTER — Encounter (HOSPITAL_COMMUNITY): Payer: Self-pay | Admitting: Emergency Medicine

## 2014-02-09 ENCOUNTER — Emergency Department (HOSPITAL_COMMUNITY): Payer: Medicare Other

## 2014-02-09 ENCOUNTER — Emergency Department (HOSPITAL_COMMUNITY)
Admission: EM | Admit: 2014-02-09 | Discharge: 2014-02-09 | Disposition: A | Discharge status: Home / Self Care | Payer: Medicare Other | Attending: Emergency Medicine | Admitting: Emergency Medicine

## 2014-02-09 DIAGNOSIS — Z86011 Personal history of benign neoplasm of the brain: Secondary | ICD-10-CM | POA: Insufficient documentation

## 2014-02-09 DIAGNOSIS — G8929 Other chronic pain: Secondary | ICD-10-CM | POA: Insufficient documentation

## 2014-02-09 DIAGNOSIS — M2550 Pain in unspecified joint: Secondary | ICD-10-CM | POA: Diagnosis not present

## 2014-02-09 DIAGNOSIS — R51 Headache: Secondary | ICD-10-CM | POA: Diagnosis present

## 2014-02-09 DIAGNOSIS — Z8639 Personal history of other endocrine, nutritional and metabolic disease: Secondary | ICD-10-CM | POA: Diagnosis not present

## 2014-02-09 DIAGNOSIS — Z87891 Personal history of nicotine dependence: Secondary | ICD-10-CM | POA: Insufficient documentation

## 2014-02-09 DIAGNOSIS — Z79899 Other long term (current) drug therapy: Secondary | ICD-10-CM | POA: Diagnosis not present

## 2014-02-09 DIAGNOSIS — I10 Essential (primary) hypertension: Secondary | ICD-10-CM | POA: Insufficient documentation

## 2014-02-09 LAB — URINALYSIS, ROUTINE W REFLEX MICROSCOPIC
Bilirubin Urine: NEGATIVE
Glucose, UA: NEGATIVE mg/dL
HGB URINE DIPSTICK: NEGATIVE
Ketones, ur: NEGATIVE mg/dL
Leukocytes, UA: NEGATIVE
NITRITE: NEGATIVE
Protein, ur: NEGATIVE mg/dL
SPECIFIC GRAVITY, URINE: 1.028 (ref 1.005–1.030)
UROBILINOGEN UA: 1 mg/dL (ref 0.0–1.0)
pH: 5.5 (ref 5.0–8.0)

## 2014-02-09 LAB — BASIC METABOLIC PANEL
Anion gap: 11 (ref 5–15)
BUN: 13 mg/dL (ref 6–23)
CHLORIDE: 103 meq/L (ref 96–112)
CO2: 27 meq/L (ref 19–32)
Calcium: 11.8 mg/dL — ABNORMAL HIGH (ref 8.4–10.5)
Creatinine, Ser: 1.22 mg/dL (ref 0.50–1.35)
GFR calc non Af Amer: 53 mL/min — ABNORMAL LOW (ref >90)
GFR, EST AFRICAN AMERICAN: 61 mL/min — AB (ref >90)
Glucose, Bld: 99 mg/dL (ref 70–99)
POTASSIUM: 4.7 meq/L (ref 3.7–5.3)
Sodium: 141 mEq/L (ref 137–147)

## 2014-02-09 LAB — CBC WITH DIFFERENTIAL/PLATELET
BASOS PCT: 0 % (ref 0–1)
Basophils Absolute: 0 10*3/uL (ref 0.0–0.1)
Eosinophils Absolute: 0.1 10*3/uL (ref 0.0–0.7)
Eosinophils Relative: 1 % (ref 0–5)
HCT: 36 % — ABNORMAL LOW (ref 39.0–52.0)
HEMOGLOBIN: 12.5 g/dL — AB (ref 13.0–17.0)
LYMPHS ABS: 0.9 10*3/uL (ref 0.7–4.0)
LYMPHS PCT: 23 % (ref 12–46)
MCH: 32.6 pg (ref 26.0–34.0)
MCHC: 34.7 g/dL (ref 30.0–36.0)
MCV: 93.8 fL (ref 78.0–100.0)
MONOS PCT: 12 % (ref 3–12)
Monocytes Absolute: 0.5 10*3/uL (ref 0.1–1.0)
NEUTROS ABS: 2.5 10*3/uL (ref 1.7–7.7)
NEUTROS PCT: 64 % (ref 43–77)
Platelets: 191 10*3/uL (ref 150–400)
RBC: 3.84 MIL/uL — AB (ref 4.22–5.81)
RDW: 11.7 % (ref 11.5–15.5)
WBC: 4 10*3/uL (ref 4.0–10.5)

## 2014-02-09 LAB — TROPONIN I

## 2014-02-09 MED ORDER — PROMETHAZINE HCL 12.5 MG PO TABS
12.5000 mg | ORAL_TABLET | Freq: Once | ORAL | Status: AC
Start: 2014-02-09 — End: 2014-02-09
  Administered 2014-02-09: 12.5 mg via ORAL
  Filled 2014-02-09: qty 1

## 2014-02-09 NOTE — ED Notes (Signed)
Pt. Stated, I've had this headache with a knot on the back of head and I have a body aches for years and years.

## 2014-02-09 NOTE — Discharge Instructions (Signed)

## 2014-02-09 NOTE — ED Provider Notes (Signed)
CSN: [id number]     Arrival date & time 02/09/14  0814 History   First MD Initiated Contact with Patient 02/09/14 0912     Chief Complaint  Patient presents with  . Headache    "I have a knot on the back of my head"  . Generalized Body Aches     (Consider location/radiation/quality/duration/timing/severity/associated sxs/prior Treatment) HPI Comments: Patient presents to the ER for evaluation of "aching all over". Patient reports that he has a headache which has had since 1998. He reports that his aching in his shoulders and most of the rest of his body. This has been ongoing for "years". Patient denies any fever, chills. No chest pain or shortness of breath. He has had some slight cough. There has not been any vomiting, diarrhea.  Patient is a 80 y.o. male presenting with headaches.  Headache   Past Medical History  Diagnosis Date  . Hypertension   . Brain tumor   . Hypercholesteremia   . RJJOACZY(606.3)    Past Surgical History  Procedure Laterality Date  . Shoulder surgery      right  . Brain surgery      x3   Family History  Problem Relation Age of Onset  . Appendicitis Father    History  Substance Use Topics  . Smoking status: Former Smoker    Quit date: 09/19/1976  . Smokeless tobacco: Never Used  . Alcohol Use: No    Review of Systems  Musculoskeletal: Positive for arthralgias.  Neurological: Positive for headaches.  All other systems reviewed and are negative.     Allergies  Cephalexin  Home Medications   Prior to Admission medications   Medication Sig Start Date End Date Taking? Authorizing Provider  butalbital-acetaminophen-caffeine (FIORICET) 50-325-40 MG per tablet Take 1 tablet by mouth every 6 (six) hours as needed for headache. 11/12/13 11/12/14  Domenic Moras, PA-C  HYDROcodone-acetaminophen (NORCO/VICODIN) 5-325 MG per tablet Take 1 tablet by mouth every 8 (eight) hours as needed for moderate pain. 04/25/13   [name], MD   lisinopril-hydrochlorothiazide (PRINZIDE,ZESTORETIC) 20-25 MG per tablet Take 0.5 tablets by mouth daily.     Historical Provider, MD  LORazepam (ATIVAN) 1 MG tablet Take 1 mg by mouth at bedtime.    Historical Provider, MD   BP 115/60  Pulse 88  Temp(Src) 98.7 F (37.1 C) (Oral)  Resp 20  SpO2 97% Physical Exam  Constitutional: He is oriented to person, place, and time. He appears well-developed and well-nourished. No distress.  HENT:  Head: Normocephalic and atraumatic.  Right Ear: Hearing normal.  Left Ear: Hearing normal.  Nose: Nose normal.  Mouth/Throat: Oropharynx is clear and moist and mucous membranes are normal.  Eyes: Conjunctivae and EOM are normal. Pupils are equal, round, and reactive to light.  Neck: Normal range of motion. Neck supple.  Cardiovascular: Regular rhythm, S1 normal and S2 normal.  Exam reveals no gallop and no friction rub.   No murmur heard. Pulmonary/Chest: Effort normal and breath sounds normal. No respiratory distress. He exhibits no tenderness.  Abdominal: Soft. Normal appearance and bowel sounds are normal. There is no hepatosplenomegaly. There is no tenderness. There is no rebound, no guarding, no tenderness at McBurney's point and negative Murphy's sign. No hernia.  Musculoskeletal: Normal range of motion.  Neurological: He is alert and oriented to person, place, and time. He has normal strength. No cranial nerve deficit or sensory deficit. Coordination normal. GCS eye subscore is 4. GCS verbal subscore is 5. GCS motor  subscore is 6.  Skin: Skin is warm, dry and intact. No rash noted. No cyanosis.  Psychiatric: He has a normal mood and affect. His speech is normal and behavior is normal. Thought content normal.    ED Course  Procedures (including critical care time) Labs Review Labs Reviewed  CBC WITH DIFFERENTIAL - Abnormal; Notable for the following:    RBC 3.84 (*)    Hemoglobin 12.5 (*)    HCT 36.0 (*)    All other components within  normal limits  BASIC METABOLIC PANEL  URINALYSIS, ROUTINE W REFLEX MICROSCOPIC  TROPONIN I    Imaging Review No results found.   EKG Interpretation None      MDM   Final diagnoses:  None   chronic headache  Chronic pain  Patient presents to the ER with chronic complaints. Records, patient has been seen numerous times with complaints of this particular headache. He also has had frequent complaints of arthralgias, specifically in the shoulders, similar to today. Patient has a normal neurologic examination. There is no concern for intracranial abnormality. She has had chronic headaches, no imaging is necessary. Remainder of his workup today was unremarkable.    Orpah Greek, MD 02/09/14 [phone]

## 2014-02-10 NOTE — Care Management (Signed)
This CM was called to Nurse First to speak with patient. Patient states that he left his medication in the room when he was discharged this morning from [facility]. Staff investigated no meds found. Contacted patient's pharmacy [facility] . Spoke with pharmacist who has agreed to refill patient's medication. Pharmacy is closing at this time patient can pick up tomorrow after 1p. Made patient aware he verbalized understanding, and is agreeable teach back done, No further CM needs identified.

## 2014-02-11 ENCOUNTER — Telehealth (HOSPITAL_BASED_OUTPATIENT_CLINIC_OR_DEPARTMENT_OTHER): Payer: Self-pay | Admitting: Emergency Medicine

## 2014-07-08 ENCOUNTER — Emergency Department (HOSPITAL_COMMUNITY)
Admission: EM | Admit: 2014-07-08 | Discharge: 2014-07-08 | Disposition: A | Discharge status: Home / Self Care | Payer: Medicare Other | Attending: Emergency Medicine | Admitting: Emergency Medicine

## 2014-07-08 ENCOUNTER — Emergency Department (HOSPITAL_COMMUNITY): Payer: Medicare Other

## 2014-07-08 ENCOUNTER — Encounter (HOSPITAL_COMMUNITY): Payer: Self-pay | Admitting: *Deleted

## 2014-07-08 DIAGNOSIS — Z8639 Personal history of other endocrine, nutritional and metabolic disease: Secondary | ICD-10-CM | POA: Diagnosis not present

## 2014-07-08 DIAGNOSIS — R519 Headache, unspecified: Secondary | ICD-10-CM

## 2014-07-08 DIAGNOSIS — Z87891 Personal history of nicotine dependence: Secondary | ICD-10-CM | POA: Diagnosis not present

## 2014-07-08 DIAGNOSIS — Z85841 Personal history of malignant neoplasm of brain: Secondary | ICD-10-CM | POA: Diagnosis not present

## 2014-07-08 DIAGNOSIS — R51 Headache: Secondary | ICD-10-CM | POA: Diagnosis not present

## 2014-07-08 DIAGNOSIS — I10 Essential (primary) hypertension: Secondary | ICD-10-CM | POA: Insufficient documentation

## 2014-07-08 MED ORDER — HYDROCODONE-ACETAMINOPHEN 5-325 MG PO TABS
1.0000 | ORAL_TABLET | Freq: Once | ORAL | Status: AC
  Administered 2014-07-08: 1 via ORAL
  Filled 2014-07-08: qty 1

## 2014-07-08 NOTE — ED Notes (Signed)
Pt observed walking in the room and in the hallway, without assistance

## 2014-07-08 NOTE — ED Provider Notes (Signed)
CSN: [id number]     Arrival date & time 07/08/14  1128 History   First MD Initiated Contact with Patient 07/08/14 1204     Chief Complaint  Patient presents with  . Headache     (Consider location/radiation/quality/duration/timing/severity/associated sxs/prior Treatment) HPI Richard Mack is a 81 y.o. Black male who has a history of chronic headaches and has been seen multiple times in the ED for the same.  He presents today for a headache that he has had for years and says it is no worse than his previous headaches and is located in the same area. He could not explain why he came today if his symptoms were not worse than in the past. No radiation and no associated symptoms. He has not taken anything for pain. He denies any fever, chest pain, shortness of breath, nausea, vomiting, diarrhea, loss of consciousness, vision changes .  Past Medical History  Diagnosis Date  . Hypertension   . Brain tumor   . Hypercholesteremia   . GYIRSWNI(627.0)    Past Surgical History  Procedure Laterality Date  . Shoulder surgery      right  . Brain surgery      x3   Family History  Problem Relation Age of Onset  . Appendicitis Father    History  Substance Use Topics  . Smoking status: Former Smoker    Quit date: 09/19/1976  . Smokeless tobacco: Never Used  . Alcohol Use: No    Review of Systems  Constitutional: Negative for fever and chills.  Eyes: Negative for visual disturbance.  Respiratory: Negative for shortness of breath.   Cardiovascular: Negative for chest pain.  Gastrointestinal: Negative for abdominal pain.  Neurological: Positive for headaches. Negative for dizziness, weakness, light-headedness and numbness.  All other systems reviewed and are negative.     Allergies  Cephalexin  Home Medications   Prior to Admission medications   Medication Sig Start Date End Date Taking? Authorizing Provider  HYDROcodone-acetaminophen (NORCO/VICODIN) 5-325 MG per tablet Take 1 tablet  by mouth every 8 (eight) hours as needed for moderate pain. 04/25/13  Yes [name], MD  lisinopril-hydrochlorothiazide (PRINZIDE,ZESTORETIC) 20-25 MG per tablet Take 0.5 tablets by mouth daily.    Yes Historical Provider, MD  LORazepam (ATIVAN) 1 MG tablet Take 1 mg by mouth at bedtime as needed for sleep.    Yes Historical Provider, MD   BP 109/60 mmHg  Pulse 70  Temp(Src) 98.2 F (36.8 C) (Oral)  Resp 16  SpO2 100% Physical Exam  Constitutional: He is oriented to person, place, and time. He appears well-developed and well-nourished.  HENT:  Head: Normocephalic and atraumatic.  Eyes: Conjunctivae are normal.  Neck: Normal range of motion. Neck supple.  Cardiovascular: Normal rate, regular rhythm and normal heart sounds.   Pulmonary/Chest: Effort normal and breath sounds normal.  Abdominal: Soft. There is no tenderness.  Musculoskeletal: Normal range of motion.  Neurological: He is alert and oriented to person, place, and time.  He has right sided upper extremity weakness.   Skin: Skin is warm and dry.  Nursing note and vitals reviewed.   ED Course  Procedures (including critical care time) Labs Review Labs Reviewed - No data to display  Imaging Review Ct Head Wo Contrast  07/08/2014   CLINICAL DATA:  Chronic headaches.  EXAM: CT HEAD WITHOUT CONTRAST  TECHNIQUE: Contiguous axial images were obtained from the base of the skull through the vertex without intravenous contrast.  COMPARISON:  April 25, 2013  FINDINGS:  There is no midline shift, hydrocephalus, or mass. No acute hemorrhage or acute transcortical infarct is identified. There is chronic diffuse atrophy. Chronic bilateral periventricular white matter small vessel ischemic changes noted. Small old lacune infarctions are identified in bilateral basal ganglia unchanged. The visualized sinuses are clear. The bony calvarium is intact.  IMPRESSION: No focal acute intracranial abnormality identified. Chronic diffuse atrophy.  Chronic bilateral periventricular white matter small vessel ischemic change.   Electronically Signed   By: Abelardo Diesel M.D.   On: 07/08/2014 15:17     EKG Interpretation None      MDM   Final diagnoses:  Headache   He is well appearing and vitals are stable. He is a poor historian and his living situation is unclear. He is asking for food. Will get CT scan do to unreliability and to r/o any intracranial abnormality. It is unknown whether the right upper extremity weakness is new or old.  His head CT is normal for his age and there is no evidence of acute intracranial abnormality. Patient is eating [place] sandwich.   The nurse asked for social worker consult since the patient admits he has not eaten in several days.  His wife does not live with him but she was concerned since she could not get a hold of him for a few days.  His daughter in law will be taking him home.   15:45 I spoke with the social worker in the ED and she said the patient makes his own food, he did eat earlier today and is able to care for himself and drive.     Richard Glazier, PA-C 07/08/14 1932  Richard Biles, MD 07/09/14 [phone]

## 2014-07-08 NOTE — ED Notes (Addendum)
Spoke with pt's wife Phong Isenberg [phone]) who sts they are separated and she was asked by his family from [facility] to call for welfare check because he wasn't answering his phone (pt sts his phone is disconnected). Pt's wife sts pt has chronic headaches for which he sees his "regular doctor and everything checked all right" and takes hydrocodone as needed for it.

## 2014-07-08 NOTE — ED Notes (Signed)
Per EMS police and EMS were called in to pt's place of residence by his wife (who currently resides someplace else) because he didn't answer his phone since last night. According to EMS pt goes and visits his wife during the day but is not allowed to spend the night at her place of residence. Per EMS pt currently has no complaints, EMS sts pt's  neighbors reported that pt is ambulatory and driving everyday to visit his wife.

## 2014-07-08 NOTE — ED Notes (Signed)
Bed: VW09 Expected date:  Expected time:  Means of arrival:  Comments: EMS- welfare check, no complaint

## 2014-07-08 NOTE — Progress Notes (Signed)
CSW received consult from PA to speak with pt at bedside regarding access to food and eating habits due to family concerns.  CSW met with pt at bedside. Pt states that he does live alone, but his wife comes to stay with him sometimes. PA, informed CSW that pt is separated.   Pt informed CSW that he presents to [facility] due to headaches. Pt states that the reason he has been not eating much lately due to his headaches. He informed CSW that he made his own meal today. He also states that his wife sometimes prepares his meals. Pt states that he is able to drive.  Pt informed CSW that he receives income from [employer] He states that his check comes on the 3rd of every month. He states that he buys his own food. Pt States that he is able to complete his ADL's independently and that he can drive.  Willette Brace [phone] ED CSW 07/08/2014 4:54 PM

## 2014-07-08 NOTE — Discharge Instructions (Signed)
° °  General Headache Without Cause A general headache is pain or discomfort felt around the head or neck area. The cause may not be found.  HOME CARE   Keep all doctor visits.  Only take medicines as told by your doctor.  Lie down in a dark, quiet room when you have a headache.  Keep a journal to find out if certain things bring on headaches. For example, write down:  What you eat and drink.  How much sleep you get.  Any change to your diet or medicines.  Relax by getting a massage or doing other relaxing activities.  Put ice or heat packs on the head and neck area as told by your doctor.  Lessen stress.  Sit up straight. Do not tighten (tense) your muscles.  Quit smoking if you smoke.  Lessen how much alcohol you drink.  Lessen how much caffeine you drink, or stop drinking caffeine.  Eat and sleep on a regular schedule.  Get 7 to 9 hours of sleep, or as told by your doctor.  Keep lights dim if bright lights bother you or make your headaches worse. GET HELP RIGHT AWAY IF:   Your headache becomes really bad.  You have a fever.  You have a stiff neck.  You have trouble seeing.  Your muscles are weak, or you lose muscle control.  You lose your balance or have trouble walking.  You feel like you will pass out (faint), or you pass out.  You have really bad symptoms that are different than your first symptoms.  You have problems with the medicines given to you by your doctor.  Your medicines do not work.  Your headache feels different than the other headaches.  You feel sick to your stomach (nauseous) or throw up (vomit). MAKE SURE YOU:   Understand these instructions.  Will watch your condition.  Will get help right away if you are not doing well or get worse. Document Released: 01/27/2008 Document Revised: 07/12/2011 Document Reviewed: 04/09/2011 [facility] Patient Information 2015 [place], [place]. This information is not intended to replace advice  given to you by your health care provider. Make sure you discuss any questions you have with your health care provider.  F/u with your primary care physician.  I have also referred you to neurology for further evaluation of your headache.

## 2014-12-20 ENCOUNTER — Encounter (HOSPITAL_COMMUNITY): Payer: Self-pay | Admitting: *Deleted

## 2014-12-20 ENCOUNTER — Emergency Department (HOSPITAL_COMMUNITY)
Admission: EM | Admit: 2014-12-20 | Discharge: 2014-12-20 | Disposition: A | Discharge status: Home / Self Care | Payer: Medicare Other | Attending: Emergency Medicine | Admitting: Emergency Medicine

## 2014-12-20 DIAGNOSIS — E860 Dehydration: Secondary | ICD-10-CM | POA: Diagnosis not present

## 2014-12-20 DIAGNOSIS — I10 Essential (primary) hypertension: Secondary | ICD-10-CM | POA: Diagnosis not present

## 2014-12-20 DIAGNOSIS — Z79899 Other long term (current) drug therapy: Secondary | ICD-10-CM | POA: Insufficient documentation

## 2014-12-20 DIAGNOSIS — Z87891 Personal history of nicotine dependence: Secondary | ICD-10-CM | POA: Diagnosis not present

## 2014-12-20 DIAGNOSIS — Z86018 Personal history of other benign neoplasm: Secondary | ICD-10-CM | POA: Diagnosis not present

## 2014-12-20 DIAGNOSIS — N289 Disorder of kidney and ureter, unspecified: Secondary | ICD-10-CM | POA: Diagnosis not present

## 2014-12-20 DIAGNOSIS — M542 Cervicalgia: Secondary | ICD-10-CM | POA: Diagnosis present

## 2014-12-20 LAB — CBC
HCT: 31.3 % — ABNORMAL LOW (ref 39.0–52.0)
Hemoglobin: 10.8 g/dL — ABNORMAL LOW (ref 13.0–17.0)
MCH: 32.4 pg (ref 26.0–34.0)
MCHC: 34.5 g/dL (ref 30.0–36.0)
MCV: 94 fL (ref 78.0–100.0)
Platelets: 223 K/uL (ref 150–400)
RBC: 3.33 MIL/uL — ABNORMAL LOW (ref 4.22–5.81)
RDW: 11.5 % (ref 11.5–15.5)
WBC: 3.4 K/uL — ABNORMAL LOW (ref 4.0–10.5)

## 2014-12-20 LAB — BASIC METABOLIC PANEL
Anion gap: 10 (ref 5–15)
BUN: 36 mg/dL — AB (ref 6–20)
CO2: 25 mmol/L (ref 22–32)
Calcium: 10.9 mg/dL — ABNORMAL HIGH (ref 8.9–10.3)
Chloride: 103 mmol/L (ref 101–111)
Creatinine, Ser: 1.75 mg/dL — ABNORMAL HIGH (ref 0.61–1.24)
GFR, EST AFRICAN AMERICAN: 40 mL/min — AB (ref >60)
GFR, EST NON AFRICAN AMERICAN: 34 mL/min — AB (ref >60)
Glucose, Bld: 107 mg/dL — ABNORMAL HIGH (ref 65–99)
POTASSIUM: 4.3 mmol/L (ref 3.5–5.1)
SODIUM: 138 mmol/L (ref 135–145)

## 2014-12-20 MED ORDER — SODIUM CHLORIDE 0.9 % IV BOLUS (SEPSIS)
1000.0000 mL | Freq: Once | INTRAVENOUS | Status: AC
  Administered 2014-12-20: 1000 mL via INTRAVENOUS

## 2014-12-20 NOTE — Discharge Instructions (Signed)
Return to the ED with any concerns including vomiting, difficulty breathing, abdominal pain or chest pain, fainting, decreased level of alertness/lethargy, or any other alarming symptoms  You should be sure to increase your intake of fluids, we have contacted Meals [employer] Wheels to assist you with getting meals to your home  You should have your kidney labs re-checked in the next week by your primary care doctor

## 2014-12-20 NOTE — ED Notes (Signed)
Pt states he was worried about a knot in the back right side of head.  Denies any pain, SOB, chest or abdominal pain. No N/V/D  Pt has hx of anxiety and states he feels worried but no medical complaints except knot.

## 2014-12-20 NOTE — ED Provider Notes (Signed)
CSN: [id number]     Arrival date & time 12/20/14  1036 History   First MD Initiated Contact with Patient 12/20/14 1045     Chief Complaint  Patient presents with  . Neck Pain     (Consider location/radiation/quality/duration/timing/severity/associated sxs/prior Treatment) HPI  A LEVEL 5 CAVEAT PERTAINS DUE TO POOR HISTORIAN Pt presenting with c/o not eating in 2 days.  He states that he has no food in his house and that is why he hasn't eaten.  He states this is making him feel sick.  He states he can drive and go to the grocery store but he hasn't.  He frequently mentions that he is separated from his wife and she now lives in a different apartment.  When asked specifically about the know on the side of his head he states he has an appointment to see a doctor about this.     Past Medical History  Diagnosis Date  . Hypertension   . Brain tumor   . Hypercholesteremia   . HENIDPOE(423.5)    Past Surgical History  Procedure Laterality Date  . Shoulder surgery      right  . Brain surgery      x3   Family History  Problem Relation Age of Onset  . Appendicitis Father    Social History  Substance Use Topics  . Smoking status: Former Smoker    Quit date: 09/19/1976  . Smokeless tobacco: Never Used  . Alcohol Use: No    Review of Systems  UNABLE TO OBTAIN ROS DUE TO LEVEL 5 CAVEAT    Allergies  Cephalexin  Home Medications   Prior to Admission medications   Medication Sig Start Date End Date Taking? Authorizing Provider  HYDROcodone-acetaminophen (NORCO/VICODIN) 5-325 MG per tablet Take 1 tablet by mouth every 8 (eight) hours as needed for moderate pain. 04/25/13  Yes [name], MD  lisinopril-hydrochlorothiazide (PRINZIDE,ZESTORETIC) 20-25 MG per tablet Take 0.5 tablets by mouth daily.    Yes Historical Provider, MD  LORazepam (ATIVAN) 1 MG tablet Take 1 mg by mouth at bedtime as needed for sleep.    Yes Historical Provider, MD   BP 121/75 mmHg  Pulse 85   Temp(Src) 98.1 F (36.7 C) (Oral)  Resp 18  Ht 5\' 6"  (1.676 m)  Wt 140 lb (63.504 kg)  BMI 22.61 kg/m2  SpO2 100%  Vitals reviewed Physical Exam  Physical Examination: General appearance - alert, well appearing, and in no distress Mental status - alert, oriented to person, place, and time Eyes - pupils equal and reactive, extraocular eye movements intact Mouth - mucous membranes moist, pharynx normal without lesions Chest - clear to auscultation, no wheezes, rales or rhonchi, symmetric air entry Heart - normal rate, regular rhythm, normal S1, S2, no murmurs, rubs, clicks or gallops Abdomen - soft, nontender, nondistended, no masses or organomegaly Neurological - alert, oriented, normal speech,  Extremities - peripheral pulses normal, no pedal edema, no clubbing or cyanosis Skin - normal coloration and turgor, no rashes,  ED Course  Procedures (including critical care time) Labs Review Labs Reviewed  CBC - Abnormal; Notable for the following:    WBC 3.4 (*)    RBC 3.33 (*)    Hemoglobin 10.8 (*)    HCT 31.3 (*)    All other components within normal limits  BASIC METABOLIC PANEL - Abnormal; Notable for the following:    Glucose, Bld 107 (*)    BUN 36 (*)    Creatinine, Ser 1.75 (*)  Calcium 10.9 (*)    GFR calc non Af Amer 34 (*)    GFR calc Af Amer 40 (*)    All other components within normal limits    Imaging Review No results found. I have personally reviewed and evaluated these images and lab results as part of my medical decision-making.   EKG Interpretation None      MDM   Final diagnoses:  Renal insufficiency  Dehydration    Pt presenting with c/o feeling sick- he is in mild renal insufficiency with elevation in BUN c/w prerenal dehydration- pt treated with IV fluids.  I am concerned that he may not have access to much food- pt states he lives alone and can drive, but for some reason does not have much food in his home.  I have requested that case  management come to see the patient.    2:03 PM d/w social work, who will see patient and she has already signed him up for meals on wheels.  He has been given a sandwich and another sandwich to take home for later, he is drinking liquids in the ED.  His wife has come and she states she will be able to help him as well.    Alfonzo Beers, MD 12/21/14 [phone]

## 2014-12-20 NOTE — ED Notes (Signed)
Gave pt a [place] sandwich and gingerale  Family at bedside.

## 2014-12-20 NOTE — Care Management Note (Signed)
Case Management Note  Patient Details  Name: Richard Mack MRN: [id number] Date of Birth: 1928-06-20  Subjective/Objective:                  Pt presenting with c/o not eating in 2 days. He states that he has no food in his house and that is why he hasn't eaten.//Home alone.  Action/Plan: Follow for disposition needs.  Expected Discharge Date:      12/20/14            Expected Discharge Plan:  Home/Self Care  In-House Referral:     Discharge planning Services  CM Consult  Post Acute Care Choice:  NA Choice offered to:  NA  DME Arranged:    DME Agency:     HH Arranged:    HH Agency:     Status of Service:  Completed, signed off  Medicare Important Message Given:    Date Medicare IM Given:    Medicare IM give by:    Date Additional Medicare IM Given:    Additional Medicare Important Message give by:     If discussed at [place] of Stay Meetings, dates discussed:    Additional Comments: NCM consulted to assist pt with obtaining food.  NCM contacted [facility] to have pt evaluated for [employer].   Pt ex-wife in room, asked for a meal.  NCM provided a sandwich and ginger ale.  She also asked for a meal for later for ex-husband, NCM provided another sandwich.  Fuller Mandril, RN 12/20/2014, 2:07 PM

## 2015-01-08 ENCOUNTER — Ambulatory Visit: Payer: Medicare Other | Admitting: Endocrinology

## 2015-01-23 ENCOUNTER — Ambulatory Visit: Payer: Medicare Other | Admitting: Neurology

## 2016-05-05 LAB — CBC AND DIFFERENTIAL
HEMATOCRIT: 38 — AB (ref 41–53)
Hemoglobin: 12.7 — AB (ref 13.5–17.5)
NEUTROS ABS: 3
Platelets: 203 (ref 150–399)
WBC: 4.9

## 2016-05-05 LAB — HEPATIC FUNCTION PANEL
ALK PHOS: 112 (ref 25–125)
ALT: 10 (ref 10–40)
AST: 12 — AB (ref 14–40)
BILIRUBIN, TOTAL: 0.3

## 2016-05-05 LAB — BASIC METABOLIC PANEL
BUN: 13 (ref 4–21)
Creatinine: 1 (ref 0.6–1.3)
GLUCOSE: 95
POTASSIUM: 4.3 (ref 3.4–5.3)
SODIUM: 140 (ref 137–147)

## 2016-06-08 ENCOUNTER — Emergency Department (HOSPITAL_COMMUNITY)
Admission: EM | Admit: 2016-06-08 | Discharge: 2016-06-08 | Disposition: A | Discharge status: Home / Self Care | Payer: Medicare Other | Attending: Emergency Medicine | Admitting: Emergency Medicine

## 2016-06-08 ENCOUNTER — Encounter (HOSPITAL_COMMUNITY): Payer: Self-pay | Admitting: *Deleted

## 2016-06-08 DIAGNOSIS — Z87891 Personal history of nicotine dependence: Secondary | ICD-10-CM | POA: Diagnosis not present

## 2016-06-08 DIAGNOSIS — D649 Anemia, unspecified: Secondary | ICD-10-CM | POA: Diagnosis not present

## 2016-06-08 DIAGNOSIS — I10 Essential (primary) hypertension: Secondary | ICD-10-CM | POA: Insufficient documentation

## 2016-06-08 DIAGNOSIS — Z79899 Other long term (current) drug therapy: Secondary | ICD-10-CM | POA: Insufficient documentation

## 2016-06-08 DIAGNOSIS — N289 Disorder of kidney and ureter, unspecified: Secondary | ICD-10-CM | POA: Diagnosis not present

## 2016-06-08 DIAGNOSIS — R531 Weakness: Secondary | ICD-10-CM

## 2016-06-08 DIAGNOSIS — F0390 Unspecified dementia without behavioral disturbance: Secondary | ICD-10-CM | POA: Diagnosis present

## 2016-06-08 LAB — RAPID URINE DRUG SCREEN, HOSP PERFORMED
Amphetamines: NOT DETECTED
Barbiturates: NOT DETECTED
Benzodiazepines: NOT DETECTED
COCAINE: NOT DETECTED
Opiates: NOT DETECTED
TETRAHYDROCANNABINOL: NOT DETECTED

## 2016-06-08 LAB — CBC WITH DIFFERENTIAL/PLATELET
Basophils Absolute: 0 10*3/uL (ref 0.0–0.1)
Basophils Relative: 0 %
EOS ABS: 0.1 10*3/uL (ref 0.0–0.7)
EOS PCT: 2 %
HCT: 35.1 % — ABNORMAL LOW (ref 39.0–52.0)
HEMOGLOBIN: 12.1 g/dL — AB (ref 13.0–17.0)
LYMPHS ABS: 1 10*3/uL (ref 0.7–4.0)
Lymphocytes Relative: 21 %
MCH: 31.9 pg (ref 26.0–34.0)
MCHC: 34.5 g/dL (ref 30.0–36.0)
MCV: 92.6 fL (ref 78.0–100.0)
MONO ABS: 0.6 10*3/uL (ref 0.1–1.0)
MONOS PCT: 11 %
Neutro Abs: 3.3 10*3/uL (ref 1.7–7.7)
Neutrophils Relative %: 66 %
PLATELETS: 184 10*3/uL (ref 150–400)
RBC: 3.79 MIL/uL — ABNORMAL LOW (ref 4.22–5.81)
RDW: 11.9 % (ref 11.5–15.5)
WBC: 5 10*3/uL (ref 4.0–10.5)

## 2016-06-08 LAB — COMPREHENSIVE METABOLIC PANEL
ALT: 13 U/L — ABNORMAL LOW (ref 17–63)
AST: 18 U/L (ref 15–41)
Albumin: 4.8 g/dL (ref 3.5–5.0)
Alkaline Phosphatase: 79 U/L (ref 38–126)
Anion gap: 11 (ref 5–15)
BILIRUBIN TOTAL: 0.9 mg/dL (ref 0.3–1.2)
BUN: 32 mg/dL — AB (ref 6–20)
CALCIUM: 11.9 mg/dL — AB (ref 8.9–10.3)
CO2: 23 mmol/L (ref 22–32)
Chloride: 104 mmol/L (ref 101–111)
Creatinine, Ser: 1.59 mg/dL — ABNORMAL HIGH (ref 0.61–1.24)
GFR, EST AFRICAN AMERICAN: 44 mL/min — AB (ref >60)
GFR, EST NON AFRICAN AMERICAN: 38 mL/min — AB (ref >60)
Glucose, Bld: 117 mg/dL — ABNORMAL HIGH (ref 65–99)
Potassium: 3.9 mmol/L (ref 3.5–5.1)
Sodium: 138 mmol/L (ref 135–145)
TOTAL PROTEIN: 8.1 g/dL (ref 6.5–8.1)

## 2016-06-08 LAB — SALICYLATE LEVEL

## 2016-06-08 LAB — ACETAMINOPHEN LEVEL

## 2016-06-08 LAB — ETHANOL: Alcohol, Ethyl (B): <5 mg/dL (ref <5)

## 2016-06-08 MED ORDER — ACETAMINOPHEN 325 MG PO TABS
650.0000 mg | ORAL_TABLET | Freq: Once | ORAL | Status: AC
  Administered 2016-06-08: 650 mg via ORAL
  Filled 2016-06-08: qty 2

## 2016-06-08 NOTE — ED Notes (Signed)
Pt is alert & oriented to day (Monday) & Elvina Sidle.  Pt unable to name year or President.  Pt is also HOH.

## 2016-06-08 NOTE — ED Notes (Signed)
Pt will be discharged with stepdaughter home. She will be here to pick him up after she gets off work at [employer].

## 2016-06-08 NOTE — ED Notes (Signed)
Pt wife had called to checking on patient , and would like to be updated on pt status in the morning.

## 2016-06-08 NOTE — Progress Notes (Signed)
CSW staffed case with patient's EDP. EPD reported that she would prepare patient's paperwork for discharge. CSW contacted patient's step daughter and informed her that patient is being discharged and needs transportation home. Patient's stepdaughter reported that she gets off work at [employer] and will be able to pick up patient at approximately 5pm today 06/08/2016. CSW provided patient's RN with resources about [place] [place] [facility] for patient to take home.   CSW contacted by patient's wife and patient's wife informed CSW that her daughter would be picking up the patient after she gets off work, [place] thanked patient's wife for the information. Patient's wife requested to speak with patient, CSW informed patient that his wife wanted to speak with him and provided patient with phone.

## 2016-06-08 NOTE — ED Provider Notes (Signed)
[facility] DEPT Provider Note   CSN: [id number] Arrival date & time: 06/08/16  [id number]  By signing my name below, I, Richard Mack, attest that this documentation has been prepared under the direction and in the presence of [name], MD. Electronically Signed: Oleh Mack, Scribe. 06/08/16. 1:57 AM.   History   Chief Complaint Chief Complaint  Patient presents with  . Ingestion   LEVEL 5 CAVEAT DUE TO DEMENTIA  HPI Bron Mack is a 83 y.o. male who presents to the ED for evaluation following suspected drug overdosing. According to medic report, the patient's ex-wife called the police out of concern he was taking took much of his medication than prescribed. Police arrive at the ex-wife's apartment, where the patient is staying temporarily, to find an empty Hydrocodone pill bottle which was filled mid-January and contained 90 pills. The ex-wife is also concerned that the patient is unable to care for himself at home.   When speaking to the patient, he states that he is taking "2 pills per day" for "headaches" he is experiencing. He denies any nausea or vomiting.   HPI  Past Medical History:  Diagnosis Date  . Brain tumor ([place])   . Chronic constipation   . Headache(784.0)   . Hypercalcemia   . Hypercholesteremia   . Hypertension   . Insomnia   . Irregular heartbeat     Patient Active Problem List   Diagnosis Date Noted  . Brain tumor ([place])   . Hypertension   . SHOULDER PAIN, RIGHT 07/01/2009  . OSTEOPOROSIS 07/01/2009  . PRIMARY HYPERPARATHYROIDISM 01/09/2009  . HYPERCHOLESTEROLEMIA, MIXED 01/09/2009  . HYPERCALCEMIA 01/09/2009  . ANXIETY STATE, UNSPECIFIED 01/09/2009  . HYPERTENSION 01/09/2009  . INSOMNIA UNSPECIFIED 01/09/2009  . HEADACHE 01/09/2009    Past Surgical History:  Procedure Laterality Date  . BRAIN SURGERY     x3  . SHOULDER SURGERY     right       Home Medications    Prior to Admission medications   Medication Sig Start Date End  Date Taking? Authorizing Provider  lisinopril-hydrochlorothiazide (PRINZIDE,ZESTORETIC) 20-25 MG per tablet Take 0.5 tablets by mouth daily.    Yes Historical Provider, MD  LORazepam (ATIVAN) 1 MG tablet Take 1 mg by mouth at bedtime as needed for sleep.    Yes Historical Provider, MD  HYDROcodone-acetaminophen (NORCO/VICODIN) 5-325 MG per tablet Take 1 tablet by mouth every 8 (eight) hours as needed for moderate pain. Patient not taking: Reported on 06/08/2016 04/25/13   [name], MD    Family History Family History  Problem Relation Age of Onset  . Appendicitis Father   . AAA (abdominal aortic aneurysm) Father     Social History Social History  Substance Use Topics  . Smoking status: Former Smoker    Quit date: 09/19/1976  . Smokeless tobacco: Never Used  . Alcohol use No     Allergies   Cephalexin   Review of Systems Review of Systems  Unable to perform ROS: Dementia     Physical Exam Updated Vital Signs BP 129/98 (BP Location: Left Arm)   Pulse 99   Temp 97.8 F (36.6 C)   Resp 16   Ht 5\' 5"  (1.651 m)   Wt 141 lb 12.1 oz (64.3 kg)   SpO2 97%   BMI 23.59 kg/m   Physical Exam  Constitutional: He appears well-developed and well-nourished.  HENT:  Head: Normocephalic and atraumatic.  Eyes: EOM are normal. Pupils are equal, round, and reactive to light.  Neck: Normal range of motion. Neck supple. No JVD present.  Cardiovascular: Normal rate, regular rhythm and normal heart sounds.   No murmur heard. Pulmonary/Chest: Effort normal and breath sounds normal. He has no wheezes. He has no rales. He exhibits no tenderness.  Abdominal: Soft. Bowel sounds are normal. He exhibits no distension and no mass. There is no tenderness.  Musculoskeletal: Normal range of motion. He exhibits no edema.  Lymphadenopathy:    He has no cervical adenopathy.  Neurological: He is alert. No cranial nerve deficit. He exhibits normal muscle tone. Coordination normal.  Oriented to  person and place. Not oriented to time.   Skin: Skin is warm and dry. No rash noted.  Psychiatric: He has a normal mood and affect. His behavior is normal. Judgment and thought content normal.  Nursing note and vitals reviewed.    ED Treatments / Results  DIAGNOSTIC STUDIES: Oxygen Saturation is 97 percent on room air which is normal by my interpretation.    COORDINATION OF CARE: 2:02 AM First encounter.  Labs (all labs ordered are listed, but only abnormal results are displayed) Labs Reviewed  COMPREHENSIVE METABOLIC PANEL - Abnormal; Notable for the following:       Result Value   Glucose, Bld 117 (*)    BUN 32 (*)    Creatinine, Ser 1.59 (*)    Calcium 11.9 (*)    ALT 13 (*)    GFR calc non Af Amer 38 (*)    GFR calc Af Amer 44 (*)    All other components within normal limits  ACETAMINOPHEN LEVEL - Abnormal; Notable for the following:    Acetaminophen (Tylenol), Serum <10 (*)    All other components within normal limits  CBC WITH DIFFERENTIAL/PLATELET - Abnormal; Notable for the following:    RBC 3.79 (*)    Hemoglobin 12.1 (*)    HCT 35.1 (*)    All other components within normal limits  SALICYLATE LEVEL  ETHANOL  RAPID URINE DRUG SCREEN, HOSP PERFORMED  URINALYSIS, ROUTINE W REFLEX MICROSCOPIC   Procedures Procedures (including critical care time)  Medications Ordered in ED Medications  acetaminophen (TYLENOL) tablet 650 mg (650 mg Oral Given 06/08/16 0400)     Initial Impression / Assessment and Plan / ED Course  I have reviewed the triage vital signs and the nursing notes.  Pertinent labs & imaging results that were available during my care of the patient were reviewed by me and considered in my medical decision making (see chart for details).  Possible accidental overdose of hydrocodone-acetaminophen. According to know electronic control substance reporting system, he had a prescription for 90 tablets filled on January 14. The bladder was now empty which  would correlate with taking 4 tablets a day. Screening labs are obtained and he has normal hepatic function and a nondetectable acetaminophen level which would argue against actual acute or chronic overdose. Mild renal insufficiency is present along with hypercalcemia. However, on review of past records, creatinine is actually decreased from August 2016, and calcium has been chronically elevated to a similar level. There is some concern that he is not able to take care of himself at home. Social service will be consulted..  Final Clinical Impressions(s) / ED Diagnoses   Final diagnoses:  Weakness  Renal insufficiency  Hypercalcemia  Normochromic normocytic anemia    New Prescriptions New Prescriptions   No medications on file   I personally performed the services described in this documentation, which was scribed in my presence.  The recorded information has been reviewed and is accurate.      [name], MD [id number]

## 2016-06-08 NOTE — Progress Notes (Signed)
CSW spoke with patient at bedside. Patient alert and oriented to self and place. CSW inquired about patient's current living arrangement. Patient reports that he has been staying with his wife for about a month and that his wife does not want him there. Patient reports that he has a knot on his head. Patient is vague and poor historian.   CSW contacted patient's wife Kaleem Lores [phone]) to obtain collateral information. Patient's wife reports that patient has not been staying with her and only comes to sit with her. Patient's wife reported that patient put her out about 4 years ago and she got an apartment and she's not able to allow patient to stay with her. Patient's wife reports that she told patient to come to the hospital to be checked out because she believes he is overtaking his medication. Patient's wife reported that her daughter, the patient's step daughter [name]) sometimes helps the patient by taking him to doctor's appointments. CSW asked patient's wife for her daughter's contact information to gain additional collateral information and patient and his current living situation. Patient's wife provided CSW with daughter's contact information and requested to be updated about patient. Patient's wife reports that patient can continue to come and sit with her when he leaves the hospital she just wanted him to get checked out because she was worried about patient taking too much medication.   CSW contacted patient's daughter [name] [phone]) to gain additional collateral information. Patient's step daughter confirms that patient put his wife out about 4 years ago and that the patient's wife now stays in a home alone. Patient's step daughter reports that the patient is not able to drive right now and she has concerns about patient cooking for himself. Patient's daughter informed CSW that she is at work and requested to contact CSW at a later time. CSW agreed and thanked  patient's step daughter for information provided thus far.

## 2016-06-08 NOTE — ED Provider Notes (Signed)
Contacted by SW after she discussed patient at length with family. He is not suitable for placement and wants to go home, and family feels comfortable with discharge. His workup here has been reassuring and has not demonstrated any evidence of life-threatening overdose. SW reports that patient coherent and appropriate at time of discharge. Outpatient resources provided.   Sharlett Iles, MD 06/08/16 [phone]

## 2016-06-08 NOTE — Progress Notes (Signed)
CSW contacted by patient's step daughter [name]). Patient's step daughter reports that patient's family is coming from [place] and [place] on March 16th to check on patient. CSW asked patient's stepdaughter if she feels patient will benefit from having some assistance within the home and does she feel safe with patient returning home? Patient's stepdaughter reported that she thinks it would be helpful for patient to have someone to assist in the home and that she feels safe with patient returning to his home and continuing to sit with her mother. Patient's stepdaughter reports that she is the only person locally that helps patient and that she takes him to his appointments. CSW inquired about the knot patient reported having on his head, patient's step daughter reported that it was a result of a surgery patient had years ago. Patient's stepdaughter requested to be contacted during her break in regards to patient and discharge planning. Patient's step daughter agreed to pick patient up when discharged noting that she does not get off until 4pm.   CSW spoke with patient at bedside. CSW inquired about patient's plan for discharge. Patient reports that he wants to go stay with his wife at her apartment. Patient reports that he has a big house and doesn't want to be there by himself. CSW inquired about patient's ADLs. Patient reported that he cooks at his wife's apartment and that his home is not dirty. CSW asked patient if he was interested in having someone come to his home to help assist him with ADLs, patient reported that he does not know. Patient reports that he doesn't want to be at the hospital and requested to speak with his wife. CSW provided patient with phone, patient spoke with his wife. CSW inquired about patient's discharge planning when he got off the phone with his wife, patient told CSW "you're asking too many questions, I don't know what to say to you people right now". CSW ended  conversation with patient.   CSW contacted patient's wife to inquire about patient's ability to return to her home after discharge. Patient's wife reported that patient can come sit with her all day but is unable to stay because she stays in [place] and that do not allow people to stay. Patient's wife requested to be contacted with update on patient.   CSW contacted supervisor to staff case. Supervisor informed CSW that at this time patient was not suitable for placement from the ED. Supervisor recommended that CSW provide patient with information on private duty agencies that may be able to assist patient in the home.
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
## 2016-08-10 ENCOUNTER — Emergency Department (HOSPITAL_COMMUNITY)
Admission: EM | Admit: 2016-08-10 | Discharge: 2016-08-10 | Disposition: A | Discharge status: Home / Self Care | Payer: Medicare Other | Attending: Emergency Medicine | Admitting: Emergency Medicine

## 2016-08-10 ENCOUNTER — Encounter (HOSPITAL_COMMUNITY): Payer: Self-pay

## 2016-08-10 DIAGNOSIS — Z87891 Personal history of nicotine dependence: Secondary | ICD-10-CM | POA: Diagnosis not present

## 2016-08-10 DIAGNOSIS — G8929 Other chronic pain: Secondary | ICD-10-CM | POA: Insufficient documentation

## 2016-08-10 DIAGNOSIS — G4489 Other headache syndrome: Secondary | ICD-10-CM | POA: Diagnosis not present

## 2016-08-10 DIAGNOSIS — Z79899 Other long term (current) drug therapy: Secondary | ICD-10-CM | POA: Diagnosis not present

## 2016-08-10 DIAGNOSIS — R51 Headache: Secondary | ICD-10-CM | POA: Diagnosis not present

## 2016-08-10 DIAGNOSIS — I10 Essential (primary) hypertension: Secondary | ICD-10-CM | POA: Diagnosis not present

## 2016-08-10 MED ORDER — OXYCODONE-ACETAMINOPHEN 5-325 MG PO TABS
2.0000 | ORAL_TABLET | Freq: Once | ORAL | Status: AC
  Administered 2016-08-10: 2 via ORAL
  Filled 2016-08-10: qty 2

## 2016-08-10 MED ORDER — HYDROCODONE-ACETAMINOPHEN 5-325 MG PO TABS: 1.0000 | ORAL_TABLET | Freq: Three times a day (TID) | ORAL | 0 refills | Status: DC | PRN
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
## 2016-08-10 NOTE — ED Provider Notes (Signed)
[name] DEPT Provider Note   CSN: [id number] Arrival date & time: 08/10/16  0355     History   Chief Complaint Chief Complaint  Patient presents with  . Headache    HPI Richard Mack is a 83 y.o. male.  HPI   87yM presenting with headache. Pt's wife is also a pt in the ED and he seems more concerned about her. Apparently primary reason for the call was for her. He reports HA. Chronic. Denies acute change. Denies fever. No acute numbness, tingling or loss of strength.  Past Medical History:  Diagnosis Date  . Brain tumor ([place])   . Chronic constipation   . Headache(784.0)   . Hypercalcemia   . Hypercholesteremia   . Hypertension   . Insomnia   . Irregular heartbeat     Patient Active Problem List   Diagnosis Date Noted  . Brain tumor ([place])   . Hypertension   . SHOULDER PAIN, RIGHT 07/01/2009  . OSTEOPOROSIS 07/01/2009  . PRIMARY HYPERPARATHYROIDISM 01/09/2009  . HYPERCHOLESTEROLEMIA, MIXED 01/09/2009  . HYPERCALCEMIA 01/09/2009  . ANXIETY STATE, UNSPECIFIED 01/09/2009  . HYPERTENSION 01/09/2009  . INSOMNIA UNSPECIFIED 01/09/2009  . HEADACHE 01/09/2009    Past Surgical History:  Procedure Laterality Date  . BRAIN SURGERY     x3  . SHOULDER SURGERY     right       Home Medications    Prior to Admission medications   Medication Sig Start Date End Date Taking? Authorizing Provider  HYDROcodone-acetaminophen (NORCO/VICODIN) 5-325 MG per tablet Take 1 tablet by mouth every 8 (eight) hours as needed for moderate pain. 04/25/13   [name], MD  lisinopril-hydrochlorothiazide (PRINZIDE,ZESTORETIC) 20-25 MG per tablet Take 0.5 tablets by mouth daily.     Historical Provider, MD  LORazepam (ATIVAN) 1 MG tablet Take 1 mg by mouth at bedtime as needed for sleep.     Historical Provider, MD    Family History Family History  Problem Relation Age of Onset  . Appendicitis Father   . AAA (abdominal aortic aneurysm) Father     Social History Social  History  Substance Use Topics  . Smoking status: Former Smoker    Quit date: 09/19/1976  . Smokeless tobacco: Never Used  . Alcohol use No     Allergies   Cephalexin   Review of Systems Review of Systems  All systems reviewed and negative, other than as noted in HPI.   Physical Exam Updated Vital Signs BP (!) 146/84 (BP Location: Left Arm)   Pulse 88   Temp 98.5 F (36.9 C) (Oral)   Resp 18   SpO2 98%   Physical Exam  Constitutional: He appears well-developed and well-nourished. No distress.  HENT:  Head: Normocephalic and atraumatic.  Eyes: Conjunctivae are normal. Right eye exhibits no discharge. Left eye exhibits no discharge.  Neck: Neck supple.  Cardiovascular: Normal rate, regular rhythm and normal heart sounds.  Exam reveals no gallop and no friction rub.   No murmur heard. Pulmonary/Chest: Effort normal and breath sounds normal. No respiratory distress.  Abdominal: Soft. He exhibits no distension. There is no tenderness.  Musculoskeletal: He exhibits no edema or tenderness.  Neurological: He is alert.  Skin: Skin is warm and dry.  Psychiatric: He has a normal mood and affect. His behavior is normal. Thought content normal.  Nursing note and vitals reviewed.    ED Treatments / Results  Labs (all labs ordered are listed, but only abnormal results are displayed) Labs Reviewed - No  data to display  EKG  EKG Interpretation None       Radiology No results found.  Procedures Procedures (including critical care time)  Medications Ordered in ED Medications  oxyCODONE-acetaminophen (PERCOCET/ROXICET) 5-325 MG per tablet 2 tablet (not administered)     Initial Impression / Assessment and Plan / ED Course  I have reviewed the triage vital signs and the nursing notes.  Pertinent labs & imaging results that were available during my care of the patient were reviewed by me and considered in my medical decision making (see chart for details).      87yM with headaches. Chronic. Denies acute change. Says he wanted to come to ED because his wife was brought in today too. Typically takes hydrocodone for his headaches, but is currently out. I do not feel he requires imaging or further w/u with the chronicity of this complaint, no acute change in character and lack of other concerning symptoms. Given an oral dose of pain meds in ED. I typically do not use narcotics, but this is what he normally takes. Will provide a prescription for a small amount of hydrocodone but needs to FU with usual provider for additional.   Final Clinical Impressions(s) / ED Diagnoses   Final diagnoses:  Chronic intractable headache, unspecified headache type    New Prescriptions New Prescriptions   No medications on file     Virgel Manifold, MD 08/19/16 1027

## 2016-08-10 NOTE — ED Triage Notes (Signed)
Per [facility], Pt is coming from home with headache. Pt has a hx of chronic headaches and brain surgery. Pt was unable to report how long the headache has been bothering him and denies the headache to be any different than the past. Alert and oriented x3 per baseline. Pt is not normally oriented to time.

## 2016-08-10 NOTE — ED Notes (Signed)
Pt given sandwich and drink, escorted to [facility] room in ED.
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
## 2016-08-14 ENCOUNTER — Emergency Department (HOSPITAL_COMMUNITY)
Admission: EM | Admit: 2016-08-14 | Discharge: 2016-08-15 | Disposition: A | Discharge status: Home / Self Care | Payer: Medicare Other | Attending: Emergency Medicine | Admitting: Emergency Medicine

## 2016-08-14 ENCOUNTER — Encounter (HOSPITAL_COMMUNITY): Payer: Self-pay | Admitting: Emergency Medicine

## 2016-08-14 DIAGNOSIS — I10 Essential (primary) hypertension: Secondary | ICD-10-CM | POA: Diagnosis not present

## 2016-08-14 DIAGNOSIS — Z79899 Other long term (current) drug therapy: Secondary | ICD-10-CM | POA: Insufficient documentation

## 2016-08-14 DIAGNOSIS — M6281 Muscle weakness (generalized): Secondary | ICD-10-CM | POA: Diagnosis not present

## 2016-08-14 DIAGNOSIS — Z87891 Personal history of nicotine dependence: Secondary | ICD-10-CM | POA: Insufficient documentation

## 2016-08-14 DIAGNOSIS — R531 Weakness: Secondary | ICD-10-CM | POA: Diagnosis not present

## 2016-08-14 DIAGNOSIS — R2689 Other abnormalities of gait and mobility: Secondary | ICD-10-CM | POA: Diagnosis not present

## 2016-08-14 MED ORDER — LISINOPRIL-HYDROCHLOROTHIAZIDE 20-25 MG PO TABS
0.5000 | ORAL_TABLET | Freq: Every day | ORAL | Status: DC
Start: 2016-08-14 — End: 2016-08-14

## 2016-08-14 MED ORDER — HYDROCHLOROTHIAZIDE 12.5 MG PO CAPS
12.5000 mg | ORAL_CAPSULE | Freq: Every day | ORAL | Status: DC
  Administered 2016-08-15: 12.5 mg via ORAL
  Filled 2016-08-14: qty 1

## 2016-08-14 MED ORDER — LISINOPRIL 10 MG PO TABS
10.0000 mg | ORAL_TABLET | Freq: Every day | ORAL | Status: DC
  Administered 2016-08-15: 10 mg via ORAL
  Filled 2016-08-14: qty 1

## 2016-08-14 MED ORDER — LORAZEPAM 1 MG PO TABS: 1.0000 mg | ORAL_TABLET | Freq: Every evening | ORAL | Status: DC | PRN

## 2016-08-14 NOTE — ED Notes (Signed)
Pt eat all his [place] sandwich and all his apple sauce

## 2016-08-14 NOTE — ED Notes (Signed)
CSW at bedside.

## 2016-08-14 NOTE — ED Notes (Signed)
Up at bedside using urinal

## 2016-08-14 NOTE — ED Notes (Signed)
Pt up to the BR with assistance from the nurse.

## 2016-08-14 NOTE — ED Notes (Signed)
Pt dinner tray arrived. Pt eating and inquiring about his wife.

## 2016-08-14 NOTE — ED Triage Notes (Signed)
Per [facility], pt comes in accompanying wife. Pt wanted to come with wife. Per [facility], pt is unable to be at home by himself. Pt alert and oriented to self and birth date. Pt does not know date. Pt c/o headache. VSS.

## 2016-08-14 NOTE — Clinical Social Work Note (Addendum)
CSW was consulted because pt's spouse is also in the ED. Pt is unable to return home without spouse. Pt has dementia and it would not be safe for pt to return home without pt's spouse who will be sent to SNF at d/c. CSW consulted with [place] Surveyor, quantity. CSW spoke with pt's brother Jeneen Rinks [phone]) who lives in [place]. Jeneen Rinks states there is also a sister of the pt's in [place], [phone]) who is 82 and a sister who lives in [place], [phone]) who is 18. None of the pt's siblings are able to care for the pt until the pt's spouse returns home. Per pt's family pt would be unable to pay privately. CSW Surveyor, quantity has agreed to a week long LOG at a SNF while we would explore if the pt would qualify for Medicaid. Pt does have Medicare however, does not have a qualifying stay. CSW will continue to work on placement for a week long LOG. CSW has consulted [place] and [place] at this time--awaiting decision.  [place], [place]

## 2016-08-14 NOTE — ED Notes (Signed)
Pt placed in light blue colored disposable scrubs. Pt's belongings were bagged and are with pt.

## 2016-08-14 NOTE — ED Provider Notes (Signed)
[facility] DEPT Provider Note   CSN: [id number] Arrival date & time: 08/14/16  3790     History   Chief Complaint Chief Complaint  Patient presents with  . Weakness    HPI Richard Mack is a 83 y.o. male.  83 year old male with extensive past medical history including brain tumor, hypertension, hyperlipidemia who was brought in with his wife for weakness. EMS was called this morning for the patient's wife. They reported that their living conditions were horrible and they felt unsafe leaving the patient there. He and his wife currently live in separate apartments in the same complex. They noted that when ambulating he was too week to get back up the stairs to his apartment. The only reason the patient is in the ED is because of his wife and he denies any problems or need for evaluation. He has chronic daily headaches which are unchanged recently. He denies any concerns.   The history is provided by the patient.  Weakness     Past Medical History:  Diagnosis Date  . Brain tumor ([place])   . Chronic constipation   . Headache(784.0)   . Hypercalcemia   . Hypercholesteremia   . Hypertension   . Insomnia   . Irregular heartbeat     Patient Active Problem List   Diagnosis Date Noted  . Brain tumor ([place])   . Hypertension   . SHOULDER PAIN, RIGHT 07/01/2009  . OSTEOPOROSIS 07/01/2009  . PRIMARY HYPERPARATHYROIDISM 01/09/2009  . HYPERCHOLESTEROLEMIA, MIXED 01/09/2009  . HYPERCALCEMIA 01/09/2009  . ANXIETY STATE, UNSPECIFIED 01/09/2009  . HYPERTENSION 01/09/2009  . INSOMNIA UNSPECIFIED 01/09/2009  . HEADACHE 01/09/2009    Past Surgical History:  Procedure Laterality Date  . BRAIN SURGERY     x3  . SHOULDER SURGERY     right       Home Medications    Prior to Admission medications   Medication Sig Start Date End Date Taking? Authorizing Provider  HYDROcodone-acetaminophen (NORCO/VICODIN) 5-325 MG per tablet Take 1 tablet by mouth every 8 (eight) hours as needed  for moderate pain. 04/25/13   [name], MD  HYDROcodone-acetaminophen (NORCO/VICODIN) 5-325 MG tablet Take 1 tablet by mouth every 8 (eight) hours as needed for severe pain. 08/10/16   Virgel Manifold, MD  lisinopril-hydrochlorothiazide (PRINZIDE,ZESTORETIC) 20-25 MG per tablet Take 0.5 tablets by mouth daily.     Historical Provider, MD  LORazepam (ATIVAN) 1 MG tablet Take 1 mg by mouth at bedtime as needed for sleep.     Historical Provider, MD    Family History Family History  Problem Relation Age of Onset  . Appendicitis Father   . AAA (abdominal aortic aneurysm) Father     Social History Social History  Substance Use Topics  . Smoking status: Former Smoker    Quit date: 09/19/1976  . Smokeless tobacco: Never Used  . Alcohol use No     Allergies   Cephalexin   Review of Systems Review of Systems  Neurological: Positive for weakness.  All other systems reviewed and are negative.    Physical Exam Updated Vital Signs BP (!) 144/76 (BP Location: Right Arm)   Pulse 86   Temp 98.3 F (36.8 C) (Oral)   Resp 18   Ht 5\' 6"  (1.676 m)   Wt 141 lb (64 kg)   SpO2 100%   BMI 22.76 kg/m   Physical Exam  Constitutional: No distress.  Disheveled, elderly man awake and comfortable  HENT:  Head: Normocephalic and atraumatic.  Edentulous  Eyes: Conjunctivae are normal.  Neck: Neck supple.  Neurological: He is alert.  Oriented to person and place, able to follow basic directions, speech garbled partly due to lack of dentures Able to stand unassisted, shuffling, unsteady gait  Skin: Skin is warm and dry.  Psychiatric:  Disheveled, pleasant, calm  Nursing note and vitals reviewed.    ED Treatments / Results  Labs (all labs ordered are listed, but only abnormal results are displayed) Labs Reviewed - No data to display  EKG  EKG Interpretation None       Radiology No results found.  Procedures Procedures (including critical care time)  Medications Ordered  in ED Medications - No data to display   Initial Impression / Assessment and Plan / ED Course  I have reviewed the triage vital signs and the nursing notes.    Pt brought here because his wife called EMS and the EMS personnel felt that he was unsafe being left alone given concerning living conditions and his inability to walk all the way back to his apartment. He was comfortable on exam and stated he was fine and was not here to be medically evaluated himself. I contacted case And his judgment and social work, as I had evaluated his wife a few days ago had an ED visit and it is clear that there are some social concerns with their living situation.  4:25 PM Discussed patient's case at length with social work. Patient is awaiting possible placement, to be determined after further discussions with his wife's daughter.  Final Clinical Impressions(s) / ED Diagnoses   Final diagnoses:  None    New Prescriptions New Prescriptions   No medications on file     Sharlett Iles, MD 08/14/16 1625

## 2016-08-15 DIAGNOSIS — R4182 Altered mental status, unspecified: Secondary | ICD-10-CM | POA: Diagnosis not present

## 2016-08-15 DIAGNOSIS — M6250 Muscle wasting and atrophy, not elsewhere classified, unspecified site: Secondary | ICD-10-CM | POA: Diagnosis not present

## 2016-08-15 NOTE — ED Notes (Signed)
Encouraged pt to shower/bathe x 3 - refuses. Adam, EMT, also encouraged.

## 2016-08-15 NOTE — ED Notes (Signed)
Daughter, Peter Congo [phone] - aware [facility] transporting her mother to [place] and her father should be transported soon.

## 2016-08-15 NOTE — ED Notes (Signed)
Pt visited w/spouse in room across the hall. Offered pt to shower d/t body odor noted. Pt declined at this time. New socks applied to pt's feet.

## 2016-08-15 NOTE — NC FL2 (Signed)
  [place] LEVEL OF CARE SCREENING TOOL     IDENTIFICATION  Patient Name: Richard Mack Birthdate: 12/02/1928 Sex: male Admission Date (Current Location): 08/14/2016  [facility] and [place] Number:  Herbalist and Address:  [facility] [place] [address] [place], [place] [address]      Provider Number: [id number]  Attending Physician Name and Address:  No att. providers found  Relative Name and Phone Number:       Current Level of Care: SNF Recommended Level of Care: [place] Prior Approval Number:    Date Approved/Denied:   PASRR Number: [id number]  Discharge Plan: SNF    Current Diagnoses: Patient Active Problem List   Diagnosis Date Noted  . Brain tumor ([place])   . Hypertension   . SHOULDER PAIN, RIGHT 07/01/2009  . OSTEOPOROSIS 07/01/2009  . PRIMARY HYPERPARATHYROIDISM 01/09/2009  . HYPERCHOLESTEROLEMIA, MIXED 01/09/2009  . HYPERCALCEMIA 01/09/2009  . ANXIETY STATE, UNSPECIFIED 01/09/2009  . HYPERTENSION 01/09/2009  . INSOMNIA UNSPECIFIED 01/09/2009  . HEADACHE 01/09/2009    Orientation RESPIRATION BLADDER Height & Weight     Self  Normal Continent Weight: 141 lb (64 kg) Height:  5\' 6"  (167.6 cm)  BEHAVIORAL SYMPTOMS/MOOD NEUROLOGICAL BOWEL NUTRITION STATUS      Continent    AMBULATORY STATUS COMMUNICATION OF NEEDS Skin   Supervision Verbally Normal                       Personal Care Assistance Level of Assistance  Bathing, Feeding, Dressing Bathing Assistance: Limited assistance Feeding assistance: Independent Dressing Assistance: Limited assistance     Functional Limitations Info  Sight, Speech, Hearing Sight Info: Adequate Hearing Info: Impaired Speech Info: Adequate    SPECIAL CARE FACTORS FREQUENCY                       Contractures      Additional Factors Info                  Current Medications (08/15/2016):  This is the current hospital active medication  list Current Facility-Administered Medications  Medication Dose Route Frequency Provider Last Rate Last Dose  . hydrochlorothiazide (MICROZIDE) capsule 12.5 mg  12.5 mg Oral Daily Isla Pence, MD      . lisinopril (PRINIVIL,ZESTRIL) tablet 10 mg  10 mg Oral Daily Isla Pence, MD      . LORazepam (ATIVAN) tablet 1 mg  1 mg Oral QHS PRN Isla Pence, MD       Current Outpatient Prescriptions  Medication Sig Dispense Refill  . HYDROcodone-acetaminophen (NORCO/VICODIN) 5-325 MG per tablet Take 1 tablet by mouth every 8 (eight) hours as needed for moderate pain. 20 tablet 0  . HYDROcodone-acetaminophen (NORCO/VICODIN) 5-325 MG tablet Take 1 tablet by mouth every 8 (eight) hours as needed for severe pain. 10 tablet 0  . lisinopril-hydrochlorothiazide (PRINZIDE,ZESTORETIC) 20-25 MG per tablet Take 0.5 tablets by mouth daily.     Marland Kitchen LORazepam (ATIVAN) 1 MG tablet Take 1 mg by mouth at bedtime as needed for sleep.        Discharge Medications: Please see discharge summary/avs for a list of discharge medications.  Relevant Imaging Results:  Relevant Lab Results:   Additional Information SS#; [id number]  Raymondo Band, LCSWA

## 2016-08-15 NOTE — ED Notes (Signed)
Sitting in recliner - Eating lunch in spouse's room. Peter Congo, daughter, present.

## 2016-08-15 NOTE — ED Notes (Signed)
Pt visiting in spouse's room w/her and daughter.

## 2016-08-15 NOTE — Progress Notes (Signed)
Patient has accepted bed offer at [facility]. Facility has been informed of patient and family's selection. Per facility representative Tammy, patient can arrive to facility at anyime. CSW informed patient and family of transfer time. Patient and family appreciative of CSW services.   Patient to be transported via [facility] to facility listed above around 1:30pm. D/C AVS sent over to facility. No further needs were requested at this time. CSW to sign off.   Please re-consult if further CSW needs arise.   Terrye Dombrosky, LCSWA Clinical Social Worker [facility] Ph: [phone]    

## 2016-08-15 NOTE — ED Notes (Signed)
Pt woke up, ambulated to the bathroom with supervision. Pt returned to room looking through belongings for billfold, billfold in pants. Pt reoriented to time, pt returned to bed safely.

## 2016-08-15 NOTE — Clinical Social Work Placement (Signed)
   CLINICAL SOCIAL WORK PLACEMENT  NOTE  Date:  08/15/2016  Patient Details  Name: Richard Mack MRN: [id number] Date of Birth: 03/19/1929  Clinical Social Work is seeking post-discharge placement for this patient at the [place] level of care (*CSW will initial, date and re-position this form in  chart as items are completed):  Yes   Patient/family provided with [facility] Work Department's list of facilities offering this level of care within the geographic area requested by the patient (or if unable, by the patient's family).  Yes   Patient/family informed of their freedom to choose among providers that offer the needed level of care, that participate in Medicare, Medicaid or managed care program needed by the patient, have an available bed and are willing to accept the patient.      Patient/family informed of [facility]'s ownership interest in [facility] and [facility], as well as of the fact that they are under no obligation to receive care at these facilities.  PASRR submitted to EDS on 08/15/16     PASRR number received on 08/15/16     Existing PASRR number confirmed on       FL2 transmitted to all facilities in geographic area requested by pt/family on       FL2 transmitted to all facilities within larger geographic area on 08/15/16     Patient informed that his/her managed care company has contracts with or will negotiate with certain facilities, including the following:        Yes   Patient/family informed of bed offers received.  Patient chooses bed at  [facility])     Physician recommends and patient chooses bed at      Patient to be transferred to  [facility]) on 08/15/16.  Patient to be transferred to facility by  [facility])     Patient family notified on 08/15/16 of transfer.  Name of family member notified:  Peter Congo      PHYSICIAN Please prepare priority discharge summary, including medications     Additional  Comment:    _______________________________________________ Raymondo Band, LCSWA 08/15/2016, 10:55 AM

## 2016-08-15 NOTE — ED Notes (Signed)
PTAR transporting pt to [place] - room 129A - via stretcher. ALL belongings given to PTAR - jacket, shirt, shoes, pants w/belt - pt's wallet in pants, along w/keys and change.

## 2016-08-15 NOTE — ED Notes (Signed)
Daughter has now left. Requested to be called when pt leaves ED. 

## 2016-08-15 NOTE — ED Notes (Signed)
Daughter in w/pt.

## 2016-08-15 NOTE — ED Provider Notes (Signed)
Patient accepted to [facility]   Richard Muskrat, MD 08/15/16 1113

## 2016-08-15 NOTE — ED Notes (Addendum)
Pt wearing blue paper scrubs - has brown wallet under pillow, shoes on floor under bed, and shirt, pants w/change and key in left front pocket, belt, and jacket in bag on chair in room. Pt also wearing 2 watches - silver in color.

## 2016-08-15 NOTE — ED Notes (Addendum)
Lonna Cobb - [phone] - advised will call back for report.

## 2016-08-15 NOTE — ED Notes (Signed)
Pt states his wallet is missing - RN and NT assisted pt - unable to locate in pt's room. Pt states his daughter must have taken it. RN called Peter Congo who advised she did not take it and that he had it in her mother's room. Located wallet on floor. Daughter aware - states she tried to get pt to give it to her but he refused. Pt encouraged to place wallet in his pant's pocket - pt complied - located in back right pocket of pt's personal pants. Pt wearing blue paper scrubs.

## 2016-08-16 DIAGNOSIS — D649 Anemia, unspecified: Secondary | ICD-10-CM | POA: Diagnosis not present

## 2016-08-16 DIAGNOSIS — E039 Hypothyroidism, unspecified: Secondary | ICD-10-CM | POA: Diagnosis not present

## 2016-08-16 LAB — CBC AND DIFFERENTIAL
HCT: 38 % — AB (ref 41–53)
Hemoglobin: 12.9 g/dL — AB (ref 13.5–17.5)
NEUTROS ABS: 7 /uL
Platelets: 240 10*3/uL (ref 150–399)
WBC: 8.6 10^3/mL

## 2016-08-16 LAB — BASIC METABOLIC PANEL
BUN: 16 mg/dL (ref 4–21)
Creatinine: 1 mg/dL (ref 0.6–1.3)
GLUCOSE: 153 mg/dL
POTASSIUM: 4.1 mmol/L (ref 3.4–5.3)
SODIUM: 139 mmol/L (ref 137–147)

## 2016-08-16 LAB — TSH: TSH: 1.02 u[IU]/mL (ref 0.41–5.90)

## 2016-08-17 ENCOUNTER — Encounter: Payer: Self-pay | Admitting: Adult Health

## 2016-08-17 ENCOUNTER — Non-Acute Institutional Stay (SKILLED_NURSING_FACILITY): Payer: Medicare Other | Admitting: Adult Health

## 2016-08-17 DIAGNOSIS — D496 Neoplasm of unspecified behavior of brain: Secondary | ICD-10-CM

## 2016-08-17 DIAGNOSIS — M25511 Pain in right shoulder: Secondary | ICD-10-CM

## 2016-08-17 DIAGNOSIS — F0150 Vascular dementia without behavioral disturbance: Secondary | ICD-10-CM

## 2016-08-17 DIAGNOSIS — F411 Generalized anxiety disorder: Secondary | ICD-10-CM | POA: Diagnosis not present

## 2016-08-17 DIAGNOSIS — E210 Primary hyperparathyroidism: Secondary | ICD-10-CM

## 2016-08-17 DIAGNOSIS — R531 Weakness: Secondary | ICD-10-CM | POA: Diagnosis not present

## 2016-08-17 NOTE — Progress Notes (Signed)
Location:   [place] Room Number: 129 B Place of Service:  SNF (31)   CODE STATUS: Full Code  Allergies  Allergen Reactions  . Cephalexin Nausea And Vomiting    Chief Complaint  Patient presents with  . Acute Visit    ED Follow up    HPI:  He had been taken to the ED as his needs could no longer be met in his home environment. His wife is his primary caregiver and cannot meet his needs. He is here for short term rehab. He will need long term placement; more than likely in an assisted living environment. He is unable to fully participate in the hpi or ros; but did tell me that he feels good. There are no nursing concerns at this time.    Past Medical History:  Diagnosis Date  . Brain tumor ([place])   . Chronic constipation   . Headache(784.0)   . Hypercalcemia   . Hypercholesteremia   . Hypertension   . Insomnia   . Irregular heartbeat     Past Surgical History:  Procedure Laterality Date  . BRAIN SURGERY     x3  . SHOULDER SURGERY     right    Social History   Social History  . Marital status: Married    Spouse name: Apolonio Schneiders  . Number of children: 2  . Years of education: 2nd   Occupational History  . Retired    Social History Main Topics  . Smoking status: Former Smoker    Quit date: 09/19/1976  . Smokeless tobacco: Never Used  . Alcohol use No  . Drug use: No  . Sexual activity: Not on file   Other Topics Concern  . Not on file   Social History Narrative   Patient lives at home with his wife Apolonio Schneiders). Patient has two children. Patient is retired. One cup coffee sometimes may every other day. Right handed.   Family History  Problem Relation Age of Onset  . Appendicitis Father   . AAA (abdominal aortic aneurysm) Father       VITAL SIGNS BP 140/80   Pulse 75   Temp (!) 96.6 F (35.9 C)   Resp 20   Ht 5' 6"  (1.676 m)   Wt 141 lb (64 kg)   SpO2 99%   BMI 22.76 kg/m   Patient's Medications  New Prescriptions   No  medications on file  Previous Medications   HYDROCODONE-ACETAMINOPHEN (NORCO/VICODIN) 5-325 MG PER TABLET    Take 1 tablet by mouth every 8 (eight) hours as needed for moderate pain.   LISINOPRIL-HYDROCHLOROTHIAZIDE (PRINZIDE,ZESTORETIC) 20-25 MG PER TABLET    Take 0.5 tablets by mouth daily.    LORAZEPAM (ATIVAN) 1 MG TABLET    Take 1 mg by mouth at bedtime as needed for sleep.   Modified Medications   No medications on file  Discontinued Medications   No medications on file     SIGNIFICANT DIAGNOSTIC EXAMS   LABS REVIEWED:   08-16-16: wbc 8.6; hgb 12.9; hct 37.9; mcv 96.6; plt 240; glucose 153; bun 15.7; creat 1.03; k+ 4.1; na++ 139; tsh 1.02    Review of Systems  Unable to perform ROS: Dementia (and is hard of hearing)    Physical Exam  Constitutional: No distress.  Frail   Eyes: Conjunctivae are normal.  Neck: Neck supple. No JVD present. No thyromegaly present.  Cardiovascular: Normal rate, regular rhythm and intact distal pulses.   Respiratory: Effort normal and breath sounds  normal. No respiratory distress. He has no wheezes.  GI: Soft. Bowel sounds are normal. He exhibits no distension. There is no tenderness.  Musculoskeletal: He exhibits no edema.  Able to move all extremities   Lymphadenopathy:    He has no cervical adenopathy.  Neurological: He is alert.  Skin: Skin is warm and dry. He is not diaphoretic.  Psychiatric: He has a normal mood and affect.     ASSESSMENT/ PLAN:  1. Hypertension: will continue lisinopril/hct 20/25 mg 1/2 tab daily   2. Anxiety: will continue ativan 1 mg nightly as needed  3. Right shoulder pain: has vicodin 5/325 mg tabs every 8 hours as needed for pain  4. Hyperparathyroidism: will monitor  5. Brain tumor: is status post surgery X3; has headaches  6. Dementia: is currently not on medications will monitor     Time spent with patient 50   minutes >50% time spent counseling; reviewing medical record; tests; labs; and  developing future plan of care      MD is aware of resident's narcotic use and is in agreement with current plan of care. We will attempt to wean resident as apropriate   Ok Edwards NP [facility]  Contact [phone] Monday through Friday 8am- 5pm  After hours call [phone]

## 2016-08-18 ENCOUNTER — Encounter: Payer: Self-pay | Admitting: Internal Medicine

## 2016-08-18 ENCOUNTER — Non-Acute Institutional Stay (SKILLED_NURSING_FACILITY): Payer: Medicare Other | Admitting: Internal Medicine

## 2016-08-18 DIAGNOSIS — E210 Primary hyperparathyroidism: Secondary | ICD-10-CM

## 2016-08-18 DIAGNOSIS — R51 Headache: Secondary | ICD-10-CM | POA: Diagnosis not present

## 2016-08-18 DIAGNOSIS — I10 Essential (primary) hypertension: Secondary | ICD-10-CM

## 2016-08-18 DIAGNOSIS — F0150 Vascular dementia without behavioral disturbance: Secondary | ICD-10-CM

## 2016-08-18 DIAGNOSIS — R519 Headache, unspecified: Secondary | ICD-10-CM

## 2016-08-18 DIAGNOSIS — G8929 Other chronic pain: Secondary | ICD-10-CM

## 2016-08-18 DIAGNOSIS — R531 Weakness: Secondary | ICD-10-CM | POA: Diagnosis not present

## 2016-08-18 NOTE — Progress Notes (Signed)
Provider:  Veleta Miners Location:   [place] Room Number: 129/B Place of Service:  SNF (31)  PCP: Gara Kroner, MD Patient Care Team: Antony Contras, MD as PCP - General (Family Medicine)  Extended Emergency Contact Information Primary Emergency Contact: Ballinger,[name] Address: [address]          [place], [place] [address] [name] of [place] Phone: [phone] Work Phone: [phone] Mobile Phone: [phone] Relation: Spouse  Code Status: Full Code  Goals of Care: Advanced Directive information Advanced Directives 08/18/2016  Does Patient Have a Medical Advance Directive? Yes  Type of Advance Directive (No Data)  Does patient want to make changes to medical advance directive? No - Patient declined  Would patient like information on creating a medical advance directive? No - Patient declined      Chief Complaint  Patient presents with  . New Admit To SNF    HPI: Patient is a 83 y.o. male seen today for admission to SNF for therapy and Long term placement.  Patient has h/o Chronic headaches and previous Brain surgery for Benign tumors, Hypertension, Hypercalcemia, And Dementia  He was brought to the EMS with his wife as EMS thought that the condition in their house were not safe. He and his wife both were placed in this facility for further Therapy and Long term placement. Patient has Moderate dementia. Does not know where he is or what year is this. He doesn't remember anything about his old apartment. He says he like this place.  Only C/O Pain in the back of his head which seems is chronic. Says he did have surgery but doesn't remember for what.    Past Medical History:  Diagnosis Date  . Brain tumor ([place])   . Chronic constipation   . Headache(784.0)   . Hypercalcemia   . Hypercholesteremia   . Hypertension   . Insomnia   . Irregular heartbeat    Past Surgical History:  Procedure Laterality Date  . BRAIN SURGERY       x3  . SHOULDER SURGERY     right    reports that he quit smoking about 39 years ago. He has never used smokeless tobacco. He reports that he does not drink alcohol or use drugs. Social History   Social History  . Marital status: Married    Spouse name: Apolonio Schneiders  . Number of children: 2  . Years of education: 2nd   Occupational History  . Retired    Social History Main Topics  . Smoking status: Former Smoker    Quit date: 09/19/1976  . Smokeless tobacco: Never Used  . Alcohol use No  . Drug use: No  . Sexual activity: Not on file   Other Topics Concern  . Not on file   Social History Narrative   Patient lives at home with his wife Apolonio Schneiders). Patient has two children. Patient is retired. One cup coffee sometimes may every other day. Right handed.    Functional Status Survey:    Family History  Problem Relation Age of Onset  . Appendicitis Father   . AAA (abdominal aortic aneurysm) Father     Health Maintenance  Topic Date Due  . PNA vac Low Risk Adult (1 of 2 - PCV13) 08/17/2017 (Originally 12/27/1995)  . INFLUENZA VACCINE  12/01/2016  . TETANUS/TDAP  09/04/2021    Allergies  Allergen Reactions  . Cephalexin Nausea And Vomiting    Allergies as of 08/18/2016      Reactions  Cephalexin Nausea And Vomiting      Medication List       Accurate as of 08/18/16 10:08 AM. Always use your most recent med list.          HYDROcodone-acetaminophen 5-325 MG tablet Commonly known as:  NORCO/VICODIN Take 1 tablet by mouth every 8 (eight) hours as needed for moderate pain.   lisinopril-hydrochlorothiazide 20-25 MG tablet Commonly known as:  PRINZIDE,ZESTORETIC Take 0.5 tablets by mouth daily.   LORazepam 1 MG tablet Commonly known as:  ATIVAN Take 1 mg by mouth at bedtime as needed for sleep.       Review of Systems  Reason unable to perform ROS: Not reliable due to dementia.    Review of Systems  Constitutional: Negative for activity change, appetite  change, chills, diaphoresis, fatigue and fever.  HENT: Negative for mouth sores, postnasal drip, rhinorrhea, sinus pain and sore throat.   Respiratory: Negative for apnea, cough, chest tightness, shortness of breath and wheezing.   Cardiovascular: Negative for chest pain, palpitations and leg swelling.  Gastrointestinal: Negative for abdominal distention, abdominal pain, constipation, diarrhea, nausea and vomiting.  Genitourinary: Negative for dysuria and frequency.  Musculoskeletal: Negative for arthralgias, joint swelling and myalgias.  Skin: Negative for rash.  Neurological: Negative for dizziness, syncope, weakness, light-headedness and numbness.  Psychiatric/Behavioral: Negative for behavioral problems, confusion and sleep disturbance.     Vitals:   08/18/16 0958  BP: 120/60  Pulse: 86  Resp: 20  Temp: 97.1 F (36.2 C)  TempSrc: Oral   There is no height or weight on file to calculate BMI. Physical Exam  Constitutional: He appears well-developed and well-nourished.  HENT:  Head: Normocephalic.  Mouth/Throat: Oropharynx is clear and moist.  Eyes: Pupils are equal, round, and reactive to light.  Neck: Neck supple.  Cardiovascular: Normal rate, regular rhythm and normal heart sounds.   No murmur heard. Pulmonary/Chest: Effort normal and breath sounds normal. No respiratory distress. He has no wheezes. He has no rales.  Abdominal: Soft. Bowel sounds are normal. He exhibits no distension. There is no tenderness. There is no rebound.  Musculoskeletal: He exhibits no edema.  Neurological: He is alert.  Not oriented in time place or person. Says he cannot remember anything. No Focal deficit. Some weakness in LE Cannot raise his Right UE due to Right shoulder pain.  Skin: Skin is warm and dry. No rash noted. No erythema.  Psychiatric: He has a normal mood and affect. His behavior is normal. Thought content normal.    Labs reviewed: Basic Metabolic Panel:  Recent Labs   06/08/16 0202 08/16/16  NA 138 139  K 3.9 4.1  CL 104  --   CO2 23  --   GLUCOSE 117*  --   BUN 32* 16  CREATININE 1.59* 1.0  CALCIUM 11.9*  --    Liver Function Tests:  Recent Labs  06/08/16 0202  AST 18  ALT 13*  ALKPHOS 79  BILITOT 0.9  PROT 8.1  ALBUMIN 4.8   No results for input(s): LIPASE, AMYLASE in the last 8760 hours. No results for input(s): AMMONIA in the last 8760 hours. CBC:  Recent Labs  06/08/16 0202 08/16/16  WBC 5.0 8.6  NEUTROABS 3.3 7  HGB 12.1* 12.9*  HCT 35.1* 38*  MCV 92.6  --   PLT 184 240   Cardiac Enzymes: No results for input(s): CKTOTAL, CKMB, CKMBINDEX, TROPONINI in the last 8760 hours. BNP: Invalid input(s): POCBNP No results found for: HGBA1C Lab Results  Component Value Date   TSH 1.02 08/16/2016   Lab Results  Component Value Date   VITAMINB12 484 10/03/2012   Lab Results  Component Value Date   FOLATE 7.4 10/03/2012   Lab Results  Component Value Date   IRON 16 (L) 08/18/2008   TIBC 184 (L) 08/18/2008   FERRITIN 2018 Result confirmed by automatic dilution. (H) 08/18/2008    Imaging and Procedures obtained prior to SNF admission: No results found.  Assessment/Plan  Essential hypertension BP stable. Continue on Lisinopril and HCTZ Stable in facility.  Primary  Hyperthyroidism Has Hypercalcemia. Will just monitor for now.   Dementia without behavioral disturbance  Patient does have significant Dementia. Will start him on Aricept His CT Scan in 2016 showed diffuse atrophy and small Vessel Ischemic Changes.   Chronic  headache,  On reviewing his chart patient has had Headache for many years has had work up in past. At this time does not need any more work up.   Since Patient was not in good care for last few years will also get Vit D level and repeat Vit B12 and folate level.Especially with his Dementia.      Family/ staff Communication:   Labs/tests ordered:

## 2016-08-19 DIAGNOSIS — R531 Weakness: Secondary | ICD-10-CM | POA: Diagnosis not present

## 2016-08-20 DIAGNOSIS — R531 Weakness: Secondary | ICD-10-CM | POA: Diagnosis not present

## 2016-08-23 DIAGNOSIS — R531 Weakness: Secondary | ICD-10-CM | POA: Diagnosis not present

## 2016-08-24 DIAGNOSIS — R531 Weakness: Secondary | ICD-10-CM | POA: Diagnosis not present

## 2016-08-25 DIAGNOSIS — R531 Weakness: Secondary | ICD-10-CM | POA: Diagnosis not present

## 2016-08-26 DIAGNOSIS — R531 Weakness: Secondary | ICD-10-CM | POA: Diagnosis not present

## 2016-08-27 DIAGNOSIS — R531 Weakness: Secondary | ICD-10-CM | POA: Diagnosis not present

## 2016-08-30 DIAGNOSIS — R531 Weakness: Secondary | ICD-10-CM | POA: Diagnosis not present

## 2016-08-31 DIAGNOSIS — R531 Weakness: Secondary | ICD-10-CM | POA: Diagnosis not present

## 2016-09-01 DIAGNOSIS — R531 Weakness: Secondary | ICD-10-CM | POA: Diagnosis not present

## 2016-09-02 DIAGNOSIS — R531 Weakness: Secondary | ICD-10-CM | POA: Diagnosis not present

## 2016-09-03 DIAGNOSIS — R531 Weakness: Secondary | ICD-10-CM | POA: Diagnosis not present

## 2016-09-06 DIAGNOSIS — R531 Weakness: Secondary | ICD-10-CM | POA: Diagnosis not present

## 2016-09-07 DIAGNOSIS — R531 Weakness: Secondary | ICD-10-CM | POA: Diagnosis not present

## 2016-09-08 DIAGNOSIS — R531 Weakness: Secondary | ICD-10-CM | POA: Diagnosis not present

## 2016-09-09 DIAGNOSIS — R531 Weakness: Secondary | ICD-10-CM | POA: Diagnosis not present

## 2016-09-10 DIAGNOSIS — R531 Weakness: Secondary | ICD-10-CM | POA: Diagnosis not present

## 2016-09-13 DIAGNOSIS — R531 Weakness: Secondary | ICD-10-CM | POA: Diagnosis not present

## 2016-09-14 DIAGNOSIS — R531 Weakness: Secondary | ICD-10-CM | POA: Diagnosis not present

## 2016-09-15 DIAGNOSIS — R531 Weakness: Secondary | ICD-10-CM | POA: Diagnosis not present

## 2016-09-16 DIAGNOSIS — R531 Weakness: Secondary | ICD-10-CM | POA: Diagnosis not present

## 2016-09-17 DIAGNOSIS — R531 Weakness: Secondary | ICD-10-CM | POA: Diagnosis not present

## 2016-09-20 ENCOUNTER — Encounter: Payer: Self-pay | Admitting: Internal Medicine

## 2016-09-20 ENCOUNTER — Non-Acute Institutional Stay (SKILLED_NURSING_FACILITY): Payer: Medicare Other | Admitting: Internal Medicine

## 2016-09-20 DIAGNOSIS — I10 Essential (primary) hypertension: Secondary | ICD-10-CM

## 2016-09-20 DIAGNOSIS — R531 Weakness: Secondary | ICD-10-CM | POA: Diagnosis not present

## 2016-09-20 DIAGNOSIS — R51 Headache: Secondary | ICD-10-CM

## 2016-09-20 DIAGNOSIS — F0150 Vascular dementia without behavioral disturbance: Secondary | ICD-10-CM | POA: Diagnosis not present

## 2016-09-20 DIAGNOSIS — Z87898 Personal history of other specified conditions: Secondary | ICD-10-CM

## 2016-09-20 DIAGNOSIS — M25511 Pain in right shoulder: Secondary | ICD-10-CM

## 2016-09-20 DIAGNOSIS — E210 Primary hyperparathyroidism: Secondary | ICD-10-CM | POA: Diagnosis not present

## 2016-09-20 DIAGNOSIS — F411 Generalized anxiety disorder: Secondary | ICD-10-CM

## 2016-09-20 DIAGNOSIS — G8929 Other chronic pain: Secondary | ICD-10-CM

## 2016-09-20 NOTE — Progress Notes (Signed)
Patient ID: Richard Mack, male   DOB: 12-11-1928, 83 y.o.   MRN: [id number]    DATE: 09/20/2016  Location:    [place] Room Number: 144 B Place of Service: SNF (31)   Extended Emergency Contact Information Primary Emergency Contact: Sadek,[name] Address: [address]          [place], [place] [address] [name] of [place] Phone: [phone] Work Phone: [phone] Mobile Phone: [phone] Relation: Spouse  Advanced Directive information Does Patient Have a Medical Advance Directive?: Yes, Type of Advance Directive: Out of facility DNR (pink MOST or yellow form) (Attempt CPR), Pre-existing out of facility DNR order (yellow form or pink MOST form): Pink MOST form placed in chart (order not valid for inpatient use), Does patient want to make changes to medical advance directive?: No - Patient declined  Chief Complaint  Patient presents with  . Medical Management of Chronic Issues    Routine Visit    HPI:  83 yo male long term resident seen today for f/u. He has no concerns today. No nursing issues. No falls. He is a poor historian due to dementia. Hx obtained from chart.  Hypertension - BP stable on lisinopril/hct 20/25 mg 1/2 tab daily   Anxiety - stable on ativan 1 mg nightly as needed  Right shoulder pain - pain stable on prn vicodin 5/325   Primary Hyperparathyroidism/hypercalcemia - Ca2+ 12.3. No recent PTH  Hx Brain tumor - s/p surgery X3. He has chronic HA with neg neurologic w/u  Dementia - likely vascular. stable on aricept. Weight is 144 lbs (up 3 lbs). Albumin nml. CT head in 2016 showed chronic diffuse atrophy with chronic b/l periventricular white matter small vessel ischemic changes.  Anemia hx - likely due to chronic disease. Hgb 12.9   Past Medical History:  Diagnosis Date  . Brain tumor ([place])   . Chronic constipation   . Headache(784.0)   . Hypercalcemia   . Hypercholesteremia   . Hypertension   . Insomnia   . Irregular  heartbeat     Past Surgical History:  Procedure Laterality Date  . BRAIN SURGERY     x3  . SHOULDER SURGERY     right    Patient Care Team: Antony Contras, MD as PCP - General (Family Medicine)  Social History   Social History  . Marital status: Married    Spouse name: Apolonio Schneiders  . Number of children: 2  . Years of education: 2nd   Occupational History  . Retired    Social History Main Topics  . Smoking status: Former Smoker    Quit date: 09/19/1976  . Smokeless tobacco: Never Used  . Alcohol use No  . Drug use: No  . Sexual activity: Not on file   Other Topics Concern  . Not on file   Social History Narrative   Patient lives at home with his wife Apolonio Schneiders). Patient has two children. Patient is retired. One cup coffee sometimes may every other day. Right handed.     reports that he quit smoking about 40 years ago. He has never used smokeless tobacco. He reports that he does not drink alcohol or use drugs.  Family History  Problem Relation Age of Onset  . Appendicitis Father   . AAA (abdominal aortic aneurysm) Father    Family Status  Relation Status  . Father Deceased at age 7's  . Mother Deceased at age 62  . Sister Alive    Immunization History  Administered Date(s)  Administered  . Influenza-Unspecified 02/05/2010, 02/18/2012, 03/12/2013, 02/14/2014  . PPD Test 08/17/2016  . Td 09/05/2011    Allergies  Allergen Reactions  . Cephalexin Nausea And Vomiting    Medications: Patient's Medications  New Prescriptions   No medications on file  Previous Medications   DONEPEZIL (ARICEPT) 5 MG TABLET    Take 5 mg by mouth at bedtime.   HYDROCODONE-ACETAMINOPHEN (NORCO/VICODIN) 5-325 MG PER TABLET    Take 1 tablet by mouth every 8 (eight) hours as needed for moderate pain.   LISINOPRIL-HYDROCHLOROTHIAZIDE (PRINZIDE,ZESTORETIC) 20-25 MG PER TABLET    Take 0.5 tablets by mouth daily.    LORAZEPAM (ATIVAN) 1 MG TABLET    Take 1 mg by mouth at bedtime as needed  for sleep.   Modified Medications   No medications on file  Discontinued Medications   No medications on file    Review of Systems  Unable to perform ROS: Dementia    Vitals:   09/20/16 1145  BP: 118/78  Pulse: 78  Resp: 18  Temp: 98 F (36.7 C)  TempSrc: Oral  Weight: 144 lb 3.2 oz (65.4 kg)  Height: 5\' 6"  (1.676 m)   Body mass index is 23.27 kg/m.  Physical Exam  Constitutional: He appears well-developed.  Frail, thin lying in bed in NAD. Covers pulled over head  HENT:  Mouth/Throat: Oropharynx is clear and moist.  Eyes: Pupils are equal, round, and reactive to light. No scleral icterus.  Neck: Neck supple. Carotid bruit is not present. No thyromegaly present.  Cardiovascular: Normal rate, regular rhythm and intact distal pulses.  Exam reveals no gallop and no friction rub.   Murmur (1/6 SEM) heard. no distal LE swelling. No calf TTP  Pulmonary/Chest: Effort normal and breath sounds normal. He has no wheezes. He has no rales. He exhibits no tenderness.  Abdominal: Soft. Bowel sounds are normal. He exhibits no distension, no abdominal bruit, no pulsatile midline mass and no mass. There is no hepatomegaly. There is no tenderness. There is no rebound and no guarding.  Musculoskeletal: He exhibits edema and tenderness (occipital area).  Lymphadenopathy:    He has no cervical adenopathy.  Neurological: He is alert.  Skin: Skin is warm and dry. No rash noted.  Psychiatric: He has a normal mood and affect. His behavior is normal.     Labs reviewed: Nursing Home on 08/17/2016  Component Date Value Ref Range Status  . Hemoglobin 08/16/2016 12.9* 13.5 - 17.5 g/dL Final  . HCT 08/16/2016 38* 41 - 53 % Final  . Neutrophils Absolute 08/16/2016 7  /L Final  . Platelets 08/16/2016 240  150 - 399 K/L Final  . WBC 08/16/2016 8.6  10^3/mL Final  . Glucose 08/16/2016 153  mg/dL Final  . BUN 08/16/2016 16  4 - 21 mg/dL Final  . Creatinine 08/16/2016 1.0  0.6 - 1.3 mg/dL Final    . Potassium 08/16/2016 4.1  3.4 - 5.3 mmol/L Final  . Sodium 08/16/2016 139  137 - 147 mmol/L Final  . TSH 08/16/2016 1.02  0.41 - 5.90 uIU/mL Final    No results found.   Assessment/Plan   ICD-9-CM ICD-10-CM   1. PRIMARY HYPERPARATHYROIDISM 252.01 E21.0   2. Vascular dementia without behavioral disturbance 290.40 F01.50   3. Essential hypertension 401.9 I10   4. Anxiety state 300.00 F41.1   5. Pain in joint of right shoulder 719.41 M25.511    with hx surgery on shoulder  6. Chronic nonintractable headache, unspecified headache type 784.0  R51   7. History of brain tumor V12.49 Z87.898    s/p surgical resection x 3    Check intact PTH. Last Ca2+ 12.3  Cont current meds as ordered  PT/OT/ST as indicated  Will follow  Kelechi Astarita S. Perlie Gold  [facility] [address] [place], [place] [address] [phone] Cell (Monday-Friday 8 AM - 5 PM) [phone] After 5 PM and follow prompts

## 2016-09-21 DIAGNOSIS — R531 Weakness: Secondary | ICD-10-CM | POA: Diagnosis not present

## 2016-09-22 DIAGNOSIS — R531 Weakness: Secondary | ICD-10-CM | POA: Diagnosis not present

## 2016-09-23 DIAGNOSIS — R531 Weakness: Secondary | ICD-10-CM | POA: Diagnosis not present

## 2016-09-24 DIAGNOSIS — R531 Weakness: Secondary | ICD-10-CM | POA: Diagnosis not present

## 2016-09-29 DIAGNOSIS — R531 Weakness: Secondary | ICD-10-CM | POA: Diagnosis not present

## 2016-09-30 DIAGNOSIS — R531 Weakness: Secondary | ICD-10-CM | POA: Diagnosis not present

## 2016-10-29 ENCOUNTER — Encounter: Payer: Self-pay | Admitting: Adult Health

## 2016-10-29 ENCOUNTER — Non-Acute Institutional Stay (SKILLED_NURSING_FACILITY): Payer: Medicare Other | Admitting: Adult Health

## 2016-10-29 DIAGNOSIS — F0150 Vascular dementia without behavioral disturbance: Secondary | ICD-10-CM

## 2016-10-29 DIAGNOSIS — R51 Headache: Secondary | ICD-10-CM | POA: Diagnosis not present

## 2016-10-29 DIAGNOSIS — M25511 Pain in right shoulder: Secondary | ICD-10-CM

## 2016-10-29 DIAGNOSIS — I10 Essential (primary) hypertension: Secondary | ICD-10-CM

## 2016-10-29 DIAGNOSIS — E210 Primary hyperparathyroidism: Secondary | ICD-10-CM

## 2016-10-29 DIAGNOSIS — D496 Neoplasm of unspecified behavior of brain: Secondary | ICD-10-CM

## 2016-10-29 DIAGNOSIS — G8929 Other chronic pain: Secondary | ICD-10-CM

## 2016-10-29 NOTE — Progress Notes (Signed)
Location:   [facility] Room Number: 426 B Place of Service:  SNF (31)   CODE STATUS: Full Code  Allergies  Allergen Reactions  . Cephalexin Nausea And Vomiting    Chief Complaint  Patient presents with  . Medical Management of Chronic Issues    1 month follow up    HPI:  He is a 83 year old long term resident of this facility being seen for the management of his chronic illnesses. He tells me that he is feeling good and has no complaints. There are no nursing concerns at this time.    Past Medical History:  Diagnosis Date  . Brain tumor ([place])   . Chronic constipation   . Headache(784.0)   . Hypercalcemia   . Hypercholesteremia   . Hypertension   . Insomnia   . Irregular heartbeat     Past Surgical History:  Procedure Laterality Date  . BRAIN SURGERY     x3  . SHOULDER SURGERY     right    Social History   Social History  . Marital status: Married    Spouse name: Apolonio Schneiders  . Number of children: 2  . Years of education: 2nd   Occupational History  . Retired    Social History Main Topics  . Smoking status: Former Smoker    Quit date: 09/19/1976  . Smokeless tobacco: Never Used  . Alcohol use No  . Drug use: No  . Sexual activity: Not on file   Other Topics Concern  . Not on file   Social History Narrative   Patient lives at home with his wife Apolonio Schneiders). Patient has two children. Patient is retired. One cup coffee sometimes may every other day. Right handed.   Family History  Problem Relation Age of Onset  . Appendicitis Father   . AAA (abdominal aortic aneurysm) Father       VITAL SIGNS BP 120/74   Pulse 74   Temp 97.8 F (36.6 C)   Resp 18   Ht 5\' 6"  (1.676 m)   Wt 147 lb 6.4 oz (66.9 kg)   BMI 23.79 kg/m   Patient's Medications  New Prescriptions   No medications on file  Previous Medications   DONEPEZIL (ARICEPT) 10 MG TABLET    Take 10 mg by mouth at bedtime.    HYDROCODONE-ACETAMINOPHEN (NORCO/VICODIN) 5-325 MG  PER TABLET    Take 1 tablet by mouth every 8 (eight) hours as needed for moderate pain.   LISINOPRIL-HYDROCHLOROTHIAZIDE (PRINZIDE,ZESTORETIC) 20-25 MG PER TABLET    Take 0.5 tablets by mouth daily.   Modified Medications   No medications on file  Discontinued Medications   LORAZEPAM (ATIVAN) 1 MG TABLET    Take 1 mg by mouth at bedtime as needed for sleep.      SIGNIFICANT DIAGNOSTIC EXAMS  LABS REVIEWED:   08-16-16: wbc 8.6; hgb 12.9; hct 37.9; mcv 96.6; plt 240; glucose 153; bun 15.7; creat 1.03; k+ 4.1; na++ 139; ca 12.3  tsh 1.02 09-21-16: PTH: 151.800    Review of Systems  Unable to perform ROS: Dementia (and is hard of hearing)    Physical Exam  Constitutional: No distress.  Frail   Eyes: Conjunctivae are normal.  Neck: Neck supple. No JVD present. No thyromegaly present.  Cardiovascular: Normal rate, regular rhythm and intact distal pulses.   Respiratory: Effort normal and breath sounds normal. No respiratory distress. He has no wheezes.  GI: Soft. Bowel sounds are normal. He exhibits no distension.  There is no tenderness.  Musculoskeletal: He exhibits no edema.  Able to move all extremities   Lymphadenopathy:    He has no cervical adenopathy.  Neurological: He is alert.  Skin: Skin is warm and dry. He is not diaphoretic.  Psychiatric: He has a normal mood and affect.     ASSESSMENT/ PLAN:  1. Hypertension: b/b120/74will continue lisinopril/hct 20/25 mg 1/2 tab daily   2. Anxiety: is currently off all medications will monitor his status   3. Right shoulder pain: has vicodin 5/325 mg tabs every 8 hours as needed for pain  4. Hyperparathyroidism: PTH 151.800will monitor  5. Brain tumor: is status post surgery X3; has headaches  6. Dementia: is currently not on medications will monitor  His current weight is 147 pounds       MD is aware of resident's narcotic use and is in agreement with current plan of care. We will attempt to wean resident as apropriate       Ok Edwards NP [facility]  Contact [phone] Monday through Friday 8am- 5pm  After hours call [phone]

## 2016-11-02 ENCOUNTER — Encounter: Payer: Self-pay | Admitting: Adult Health

## 2016-11-02 ENCOUNTER — Non-Acute Institutional Stay (SKILLED_NURSING_FACILITY): Payer: Medicare Other | Admitting: Adult Health

## 2016-11-02 DIAGNOSIS — D496 Neoplasm of unspecified behavior of brain: Secondary | ICD-10-CM

## 2016-11-02 DIAGNOSIS — R51 Headache: Secondary | ICD-10-CM

## 2016-11-02 DIAGNOSIS — G8929 Other chronic pain: Secondary | ICD-10-CM

## 2016-11-02 NOTE — Progress Notes (Signed)
Location:   [facility] Room Number: 253 B Place of Service:  SNF (31)   CODE STATUS: Full Code  Allergies  Allergen Reactions  . Cephalexin Nausea And Vomiting    Chief Complaint  Patient presents with  . Acute Visit    Headaches    HPI:  Staff reports that he is having a headache on a daily basis. He is taking tylenol 650 mg daily;which relieves his headache. He is telling me that his headaches are occasionally. He tells me that he is feeling pretty good.    Past Medical History:  Diagnosis Date  . Brain tumor ([place])   . Chronic constipation   . Headache(784.0)   . Hypercalcemia   . Hypercholesteremia   . Hypertension   . Insomnia   . Irregular heartbeat     Past Surgical History:  Procedure Laterality Date  . BRAIN SURGERY     x3  . SHOULDER SURGERY     right    Social History   Social History  . Marital status: Married    Spouse name: Richard Mack  . Number of children: 2  . Years of education: 2nd   Occupational History  . Retired    Social History Main Topics  . Smoking status: Former Smoker    Quit date: 09/19/1976  . Smokeless tobacco: Never Used  . Alcohol use No  . Drug use: No  . Sexual activity: Not on file   Other Topics Concern  . Not on file   Social History Narrative   Patient lives at home with his wife Richard Mack). Patient has two children. Patient is retired. One cup coffee sometimes may every other day. Right handed.   Family History  Problem Relation Age of Onset  . Appendicitis Father   . AAA (abdominal aortic aneurysm) Father       VITAL SIGNS BP 119/64   Pulse 74   Temp 98.2 F (36.8 C)   Resp 18   Ht 5\' 6"  (1.676 m)   Wt 147 lb 6.4 oz (66.9 kg)   SpO2 96%   BMI 23.79 kg/m   Patient's Medications  New Prescriptions   No medications on file  Previous Medications   DONEPEZIL (ARICEPT) 10 MG TABLET    Take 10 mg by mouth at bedtime.    HYDROCODONE-ACETAMINOPHEN (NORCO/VICODIN) 5-325 MG PER TABLET     Take 1 tablet by mouth every 8 (eight) hours as needed for moderate pain.   LISINOPRIL-HYDROCHLOROTHIAZIDE (PRINZIDE,ZESTORETIC) 20-25 MG PER TABLET    Take 0.5 tablets by mouth daily.   Modified Medications   No medications on file  Discontinued Medications   No medications on file     SIGNIFICANT DIAGNOSTIC EXAMS  LABS REVIEWED:   08-16-16: wbc 8.6; hgb 12.9; hct 37.9; mcv 96.6; plt 240; glucose 153; bun 15.7; creat 1.03; k+ 4.1; na++ 139; ca 12.3  tsh 1.02 09-21-16: PTH: 151.800    Review of Systems  Unable to perform ROS: Dementia (and is hard of hearing)    Physical Exam  Constitutional: No distress.  Frail   Eyes: Conjunctivae are normal.  Neck: Neck supple. No JVD present. No thyromegaly present.  Cardiovascular: Normal rate, regular rhythm and intact distal pulses.   Respiratory: Effort normal and breath sounds normal. No respiratory distress. He has no wheezes.  GI: Soft. Bowel sounds are normal. He exhibits no distension. There is no tenderness.  Musculoskeletal: He exhibits no edema.  Able to move all extremities   Lymphadenopathy:  He has no cervical adenopathy.  Neurological: He is alert.  Skin: Skin is warm and dry. He is not diaphoretic.  Psychiatric: He has a normal mood and affect.     ASSESSMENT/ PLAN:  1. Brain tumor: is status post surgery X3; has headaches will change his tylenol to 650 mg every AM and every 6 hours as needed     MD is aware of resident's narcotic use and is in agreement with current plan of care. We will attempt to wean resident as apropriate    Ok Edwards NP [facility]  Contact [phone] Monday through Friday 8am- 5pm  After hours call [phone]

## 2016-11-04 ENCOUNTER — Non-Acute Institutional Stay (SKILLED_NURSING_FACILITY): Payer: Medicare Other

## 2016-11-04 DIAGNOSIS — Z0000 Encounter for general adult medical examination without abnormal findings: Secondary | ICD-10-CM | POA: Diagnosis not present

## 2016-11-04 NOTE — Patient Instructions (Signed)
Richard Mack , Thank you for taking time to come for your Medicare Wellness Visit. I appreciate your ongoing commitment to your health goals. Please review the following plan we discussed and let me know if I can assist you in the future.   Screening recommendations/referrals: Colonoscopy up to date, pt over age 81 Recommended yearly ophthalmology/optometry visit for glaucoma screening and checkup Recommended yearly dental visit for hygiene and checkup  Vaccinations: Influenza vaccine up to date. Due 02/05/17 Pneumococcal vaccine 23 due Tdap vaccine up to date. Due 09/04/21 Shingles vaccine not in records  Advanced directives: Need copy for chart  Conditions/risks identified: None  Next appointment: Dr. Eulas Post makes rounds  Preventive Care 81 Years and Older, Male Preventive care refers to lifestyle choices and visits with your health care provider that can promote health and wellness. What does preventive care include?  A yearly physical exam. This is also called an annual well check.  Dental exams once or twice a year.  Routine eye exams. Ask your health care provider how often you should have your eyes checked.  Personal lifestyle choices, including:  Daily care of your teeth and gums.  Regular physical activity.  Eating a healthy diet.  Avoiding tobacco and drug use.  Limiting alcohol use.  Practicing safe sex.  Taking low doses of aspirin every day.  Taking vitamin and mineral supplements as recommended by your health care provider. What happens during an annual well check? The services and screenings done by your health care provider during your annual well check will depend on your age, overall health, lifestyle risk factors, and family history of disease. Counseling  Your health care provider may ask you questions about your:  Alcohol use.  Tobacco use.  Drug use.  Emotional well-being.  Home and relationship well-being.  Sexual activity.  Eating  habits.  History of falls.  Memory and ability to understand (cognition).  Work and work Statistician. Screening  You may have the following tests or measurements:  Height, weight, and BMI.  Blood pressure.  Lipid and cholesterol levels. These may be checked every 5 years, or more frequently if you are over 33 years old.  Skin check.  Lung cancer screening. You may have this screening every year starting at age 91 if you have a 30-pack-year history of smoking and currently smoke or have quit within the past 15 years.  Fecal occult blood test (FOBT) of the stool. You may have this test every year starting at age 11.  Flexible sigmoidoscopy or colonoscopy. You may have a sigmoidoscopy every 5 years or a colonoscopy every 10 years starting at age 81.  Prostate cancer screening. Recommendations will vary depending on your family history and other risks.  Hepatitis C blood test.  Hepatitis B blood test.  Sexually transmitted disease (STD) testing.  Diabetes screening. This is done by checking your blood sugar (glucose) after you have not eaten for a while (fasting). You may have this done every 1-3 years.  Abdominal aortic aneurysm (AAA) screening. You may need this if you are a current or former smoker.  Osteoporosis. You may be screened starting at age 79 if you are at high risk. Talk with your health care provider about your test results, treatment options, and if necessary, the need for more tests. Vaccines  Your health care provider may recommend certain vaccines, such as:  Influenza vaccine. This is recommended every year.  Tetanus, diphtheria, and acellular pertussis (Tdap, Td) vaccine. You may need a Td booster  every 10 years.  Zoster vaccine. You may need this after age 47.  Pneumococcal 13-valent conjugate (PCV13) vaccine. One dose is recommended after age 67.  Pneumococcal polysaccharide (PPSV23) vaccine. One dose is recommended after age 79. Talk to your health  care provider about which screenings and vaccines you need and how often you need them. This information is not intended to replace advice given to you by your health care provider. Make sure you discuss any questions you have with your health care provider. Document Released: 05/16/2015 Document Revised: 01/07/2016 Document Reviewed: 02/18/2015 Elsevier Interactive Patient Education  2017 [employer] Prevention in the Home Falls can cause injuries. They can happen to people of all ages. There are many things you can do to make your home safe and to help prevent falls. What can I do on the outside of my home?  Regularly fix the edges of walkways and driveways and fix any cracks.  Remove anything that might make you trip as you walk through a door, such as a raised step or threshold.  Trim any bushes or trees on the path to your home.  Use bright outdoor lighting.  Clear any walking paths of anything that might make someone trip, such as rocks or tools.  Regularly check to see if handrails are loose or broken. Make sure that both sides of any steps have handrails.  Any raised decks and porches should have guardrails on the edges.  Have any leaves, snow, or ice cleared regularly.  Use sand or salt on walking paths during winter.  Clean up any spills in your garage right away. This includes oil or grease spills. What can I do in the bathroom?  Use night lights.  Install grab bars by the toilet and in the tub and shower. Do not use towel bars as grab bars.  Use non-skid mats or decals in the tub or shower.  If you need to sit down in the shower, use a plastic, non-slip stool.  Keep the floor dry. Clean up any water that spills on the floor as soon as it happens.  Remove soap buildup in the tub or shower regularly.  Attach bath mats securely with double-sided non-slip rug tape.  Do not have throw rugs and other things on the floor that can make you trip. What can I do  in the bedroom?  Use night lights.  Make sure that you have a light by your bed that is easy to reach.  Do not use any sheets or blankets that are too big for your bed. They should not hang down onto the floor.  Have a firm chair that has side arms. You can use this for support while you get dressed.  Do not have throw rugs and other things on the floor that can make you trip. What can I do in the kitchen?  Clean up any spills right away.  Avoid walking on wet floors.  Keep items that you use a lot in easy-to-reach places.  If you need to reach something above you, use a strong step stool that has a grab bar.  Keep electrical cords out of the way.  Do not use floor polish or wax that makes floors slippery. If you must use wax, use non-skid floor wax.  Do not have throw rugs and other things on the floor that can make you trip. What can I do with my stairs?  Do not leave any items on the stairs.  Make  sure that there are handrails on both sides of the stairs and use them. Fix handrails that are broken or loose. Make sure that handrails are as long as the stairways.  Check any carpeting to make sure that it is firmly attached to the stairs. Fix any carpet that is loose or worn.  Avoid having throw rugs at the top or bottom of the stairs. If you do have throw rugs, attach them to the floor with carpet tape.  Make sure that you have a light switch at the top of the stairs and the bottom of the stairs. If you do not have them, ask someone to add them for you. What else can I do to help prevent falls?  Wear shoes that:  Do not have high heels.  Have rubber bottoms.  Are comfortable and fit you well.  Are closed at the toe. Do not wear sandals.  If you use a stepladder:  Make sure that it is fully opened. Do not climb a closed stepladder.  Make sure that both sides of the stepladder are locked into place.  Ask someone to hold it for you, if possible.  Clearly mark  and make sure that you can see:  Any grab bars or handrails.  First and last steps.  Where the edge of each step is.  Use tools that help you move around (mobility aids) if they are needed. These include:  Canes.  Walkers.  Scooters.  Crutches.  Turn on the lights when you go into a dark area. Replace any light bulbs as soon as they burn out.  Set up your furniture so you have a clear path. Avoid moving your furniture around.  If any of your floors are uneven, fix them.  If there are any pets around you, be aware of where they are.  Review your medicines with your doctor. Some medicines can make you feel dizzy. This can increase your chance of falling. Ask your doctor what other things that you can do to help prevent falls. This information is not intended to replace advice given to you by your health care provider. Make sure you discuss any questions you have with your health care provider. Document Released: 02/13/2009 Document Revised: 09/25/2015 Document Reviewed: 05/24/2014 Elsevier Interactive Patient Education  2017 [employer].

## 2016-11-04 NOTE — Progress Notes (Signed)
Subjective:   Richard Mack is a 83 y.o. male who presents for Medicare Annual/Subsequent preventive examination at [facility]    Objective:    Vitals: BP 138/67 (BP Location: Right Arm, Patient Position: Sitting)   Pulse 94   Temp (!) 97.4 F (36.3 C) (Oral)   Ht 5\' 6"  (1.676 m)   Wt 147 lb (66.7 kg)   SpO2 98%   BMI 23.73 kg/m   Body mass index is 23.73 kg/m.  Tobacco History  Smoking Status  . Former Smoker  . Years: 1.00  . Types: Cigarettes  . Quit date: 09/19/1976  Smokeless Tobacco  . Never Used     Counseling given: Not Answered   Past Medical History:  Diagnosis Date  . Brain tumor ([place])   . Chronic constipation   . Headache(784.0)   . Hypercalcemia   . Hypercholesteremia   . Hypertension   . Insomnia   . Irregular heartbeat    Past Surgical History:  Procedure Laterality Date  . BRAIN SURGERY     x3  . SHOULDER SURGERY     right   Family History  Problem Relation Age of Onset  . Appendicitis Father   . AAA (abdominal aortic aneurysm) Father    History  Sexual Activity  . Sexual activity: Not on file    Outpatient Encounter Prescriptions as of 11/04/2016  Medication Sig  . donepezil (ARICEPT) 10 MG tablet Take 10 mg by mouth at bedtime.   Marland Kitchen HYDROcodone-acetaminophen (NORCO/VICODIN) 5-325 MG per tablet Take 1 tablet by mouth every 8 (eight) hours as needed for moderate pain.  Marland Kitchen lisinopril-hydrochlorothiazide (PRINZIDE,ZESTORETIC) 20-25 MG per tablet Take 0.5 tablets by mouth daily.    No facility-administered encounter medications on file as of 11/04/2016.     Activities of Daily Living In your present state of health, do you have any difficulty performing the following activities: 11/04/2016  Hearing? Y  Vision? N  Difficulty concentrating or making decisions? N  Walking or climbing stairs? Y  Dressing or bathing? Y  Doing errands, shopping? Y  Preparing Food and eating ? Y  Using the Toilet? N  In the past six months, have  you accidently leaked urine? N  Do you have problems with loss of bowel control? N  Managing your Medications? Y  Managing your Finances? Y  Housekeeping or managing your Housekeeping? Y  Some recent data might be hidden    Patient Care Team: Antony Contras, MD as PCP - General (Family Medicine)   Assessment:     Exercise Activities and Dietary recommendations Current Exercise Habits: Home exercise routine, Type of exercise: walking, Time (Minutes): 20, Frequency (Times/Week): 2, Weekly Exercise (Minutes/Week): 40, Intensity: Mild  Goals    . Maintain Lifestyle          Pt will maintain lifestyle.       Fall Risk Fall Risk  11/04/2016  Falls in the past year? No   Depression Screen PHQ 2/9 Scores 11/04/2016  PHQ - 2 Score 0    Cognitive Function     6CIT Screen 11/04/2016  What Year? 4 points  What month? 3 points  What time? 0 points  Count back from 20 4 points  Months in reverse 4 points  Repeat phrase 10 points  Total Score 25    Immunization History  Administered Date(s) Administered  . Influenza-Unspecified 02/05/2010, 02/18/2012, 03/12/2013, 02/14/2014  . PPD Test 08/17/2016  . Td 09/05/2011   Screening Tests Health Maintenance  Topic Date Due  . PNA vac Low Risk Adult (1 of 2 - PCV13) 08/17/2017 (Originally 12/27/1995)  . INFLUENZA VACCINE  12/01/2016  . TETANUS/TDAP  09/04/2021      Plan:  I have personally reviewed and addressed the Medicare Annual Wellness questionnaire and have noted the following in the patient's chart:  A. Medical and social history B. Use of alcohol, tobacco or illicit drugs  C. Current medications and supplements D. Functional ability and status E.  Nutritional status F.  Physical activity G. Advance directives H. List of other physicians I.  Hospitalizations, surgeries, and ER visits in previous 12 months J.  [place] to include hearing, vision, cognitive, depression L. Referrals and appointments -  none  In addition, I have reviewed and discussed with patient certain preventive protocols, quality metrics, and best practice recommendations. A written personalized care plan for preventive services as well as general preventive health recommendations were provided to patient.  See attached scanned questionnaire for additional information.   Signed,   Rich Reining, RN Nurse Health Advisor   Quick Notes   Health Maintenance: PNA 23 due.     Abnormal Screen: 6 Cit: 25     Patient Concerns: None     Nurse Concerns: None

## 2016-11-06 DIAGNOSIS — Z23 Encounter for immunization: Secondary | ICD-10-CM | POA: Diagnosis not present

## 2016-11-15 DIAGNOSIS — R51 Headache: Secondary | ICD-10-CM

## 2016-11-15 DIAGNOSIS — R519 Headache, unspecified: Secondary | ICD-10-CM | POA: Insufficient documentation

## 2016-11-30 ENCOUNTER — Encounter: Payer: Self-pay | Admitting: Adult Health

## 2016-11-30 ENCOUNTER — Non-Acute Institutional Stay (SKILLED_NURSING_FACILITY): Payer: Medicare Other | Admitting: Adult Health

## 2016-11-30 DIAGNOSIS — F411 Generalized anxiety disorder: Secondary | ICD-10-CM | POA: Diagnosis not present

## 2016-11-30 DIAGNOSIS — M25511 Pain in right shoulder: Secondary | ICD-10-CM

## 2016-11-30 DIAGNOSIS — I10 Essential (primary) hypertension: Secondary | ICD-10-CM | POA: Diagnosis not present

## 2016-11-30 NOTE — Progress Notes (Signed)
Location:   [place] Room Number: 009 B Place of Service:  SNF (31)   CODE STATUS: Full Code  Allergies  Allergen Reactions  . Cephalexin Nausea And Vomiting    Chief Complaint  Patient presents with  . Medical Management of Chronic Issues    1 month follow up    HPI:  He is an 83 year old long term resident of this facility being seen for the management of his chronic illnesses hypertension; anxiety; and chronic right shoulder pain. Overall there is little change in his status. He tells me that he is feeling good and has no concerns. He does spend most of his time in his room with his wife per her choice. There are no nursing concerns at this time.   Past Medical History:  Diagnosis Date  . Brain tumor ([place])   . Chronic constipation   . Headache(784.0)   . Hypercalcemia   . Hypercholesteremia   . Hypertension   . Insomnia   . Irregular heartbeat     Past Surgical History:  Procedure Laterality Date  . BRAIN SURGERY     x3  . SHOULDER SURGERY     right    Social History   Social History  . Marital status: Married    Spouse name: Apolonio Schneiders  . Number of children: 2  . Years of education: 2nd   Occupational History  . Retired    Social History Main Topics  . Smoking status: Former Smoker    Years: 1.00    Types: Cigarettes    Quit date: 09/19/1976  . Smokeless tobacco: Never Used  . Alcohol use No  . Drug use: No  . Sexual activity: Not on file   Other Topics Concern  . Not on file   Social History Narrative   Patient lives at home with his wife Apolonio Schneiders). Patient has two children. Patient is retired. One cup coffee sometimes may every other day. Right handed.   Family History  Problem Relation Age of Onset  . Appendicitis Father   . AAA (abdominal aortic aneurysm) Father       VITAL SIGNS BP 128/74   Pulse 68   Temp 98.4 F (36.9 C)   Resp 18   Ht 5\' 6"  (1.676 m)   Wt 148 lb (67.1 kg)   SpO2 96%   BMI 23.89 kg/m    Patient's Medications  New Prescriptions   No medications on file  Previous Medications   ACETAMINOPHEN (TYLENOL) 325 MG TABLET    Give one tablet by mouth in the morning and every 6 hours as needed for headache   DONEPEZIL (ARICEPT) 10 MG TABLET    Take 10 mg by mouth at bedtime.    HYDROCODONE-ACETAMINOPHEN (NORCO/VICODIN) 5-325 MG PER TABLET    Take 1 tablet by mouth every 8 (eight) hours as needed for moderate pain.   LISINOPRIL-HYDROCHLOROTHIAZIDE (PRINZIDE,ZESTORETIC) 20-25 MG PER TABLET    Take 0.5 tablets by mouth daily.   Modified Medications   No medications on file  Discontinued Medications   No medications on file     SIGNIFICANT DIAGNOSTIC EXAMS  NO NEW EXAMS   LABS REVIEWED: PREVIOUS   08-16-16: wbc 8.6; hgb 12.9; hct 37.9; mcv 96.6; plt 240; glucose 153; bun 15.7; creat 1.03; k+ 4.1; na++ 139; ca 12.3  tsh 1.02 09-21-16: PTH: 151.800  NO NEW LABS.    Review of Systems  Unable to perform ROS: Dementia (is also hard of hearing )  Physical Exam  Constitutional: No distress.  Frail   Eyes: Conjunctivae are normal.  Neck: Neck supple. No JVD present. No thyromegaly present.  Cardiovascular: Normal rate, regular rhythm and intact distal pulses.   Respiratory: Effort normal and breath sounds normal. No respiratory distress. He has no wheezes.  GI: Soft. Bowel sounds are normal. He exhibits no distension. There is no tenderness.  Musculoskeletal: He exhibits no edema.  Able to move all extremities   Lymphadenopathy:    He has no cervical adenopathy.  Neurological: He is alert.  Skin: Skin is warm and dry. He is not diaphoretic.  Psychiatric: He has a normal mood and affect.   ASSESSMENT/ PLAN:  TODAY:   1. Hypertension: is stable:  B/b128/74 will continue lisinopril/hct 20/25 mg 1/2 tab daily   2. Anxiety:is stable  is currently off all medications will monitor his status   3. Right shoulder pain: is stable has vicodin 5/325 mg tabs every 8 hours as  needed for pain  PREVIOUS   4. Hyperparathyroidism: unchanged:  PTH 151.800 will monitor  5. Brain tumor: unchanged:  is status post surgery X3; has headaches: will continue tylenol daily and every 6 hours as needed.   6. Dementia: without change: is on aricept 10 mg nightly  His weight is 148 pounds will monitor      MD is aware of resident's narcotic use and is in agreement with current plan of care. We will attempt to wean resident as apropriate     Ok Edwards NP [facility]  Contact [phone] Monday through Friday 8am- 5pm  After hours call [phone]

## 2016-12-21 DIAGNOSIS — R531 Weakness: Secondary | ICD-10-CM | POA: Diagnosis not present

## 2016-12-22 DIAGNOSIS — R531 Weakness: Secondary | ICD-10-CM | POA: Diagnosis not present

## 2016-12-23 DIAGNOSIS — R531 Weakness: Secondary | ICD-10-CM | POA: Diagnosis not present

## 2016-12-24 DIAGNOSIS — R531 Weakness: Secondary | ICD-10-CM | POA: Diagnosis not present

## 2016-12-27 DIAGNOSIS — R531 Weakness: Secondary | ICD-10-CM | POA: Diagnosis not present

## 2016-12-28 DIAGNOSIS — R531 Weakness: Secondary | ICD-10-CM | POA: Diagnosis not present

## 2016-12-29 DIAGNOSIS — R531 Weakness: Secondary | ICD-10-CM | POA: Diagnosis not present

## 2016-12-30 ENCOUNTER — Encounter: Payer: Self-pay | Admitting: Adult Health

## 2016-12-30 ENCOUNTER — Non-Acute Institutional Stay (SKILLED_NURSING_FACILITY): Payer: Medicare Other | Admitting: Adult Health

## 2016-12-30 DIAGNOSIS — D496 Neoplasm of unspecified behavior of brain: Secondary | ICD-10-CM

## 2016-12-30 DIAGNOSIS — F0150 Vascular dementia without behavioral disturbance: Secondary | ICD-10-CM

## 2016-12-30 DIAGNOSIS — E210 Primary hyperparathyroidism: Secondary | ICD-10-CM

## 2016-12-30 DIAGNOSIS — R531 Weakness: Secondary | ICD-10-CM | POA: Diagnosis not present

## 2016-12-30 NOTE — Progress Notes (Signed)
Location:   [place] Room Number: 637 B Place of Service:  SNF (31)   CODE STATUS: Full Code  Allergies  Allergen Reactions  . Cephalexin Nausea And Vomiting    Chief Complaint  Patient presents with  . Medical Management of Chronic Issues    hyperparathyroidism; brain tumor; dementia    HPI:  He is a 83 year old long term resident of this facility being seen for the management of his chronic illnesses: hyperparathyroidism; brain tumor; and dementia. He is unable to fully participate in the hpi or ros. He tells me that he is feeling. There are no reports of pain; no reports of behavioral issues; no changes in appetite. There are no nursing concerns at this time.    Past Medical History:  Diagnosis Date  . Brain tumor ([place])   . Chronic constipation   . Headache(784.0)   . Hypercalcemia   . Hypercholesteremia   . Hypertension   . Insomnia   . Irregular heartbeat     Past Surgical History:  Procedure Laterality Date  . BRAIN SURGERY     x3  . SHOULDER SURGERY     right    Social History   Social History  . Marital status: Married    Spouse name: Apolonio Schneiders  . Number of children: 2  . Years of education: 2nd   Occupational History  . Retired    Social History Main Topics  . Smoking status: Former Smoker    Years: 1.00    Types: Cigarettes    Quit date: 09/19/1976  . Smokeless tobacco: Never Used  . Alcohol use No  . Drug use: No  . Sexual activity: Not on file   Other Topics Concern  . Not on file   Social History Narrative   Patient lives at home with his wife Apolonio Schneiders). Patient has two children. Patient is retired. One cup coffee sometimes may every other day. Right handed.   Family History  Problem Relation Age of Onset  . Appendicitis Father   . AAA (abdominal aortic aneurysm) Father       VITAL SIGNS BP 130/78   Pulse 78   Temp 97.6 F (36.4 C)   Resp 18   Ht 5\' 6"  (1.676 m)   Wt 146 lb 9.6 oz (66.5 kg)   SpO2 96%   BMI  23.66 kg/m   Patient's Medications  New Prescriptions   No medications on file  Previous Medications   ACETAMINOPHEN (TYLENOL) 325 MG TABLET    Give two tablets by mouth in the morning and every 6 hours as needed for headache   DONEPEZIL (ARICEPT) 10 MG TABLET    Take 10 mg by mouth at bedtime.    HYDROCODONE-ACETAMINOPHEN (NORCO/VICODIN) 5-325 MG PER TABLET    Take 1 tablet by mouth every 8 (eight) hours as needed for moderate pain.   LISINOPRIL-HYDROCHLOROTHIAZIDE (PRINZIDE,ZESTORETIC) 20-25 MG PER TABLET    Take 0.5 tablets by mouth daily.   Modified Medications   No medications on file  Discontinued Medications   No medications on file     SIGNIFICANT DIAGNOSTIC EXAMS  NO NEW EXAMS   LABS REVIEWED: PREVIOUS   08-16-16: wbc 8.6; hgb 12.9; hct 37.9; mcv 96.6; plt 240; glucose 153; bun 15.7; creat 1.03; k+ 4.1; na++ 139; ca 12.3  tsh 1.02 09-21-16: PTH: 151.800  NO NEW LABS.    Review of Systems  Unable to perform ROS: Dementia (is confused; and is hard of hearing )  Physical Exam  Constitutional: No distress.  Frail   Eyes: Conjunctivae are normal.  Neck: Neck supple. No JVD present. No thyromegaly present.  Cardiovascular: Normal rate, regular rhythm and intact distal pulses.   Respiratory: Effort normal and breath sounds normal. No respiratory distress. He has no wheezes.  GI: Soft. Bowel sounds are normal. He exhibits no distension. There is no tenderness.  Musculoskeletal: He exhibits no edema.  Able to move all extremities   Lymphadenopathy:    He has no cervical adenopathy.  Neurological: He is alert.  Skin: Skin is warm and dry. He is not diaphoretic.  Psychiatric: He has a normal mood and affect.     ASSESSMENT/ PLAN:  TODAY:   1. Hyperparathyroidism: unchanged:  PTH 151.800 will monitor  2. Brain tumor: unchanged:  is status post surgery X3; has headaches: will continue tylenol daily and every 6 hours as needed.   3. Dementia: without change: is on  aricept 10 mg nightly  His weight is 146 pounds will monitor   PREVIOUS   4. Hypertension: is stable: b/b130/78 will continue lisinopril/hct 20/25 mg 1/2 tab daily   5. Anxiety:is stable  is currently off all medications will monitor his status   6. Right shoulder pain: is stable has vicodin 5/325 mg tabs every 8 hours as needed for pain  Will check cbc; cmp pth; tsh   MD is aware of resident's narcotic use and is in agreement with current plan of care. We will attempt to wean resident as apropriate     Ok Edwards NP [facility]  Contact [phone] Monday through Friday 8am- 5pm  After hours call [phone]

## 2016-12-31 DIAGNOSIS — R531 Weakness: Secondary | ICD-10-CM | POA: Diagnosis not present

## 2017-01-03 DIAGNOSIS — R531 Weakness: Secondary | ICD-10-CM | POA: Diagnosis not present

## 2017-01-03 DIAGNOSIS — M6281 Muscle weakness (generalized): Secondary | ICD-10-CM | POA: Diagnosis not present

## 2017-01-04 DIAGNOSIS — R531 Weakness: Secondary | ICD-10-CM | POA: Diagnosis not present

## 2017-01-04 DIAGNOSIS — M6281 Muscle weakness (generalized): Secondary | ICD-10-CM | POA: Diagnosis not present

## 2017-01-05 DIAGNOSIS — M6281 Muscle weakness (generalized): Secondary | ICD-10-CM | POA: Diagnosis not present

## 2017-01-05 DIAGNOSIS — R531 Weakness: Secondary | ICD-10-CM | POA: Diagnosis not present

## 2017-01-06 DIAGNOSIS — R531 Weakness: Secondary | ICD-10-CM | POA: Diagnosis not present

## 2017-01-06 DIAGNOSIS — M6281 Muscle weakness (generalized): Secondary | ICD-10-CM | POA: Diagnosis not present

## 2017-01-07 DIAGNOSIS — R531 Weakness: Secondary | ICD-10-CM | POA: Diagnosis not present

## 2017-01-07 DIAGNOSIS — M6281 Muscle weakness (generalized): Secondary | ICD-10-CM | POA: Diagnosis not present

## 2017-01-10 DIAGNOSIS — R531 Weakness: Secondary | ICD-10-CM | POA: Diagnosis not present

## 2017-01-10 DIAGNOSIS — M6281 Muscle weakness (generalized): Secondary | ICD-10-CM | POA: Diagnosis not present

## 2017-01-11 DIAGNOSIS — R531 Weakness: Secondary | ICD-10-CM | POA: Diagnosis not present

## 2017-01-11 DIAGNOSIS — M6281 Muscle weakness (generalized): Secondary | ICD-10-CM | POA: Diagnosis not present

## 2017-01-12 DIAGNOSIS — M6281 Muscle weakness (generalized): Secondary | ICD-10-CM | POA: Diagnosis not present

## 2017-01-12 DIAGNOSIS — R531 Weakness: Secondary | ICD-10-CM | POA: Diagnosis not present

## 2017-01-13 DIAGNOSIS — M6281 Muscle weakness (generalized): Secondary | ICD-10-CM | POA: Diagnosis not present

## 2017-01-13 DIAGNOSIS — R531 Weakness: Secondary | ICD-10-CM | POA: Diagnosis not present

## 2017-01-14 DIAGNOSIS — M6281 Muscle weakness (generalized): Secondary | ICD-10-CM | POA: Diagnosis not present

## 2017-01-14 DIAGNOSIS — R531 Weakness: Secondary | ICD-10-CM | POA: Diagnosis not present

## 2017-01-17 DIAGNOSIS — R531 Weakness: Secondary | ICD-10-CM | POA: Diagnosis not present

## 2017-01-17 DIAGNOSIS — M6281 Muscle weakness (generalized): Secondary | ICD-10-CM | POA: Diagnosis not present

## 2017-01-18 DIAGNOSIS — M6281 Muscle weakness (generalized): Secondary | ICD-10-CM | POA: Diagnosis not present

## 2017-01-18 DIAGNOSIS — R531 Weakness: Secondary | ICD-10-CM | POA: Diagnosis not present

## 2017-01-20 DIAGNOSIS — R531 Weakness: Secondary | ICD-10-CM | POA: Diagnosis not present

## 2017-01-20 DIAGNOSIS — M6281 Muscle weakness (generalized): Secondary | ICD-10-CM | POA: Diagnosis not present

## 2017-01-28 ENCOUNTER — Non-Acute Institutional Stay (SKILLED_NURSING_FACILITY): Payer: Medicare Other | Admitting: Adult Health

## 2017-01-28 ENCOUNTER — Encounter: Payer: Self-pay | Admitting: Adult Health

## 2017-01-28 DIAGNOSIS — I10 Essential (primary) hypertension: Secondary | ICD-10-CM

## 2017-01-28 DIAGNOSIS — F411 Generalized anxiety disorder: Secondary | ICD-10-CM | POA: Diagnosis not present

## 2017-01-28 DIAGNOSIS — M25511 Pain in right shoulder: Secondary | ICD-10-CM | POA: Diagnosis not present

## 2017-01-28 NOTE — Progress Notes (Signed)
Location:   [place] Room Number: 244 B Place of Service:  SNF (31)   CODE STATUS: Full Code  Allergies  Allergen Reactions  . Cephalexin Nausea And Vomiting    Chief Complaint  Patient presents with  . Medical Management of Chronic Issues    Hypertension; anxiety; right shoulder pain     HPI:  He is a 83 year old long term resident of this facility being seen for the management of his chronic illnesses: hypertension; anxiety; right shoulder pain. He is unable to fully participate in the hpi or hpi. There are no reports of headaches; shoulder pain and anxiety. There are no nursing concerns at this time.   Past Medical History:  Diagnosis Date  . Brain tumor ([place])   . Chronic constipation   . Headache(784.0)   . Hypercalcemia   . Hypercholesteremia   . Hypertension   . Insomnia   . Irregular heartbeat     Past Surgical History:  Procedure Laterality Date  . BRAIN SURGERY     x3  . SHOULDER SURGERY     right    Social History   Social History  . Marital status: Married    Spouse name: Richard Mack  . Number of children: 2  . Years of education: 2nd   Occupational History  . Retired    Social History Main Topics  . Smoking status: Former Smoker    Years: 1.00    Types: Cigarettes    Quit date: 09/19/1976  . Smokeless tobacco: Never Used  . Alcohol use No  . Drug use: No  . Sexual activity: Not on file   Other Topics Concern  . Not on file   Social History Narrative   Patient lives at home with his wife Richard Mack). Patient has two children. Patient is retired. One cup coffee sometimes may every other day. Right handed.   Family History  Problem Relation Age of Onset  . Appendicitis Father   . AAA (abdominal aortic aneurysm) Father       VITAL SIGNS BP 118/72   Pulse 62   Temp 97.6 F (36.4 C)   Resp 18   Ht 5\' 6"  (1.676 m)   Wt 150 lb (68 kg)   SpO2 96%   BMI 24.21 kg/m   Patient's Medications  New Prescriptions   No  medications on file  Previous Medications   ACETAMINOPHEN (TYLENOL) 325 MG TABLET    Give two tablets by mouth in the morning and every 6 hours as needed for headache   DONEPEZIL (ARICEPT) 10 MG TABLET    Take 10 mg by mouth at bedtime.    HYDROCODONE-ACETAMINOPHEN (NORCO/VICODIN) 5-325 MG PER TABLET    Take 1 tablet by mouth every 8 (eight) hours as needed for moderate pain.   LISINOPRIL-HYDROCHLOROTHIAZIDE (PRINZIDE,ZESTORETIC) 20-25 MG PER TABLET    Take 0.5 tablets by mouth daily.   Modified Medications   No medications on file  Discontinued Medications   No medications on file     SIGNIFICANT DIAGNOSTIC EXAMS  NO NEW EXAMS   LABS REVIEWED: PREVIOUS   08-16-16: wbc 8.6; hgb 12.9; hct 37.9; mcv 96.6; plt 240; glucose 153; bun 15.7; creat 1.03; k+ 4.1; na++ 139; ca 12.3  tsh 1.02 09-21-16: PTH: 151.800  NO NEW LABS.   Review of Systems  Unable to perform ROS: Dementia (confused and hard of hearing )   Physical Exam  Constitutional: No distress.  Frail   Neck: Neck supple. No thyromegaly  present.  Cardiovascular: Normal rate, regular rhythm, normal heart sounds and intact distal pulses.   Pulmonary/Chest: Effort normal and breath sounds normal. No respiratory distress.  Abdominal: Soft. Bowel sounds are normal. He exhibits no distension. There is no tenderness.  Musculoskeletal: He exhibits no edema.  Lymphadenopathy:    He has no cervical adenopathy.  Neurological: He is alert.  Able to move to all extremities   Skin: Skin is warm and dry.    ASSESSMENT/ PLAN:  TODAY:    1. Hypertension: is stable: b/b118/72 will continue lisinopril/hct 20/25 mg 1/2 tab daily   2. Anxiety:is stable  is currently off all medications will monitor his status   3. Right shoulder pain: is stable has vicodin 5/325 mg tabs every 8 hours as needed for pain    PREVIOUS   4. Hyperparathyroidism: unchanged:  PTH 151.800 will monitor  5. Brain tumor: unchanged:  is status post surgery  X3; has headaches: will continue tylenol daily and every 6 hours as needed.   6. Dementia: without change: is on aricept 10 mg nightly  His weight is 150 pounds will monitor    MD is aware of resident's narcotic use and is in agreement with current plan of care. We will attempt to wean resident as apropriate     Ok Edwards NP [facility]  Contact [phone] Monday through Friday 8am- 5pm  After hours call [phone]

## 2017-02-04 DIAGNOSIS — E039 Hypothyroidism, unspecified: Secondary | ICD-10-CM | POA: Diagnosis not present

## 2017-02-04 DIAGNOSIS — R531 Weakness: Secondary | ICD-10-CM | POA: Diagnosis not present

## 2017-02-04 DIAGNOSIS — R51 Headache: Secondary | ICD-10-CM | POA: Diagnosis not present

## 2017-02-04 DIAGNOSIS — D649 Anemia, unspecified: Secondary | ICD-10-CM | POA: Diagnosis not present

## 2017-02-04 DIAGNOSIS — R5383 Other fatigue: Secondary | ICD-10-CM | POA: Diagnosis not present

## 2017-02-04 LAB — HEPATIC FUNCTION PANEL
ALT: 10 (ref 10–40)
AST: 12 — AB (ref 14–40)
Alkaline Phosphatase: 102 (ref 25–125)
Bilirubin, Total: 0.4

## 2017-02-04 LAB — CBC AND DIFFERENTIAL
HEMATOCRIT: 39 — AB (ref 41–53)
Hemoglobin: 12.8 — AB (ref 13.5–17.5)
NEUTROS ABS: 3
PLATELETS: 223 (ref 150–399)
WBC: 4.6

## 2017-02-04 LAB — BASIC METABOLIC PANEL WITH GFR
BUN: 24 — AB (ref 4–21)
Creatinine: 1.3 (ref 0.6–1.3)
Glucose: 98
Potassium: 4.7 (ref 3.4–5.3)
Sodium: 136 — AB (ref 137–147)

## 2017-02-04 LAB — TSH: TSH: 0.97 (ref 0.41–5.90)

## 2017-02-10 ENCOUNTER — Encounter: Payer: Self-pay | Admitting: Adult Health

## 2017-02-11 ENCOUNTER — Non-Acute Institutional Stay (SKILLED_NURSING_FACILITY): Payer: Medicare Other | Admitting: Adult Health

## 2017-02-11 DIAGNOSIS — E210 Primary hyperparathyroidism: Secondary | ICD-10-CM | POA: Diagnosis not present

## 2017-02-11 DIAGNOSIS — F0150 Vascular dementia without behavioral disturbance: Secondary | ICD-10-CM

## 2017-02-11 DIAGNOSIS — D496 Neoplasm of unspecified behavior of brain: Secondary | ICD-10-CM | POA: Diagnosis not present

## 2017-02-14 ENCOUNTER — Encounter: Payer: Self-pay | Admitting: Adult Health

## 2017-02-14 NOTE — Progress Notes (Signed)
Location:    [place] Room Number: [place] of Service:  SNF (31)   CODE STATUS: Full Code  Allergies  Allergen Reactions  . Cephalexin Nausea And Vomiting    Chief Complaint  Patient presents with  . Acute Visit    Care Plan Meeting    HPI:  We have come together with the care plan team and family for his routine care plan management. There are no nursing care issues at this time. He is currently living in the same room. We have discussed his overall medical status; dementia and treatment options for his dementia.  His family would like for his dementia to be treated. There are no reports of changes in behaviors; no changes in appetite and no complaints of pain. There are no nursing concerns at this time.   Past Medical History:  Diagnosis Date  . Brain tumor ([place])   . Chronic constipation   . Headache(784.0)   . Hypercalcemia   . Hypercholesteremia   . Hypertension   . Insomnia   . Irregular heartbeat     Past Surgical History:  Procedure Laterality Date  . BRAIN SURGERY     x3  . SHOULDER SURGERY     right    Social History   Social History  . Marital status: Married    Spouse name: Apolonio Schneiders  . Number of children: 2  . Years of education: 2nd   Occupational History  . Retired    Social History Main Topics  . Smoking status: Former Smoker    Years: 1.00    Types: Cigarettes    Quit date: 09/19/1976  . Smokeless tobacco: Never Used  . Alcohol use No  . Drug use: No  . Sexual activity: Not on file   Other Topics Concern  . Not on file   Social History Narrative   Patient lives at home with his wife Apolonio Schneiders). Patient has two children. Patient is retired. One cup coffee sometimes may every other day. Right handed.   Family History  Problem Relation Age of Onset  . Appendicitis Father   . AAA (abdominal aortic aneurysm) Father       VITAL SIGNS BP 114/62   Pulse 76   Temp 97.8 F (36.6 C)   Resp 18   Ht 5\' 6"  (1.676 m)    Wt 147 lb 14.4 oz (67.1 kg)   SpO2 96%   BMI 23.87 kg/m   Patient's Medications  New Prescriptions   No medications on file  Previous Medications   ACETAMINOPHEN (TYLENOL) 325 MG TABLET    Give two tablets by mouth in the morning and every 6 hours as needed for headache   DONEPEZIL (ARICEPT) 10 MG TABLET    Take 10 mg by mouth at bedtime.    HYDROCODONE-ACETAMINOPHEN (NORCO/VICODIN) 5-325 MG PER TABLET    Take 1 tablet by mouth every 8 (eight) hours as needed for moderate pain.   LISINOPRIL-HYDROCHLOROTHIAZIDE (PRINZIDE,ZESTORETIC) 20-25 MG PER TABLET    Take 0.5 tablets by mouth daily.   Modified Medications   No medications on file  Discontinued Medications   No medications on file     SIGNIFICANT DIAGNOSTIC EXAMS  NO NEW EXAMS   LABS REVIEWED: PREVIOUS   08-16-16: wbc 8.6; hgb 12.9; hct 37.9; mcv 96.6; plt 240; glucose 153; bun 15.7; creat 1.03; k+ 4.1; na++ 139; ca 12.3  tsh 1.02 09-21-16: PTH: 151.800  NO NEW LABS.    Review of Systems  Unable  to perform ROS: Dementia (hard of hearing and confused )   Physical Exam  Constitutional: No distress.  Frail   Neck: Neck supple. No thyromegaly present.  Cardiovascular: Normal rate, regular rhythm, normal heart sounds and intact distal pulses.   Pulmonary/Chest: Effort normal and breath sounds normal. No respiratory distress.  Abdominal: Soft. Bowel sounds are normal. He exhibits no distension. There is no tenderness.  Musculoskeletal: He exhibits no edema.  Able to move all extremities   Lymphadenopathy:    He has no cervical adenopathy.  Neurological: He is alert.  Skin: Skin is warm and dry. He is not diaphoretic.  Psychiatric: He has a normal mood and affect.     ASSESSMENT/ PLAN:  TODAY:   1. Dementia 2. Hyperparathyroidism 3. Brain tumor  40 minutes spent with care plan team; family and patient; discussing his medical status; his dementia; treatment options; his care plan and medications; family has  expressed understanding.   MD is aware of resident's narcotic use and is in agreement with current plan of care. We will attempt to wean resident as apropriate     Ok Edwards NP [facility] Medicine  Contact [phone] Monday through Friday 8am- 5pm  After hours call [phone]

## 2017-02-17 ENCOUNTER — Encounter: Payer: Self-pay | Admitting: Adult Health

## 2017-02-17 ENCOUNTER — Non-Acute Institutional Stay (SKILLED_NURSING_FACILITY): Payer: Medicare Other | Admitting: Adult Health

## 2017-02-17 DIAGNOSIS — F0150 Vascular dementia without behavioral disturbance: Secondary | ICD-10-CM | POA: Diagnosis not present

## 2017-02-17 DIAGNOSIS — D496 Neoplasm of unspecified behavior of brain: Secondary | ICD-10-CM

## 2017-02-17 DIAGNOSIS — E210 Primary hyperparathyroidism: Secondary | ICD-10-CM | POA: Diagnosis not present

## 2017-02-17 NOTE — Progress Notes (Signed)
Location:   [place] Room Number: [place] of Service:  SNF (31)   CODE STATUS:  Full Code  Allergies  Allergen Reactions  . Cephalexin Nausea And Vomiting    Chief Complaint  Patient presents with  . Medical Management of Chronic Issues    Hyperparathyroidism; brain tumor; dementia    HPI:  He is a 83 year old long term resident of this facility being seen for the management of his chronic illnesses: primary hyperparathyroidism; brain tumor and vascular dementia without behavioral disturbance. He is unable to participate in the hpi or ros. There are no reports of behavioral issues; changes in appetite or headaches. There are no nursing concerns at this time.   Past Medical History:  Diagnosis Date  . Brain tumor ([place])   . Chronic constipation   . Headache(784.0)   . Hypercalcemia   . Hypercholesteremia   . Hypertension   . Insomnia   . Irregular heartbeat     Past Surgical History:  Procedure Laterality Date  . BRAIN SURGERY     x3  . SHOULDER SURGERY     right    Social History   Social History  . Marital status: Married    Spouse name: Apolonio Schneiders  . Number of children: 2  . Years of education: 2nd   Occupational History  . Retired    Social History Main Topics  . Smoking status: Former Smoker    Years: 1.00    Types: Cigarettes    Quit date: 09/19/1976  . Smokeless tobacco: Never Used  . Alcohol use No  . Drug use: No  . Sexual activity: Not on file   Other Topics Concern  . Not on file   Social History Narrative   Patient lives at home with his wife Apolonio Schneiders). Patient has two children. Patient is retired. One cup coffee sometimes may every other day. Right handed.   Family History  Problem Relation Age of Onset  . Appendicitis Father   . AAA (abdominal aortic aneurysm) Father       VITAL SIGNS BP 128/76   Pulse 70   Ht 5\' 6"  (1.676 m)   Wt 147 lb 14.4 oz (67.1 kg)   BMI 23.87 kg/m   Patient's Medications  New  Prescriptions   No medications on file  Previous Medications   ACETAMINOPHEN (TYLENOL) 325 MG TABLET    Give two tablets by mouth in the morning and every 6 hours as needed for headache   DONEPEZIL (ARICEPT) 10 MG TABLET    Take 10 mg by mouth at bedtime.    HYDROCODONE-ACETAMINOPHEN (NORCO/VICODIN) 5-325 MG PER TABLET    Take 1 tablet by mouth every 8 (eight) hours as needed for moderate pain.   LISINOPRIL-HYDROCHLOROTHIAZIDE (PRINZIDE,ZESTORETIC) 20-25 MG PER TABLET    Take 0.5 tablets by mouth daily.   Modified Medications   No medications on file  Discontinued Medications   No medications on file     SIGNIFICANT DIAGNOSTIC EXAMS  NO NEW EXAMS   LABS REVIEWED: PREVIOUS   08-16-16: wbc 8.6; hgb 12.9; hct 37.9; mcv 96.6; plt 240; glucose 153; bun 15.7; creat 1.03; k+ 4.1; na++ 139; ca 12.3  tsh 1.02 09-21-16: PTH: 151.800  NO NEW LABS.   Review of Systems  Unable to perform ROS: Dementia (is hard of hearing and confused )   Physical Exam  Constitutional: No distress.  Frail   Neck: Neck supple. No thyromegaly present.  Cardiovascular: Normal rate, regular rhythm,  normal heart sounds and intact distal pulses.  Pulmonary/Chest: Effort normal and breath sounds normal. No respiratory distress.  Abdominal: Soft. Bowel sounds are normal. He exhibits no distension. There is no tenderness.  Musculoskeletal: He exhibits no edema.  Is able to move all extremities   Lymphadenopathy:    He has no cervical adenopathy.  Neurological: He is alert.  Skin: Skin is warm and dry. He is not diaphoretic.  Psychiatric: He has a normal mood and affect.    ASSESSMENT/ PLAN:   TODAY:   1. Hyperparathyroidism: unchanged:  PTH 151.800 will monitor  2. Brain tumor: unchanged:  is status post surgery X3; has headaches: will continue tylenol daily and every 6 hours as needed.   3. Dementia: without change: is on aricept 10 mg nightly  His weight is 147 pounds will monitor   PREVIOUS   4.  Hypertension: is stable: b/p128/76 will continue lisinopril/hct 20/25 mg 1/2 tab daily   5. Anxiety:is stable  is currently off all medications will monitor his status   6. Chronic headaches: is stable has vicodin 5/325 mg every 6 hours as needed for pain.    MD is aware of resident's narcotic use and is in agreement with current plan of care. We will attempt to wean resident as apropriate   Ok Edwards NP [facility] Adult Medicine  Contact [phone] Monday through Friday 8am- 5pm  After hours call [phone]

## 2017-02-28 NOTE — Progress Notes (Signed)
This encounter was created in error - please disregard.

## 2017-03-21 ENCOUNTER — Non-Acute Institutional Stay (SKILLED_NURSING_FACILITY): Payer: Medicare Other | Admitting: Internal Medicine

## 2017-03-21 ENCOUNTER — Encounter: Payer: Self-pay | Admitting: Internal Medicine

## 2017-03-21 DIAGNOSIS — I10 Essential (primary) hypertension: Secondary | ICD-10-CM

## 2017-03-21 DIAGNOSIS — E210 Primary hyperparathyroidism: Secondary | ICD-10-CM | POA: Diagnosis not present

## 2017-03-21 DIAGNOSIS — Z87898 Personal history of other specified conditions: Secondary | ICD-10-CM | POA: Diagnosis not present

## 2017-03-21 DIAGNOSIS — F0150 Vascular dementia without behavioral disturbance: Secondary | ICD-10-CM

## 2017-03-21 NOTE — Progress Notes (Signed)
Patient ID: Richard Mack, male   DOB: 04-06-1929, 83 y.o.   MRN: [id number]     DATE:  March 21, 2017  Location:   [facility] Room Number: 212 b Place of Service: SNF (31)   Extended Emergency Contact Information Primary Emergency Contact: Burkard,[name] Address: [address]          [place], [place] [address] [name] of [place] Phone: [phone] Work Phone: [phone] Mobile Phone: [phone] Relation: Spouse  Advanced Directive information Does Patient Have a Medical Advance Directive?: Yes, Type of Advance Directive: Out of facility DNR (pink MOST or yellow form), Pre-existing out of facility DNR order (yellow form or pink MOST form): Pink MOST form placed in chart (order not valid for inpatient use), Does patient want to make changes to medical advance directive?: No - Patient declined  Chief Complaint  Patient presents with  . Medical Management of Chronic Issues    1 month follow up    HPI:  83 yo male long term resident seen today for f/u. He has no concerns today. He shares a room with his spouse. No nursing issues at this time. Appetite ok and sleeps well. He is a poor historian due to dementia. Hx obtained from chart.  Primary Hyperparathyroidism - no change. PTH 151.8   Hx Brain tumor - unchanged. He is s/p surgery X3; he has headaches: takes tylenol daily and every 6 hours as needed.   Dementia - stable on aricept 10 mg nightly. Weight up 5 lbs since last month  Hypertension - stable on lisinopril/hct 20/25 mg 1/2 tab daily   Anxiety - mood stable without meds   Chronic headaches - stable on prn vicodin 5/325 mg every 6 hours    Past Medical History:  Diagnosis Date  . Brain tumor ([place])   . Chronic constipation   . Headache(784.0)   . Hypercalcemia   . Hypercholesteremia   . Hypertension   . Insomnia   . Irregular heartbeat     Past Surgical History:  Procedure Laterality Date  . BRAIN SURGERY     x3  . SHOULDER  SURGERY     right    Patient Care Team: Antony Contras, MD as PCP - General (Family Medicine)  Social History   Socioeconomic History  . Marital status: Married    Spouse name: Apolonio Schneiders  . Number of children: 2  . Years of education: 2nd  . Highest education level: Not on file  Social Needs  . Financial resource strain: Not on file  . Food insecurity - worry: Not on file  . Food insecurity - inability: Not on file  . Transportation needs - medical: Not on file  . Transportation needs - non-medical: Not on file  Occupational History  . Occupation: Retired  Tobacco Use  . Smoking status: Former Smoker    Years: 1.00    Types: Cigarettes    Last attempt to quit: 09/19/1976    Years since quitting: 40.5  . Smokeless tobacco: Never Used  Substance and Sexual Activity  . Alcohol use: No  . Drug use: No  . Sexual activity: Not on file  Other Topics Concern  . Not on file  Social History Narrative   Patient lives at home with his wife Apolonio Schneiders). Patient has two children. Patient is retired. One cup coffee sometimes may every other day. Right handed.     reports that he quit smoking about 40 years ago. His smoking use included cigarettes. He quit  after 1.00 year of use. he has never used smokeless tobacco. He reports that he does not drink alcohol or use drugs.  Family History  Problem Relation Age of Onset  . Appendicitis Father   . AAA (abdominal aortic aneurysm) Father    Family Status  Relation Name Status  . Father Clair Gulling Deceased at age 33's  . Mother  Deceased at age 72  . Sister [place]    Immunization History  Administered Date(s) Administered  . Influenza-Unspecified 02/05/2010, 02/18/2012, 03/12/2013, 02/14/2014  . PPD Test 08/17/2016  . Pneumococcal-Unspecified 11/06/2016  . Td 09/05/2011    Allergies  Allergen Reactions  . Cephalexin Nausea And Vomiting    Medications:   Medication List        Accurate as of 03/21/17  4:22 PM. Always use your  most recent med list.          acetaminophen 325 MG tablet Commonly known as:  TYLENOL   donepezil 10 MG tablet Commonly known as:  ARICEPT   HYDROcodone-acetaminophen 5-325 MG tablet Commonly known as:  NORCO/VICODIN Take 1 tablet by mouth every 8 (eight) hours as needed for moderate pain.   lisinopril-hydrochlorothiazide 20-25 MG tablet Commonly known as:  PRINZIDE,ZESTORETIC   memantine tablet pack Commonly known as:  [place] Start taking on:  03/25/2017       Review of Systems  Unable to perform ROS: Dementia    Vitals:   03/21/17 1144  BP: 111/70  Pulse: 94  Weight: 152 lb 3.2 oz (69 kg)  Height: 5\' 6"  (1.676 m)   Body mass index is 24.57 kg/m.  Physical Exam  Constitutional: He appears well-developed.  Frail appearing in NAD, sitting on bed  HENT:  Mouth/Throat: Oropharynx is clear and moist.  MMM; no oral thrush  Eyes: Pupils are equal, round, and reactive to light. No scleral icterus.  Neck: Neck supple. Carotid bruit is not present. No thyromegaly present.  Cardiovascular: Normal rate, regular rhythm and intact distal pulses. Exam reveals no gallop and no friction rub.  Murmur (1/6 SEM) heard. No distal LE edema. No calf TTP  Pulmonary/Chest: Effort normal and breath sounds normal. He has no wheezes. He has no rales. He exhibits no tenderness.  Abdominal: Soft. Normal appearance and bowel sounds are normal. He exhibits no distension, no abdominal bruit, no pulsatile midline mass and no mass. There is no hepatomegaly. There is no tenderness. There is no rigidity, no rebound and no guarding. No hernia.  Musculoskeletal: He exhibits edema.  Lymphadenopathy:    He has no cervical adenopathy.  Neurological: He is alert.  Skin: Skin is warm and dry. No rash noted.  Psychiatric: He has a normal mood and affect. His behavior is normal.     Labs reviewed: Abstract on 02/14/2017  Component Date Value Ref Range Status  . Hemoglobin  02/04/2017 12.8* 13.5 - 17.5 Final  . HCT 02/04/2017 39* 41 - 53 Final  . Neutrophils Absolute 02/04/2017 3   Final  . Platelets 02/04/2017 223  150 - 399 Final  . WBC 02/04/2017 4.6   Final  . Glucose 02/04/2017 98   Final  . BUN 02/04/2017 24* 4 - 21 Final  . Creatinine 02/04/2017 1.3  0.6 - 1.3 Final  . Potassium 02/04/2017 4.7  3.4 - 5.3 Final  . Sodium 02/04/2017 136* 137 - 147 Final  . Alkaline Phosphatase 02/04/2017 102  25 - 125 Final  . ALT 02/04/2017 10  10 - 40 Final  .  AST 02/04/2017 12* 14 - 40 Final  . Bilirubin, Total 02/04/2017 0.4   Final  . TSH 02/04/2017 0.97  0.41 - 5.90 Final    No results found.   Assessment/Plan   ICD-10-CM   1. Vascular dementia without behavioral disturbance F01.50   2. Essential hypertension I10   3. PRIMARY HYPERPARATHYROIDISM E21.0   4. History of brain tumor Z87.898      Cont current meds as ordered  PT/OT/ST as indicated  Cont nutritional supplements as ordered  F/u with specialists as indicated  Will follow  Adrieana Fennelly S. Perlie Gold  [facility] [address] [place],  [address] [phone] Cell (Monday-Friday 8 AM - 5 PM) [phone] After 5 PM and follow prompts

## 2017-04-02 ENCOUNTER — Encounter: Payer: Self-pay | Admitting: Internal Medicine

## 2017-04-02 DIAGNOSIS — Z87898 Personal history of other specified conditions: Secondary | ICD-10-CM | POA: Insufficient documentation

## 2017-04-08 ENCOUNTER — Emergency Department (HOSPITAL_COMMUNITY)
Admission: EM | Admit: 2017-04-08 | Discharge: 2017-04-08 | Disposition: A | Discharge status: Home / Self Care | Payer: Medicare Other | Attending: Emergency Medicine | Admitting: Emergency Medicine

## 2017-04-08 ENCOUNTER — Encounter (HOSPITAL_COMMUNITY): Payer: Self-pay | Admitting: Emergency Medicine

## 2017-04-08 DIAGNOSIS — R4182 Altered mental status, unspecified: Secondary | ICD-10-CM | POA: Diagnosis not present

## 2017-04-08 DIAGNOSIS — F0150 Vascular dementia without behavioral disturbance: Secondary | ICD-10-CM | POA: Diagnosis not present

## 2017-04-08 DIAGNOSIS — R531 Weakness: Secondary | ICD-10-CM | POA: Diagnosis not present

## 2017-04-08 DIAGNOSIS — I10 Essential (primary) hypertension: Secondary | ICD-10-CM | POA: Insufficient documentation

## 2017-04-08 DIAGNOSIS — Z86011 Personal history of benign neoplasm of the brain: Secondary | ICD-10-CM | POA: Insufficient documentation

## 2017-04-08 DIAGNOSIS — Z79899 Other long term (current) drug therapy: Secondary | ICD-10-CM | POA: Diagnosis not present

## 2017-04-08 DIAGNOSIS — F17200 Nicotine dependence, unspecified, uncomplicated: Secondary | ICD-10-CM | POA: Insufficient documentation

## 2017-04-08 DIAGNOSIS — R402441 Other coma, without documented Glasgow coma scale score, or with partial score reported, in the field [EMT or ambulance]: Secondary | ICD-10-CM | POA: Diagnosis not present

## 2017-04-08 DIAGNOSIS — R55 Syncope and collapse: Secondary | ICD-10-CM

## 2017-04-08 LAB — CBC WITH DIFFERENTIAL/PLATELET
Basophils Absolute: 0 10*3/uL (ref 0.0–0.1)
Basophils Relative: 0 %
Eosinophils Absolute: 0 10*3/uL (ref 0.0–0.7)
Eosinophils Relative: 0 %
HEMATOCRIT: 42.8 % (ref 39.0–52.0)
HEMOGLOBIN: 14.5 g/dL (ref 13.0–17.0)
LYMPHS ABS: 1.1 10*3/uL (ref 0.7–4.0)
Lymphocytes Relative: 11 %
MCH: 31.6 pg (ref 26.0–34.0)
MCHC: 33.9 g/dL (ref 30.0–36.0)
MCV: 93.2 fL (ref 78.0–100.0)
MONO ABS: 0.3 10*3/uL (ref 0.1–1.0)
MONOS PCT: 3 %
NEUTROS ABS: 9.3 10*3/uL — AB (ref 1.7–7.7)
NEUTROS PCT: 86 %
Platelets: 227 10*3/uL (ref 150–400)
RBC: 4.59 MIL/uL (ref 4.22–5.81)
RDW: 11.9 % (ref 11.5–15.5)
WBC: 10.7 10*3/uL — ABNORMAL HIGH (ref 4.0–10.5)

## 2017-04-08 LAB — COMPREHENSIVE METABOLIC PANEL
ALBUMIN: 3.9 g/dL (ref 3.5–5.0)
ALK PHOS: 125 U/L (ref 38–126)
ALT: 13 U/L — AB (ref 17–63)
ANION GAP: 9 (ref 5–15)
AST: 20 U/L (ref 15–41)
BUN: 20 mg/dL (ref 6–20)
CALCIUM: 11.7 mg/dL — AB (ref 8.9–10.3)
CHLORIDE: 104 mmol/L (ref 101–111)
CO2: 23 mmol/L (ref 22–32)
CREATININE: 1.5 mg/dL — AB (ref 0.61–1.24)
GFR calc non Af Amer: 40 mL/min — ABNORMAL LOW (ref >60)
GFR, EST AFRICAN AMERICAN: 47 mL/min — AB (ref >60)
GLUCOSE: 110 mg/dL — AB (ref 65–99)
Potassium: 4.4 mmol/L (ref 3.5–5.1)
SODIUM: 136 mmol/L (ref 135–145)
Total Bilirubin: 0.7 mg/dL (ref 0.3–1.2)
Total Protein: 7 g/dL (ref 6.5–8.1)

## 2017-04-08 NOTE — ED Notes (Signed)
Ambulated pt from room to restroom, one assist, tolerated well.

## 2017-04-08 NOTE — ED Triage Notes (Signed)
Pt here from [facility] where he was walking with staff and became weak , pt had no loc , pt has no complaints , b/p was 102/60 with EMS

## 2017-04-08 NOTE — ED Provider Notes (Signed)
[name] EMERGENCY DEPARTMENT Provider Note   CSN: [id number] Arrival date & time: 04/08/17  2458     History   Chief Complaint Chief Complaint  Patient presents with  . Near Syncope    HPI Richard Mack is a 83 y.o. male.  83 year old male with past medical history including brain tumor, vascular dementia, primary hyperparathyroidism, hypertension, chronic headaches who presents with near syncope.  His nursing home reported to EMS that he was walking with staff when he became weak, no true loss of consciousness.  BP was 102/60 with EMS.  To me he states that he had gone to the bathroom and symptoms occurred after using the bathroom.  He endorses headaches similar to his chronic headaches but he denies any other areas of pain.  Specifically no chest pain.  He denies any breathing problems, vomiting, diarrhea, constipation, urinary symptoms, fevers, or other complaints.   The history is provided by the patient and the nursing home.  Near Syncope     Past Medical History:  Diagnosis Date  . Brain tumor ([place])   . Chronic constipation   . Headache(784.0)   . Hypercalcemia   . Hypercholesteremia   . Hypertension   . Insomnia   . Irregular heartbeat     Patient Active Problem List   Diagnosis Date Noted  . History of brain tumor 04/02/2017  . Chronic headaches 11/15/2016  . Vascular dementia without behavioral disturbance 08/17/2016  . Brain tumor ([place])   . Osteoporosis 07/01/2009  . PRIMARY HYPERPARATHYROIDISM 01/09/2009  . HYPERCALCEMIA 01/09/2009  . Anxiety state 01/09/2009  . Essential hypertension 01/09/2009    Past Surgical History:  Procedure Laterality Date  . BRAIN SURGERY     x3  . SHOULDER SURGERY     right       Home Medications    Prior to Admission medications   Medication Sig Start Date End Date Taking? Authorizing Provider  acetaminophen (TYLENOL) 325 MG tablet Give two tablets by mouth in the morning and every 6 hours  as needed for headache    [provider]  donepezil (ARICEPT) 10 MG tablet Take 10 mg by mouth at bedtime.     [provider]  HYDROcodone-acetaminophen (NORCO/VICODIN) 5-325 MG per tablet Take 1 tablet by mouth every 8 (eight) hours as needed for moderate pain. 04/25/13   [name], MD  lisinopril-hydrochlorothiazide (PRINZIDE,ZESTORETIC) 20-25 MG per tablet Take 0.5 tablets by mouth daily.     [provider]  memantine [facility] TITRATION PACK) tablet pack Take See admin instructions by mouth. Give 7 mg by mouth daily x 1 week then 14 mg by mouth daily x 7 days, then 21 mg by mouth daily x 1 week, then 28 mg by mouth daily long term 03/25/17   [provider]    Family History Family History  Problem Relation Age of Onset  . Appendicitis Father   . AAA (abdominal aortic aneurysm) Father     Social History Social History   Tobacco Use  . Smoking status: Former Smoker    Years: 1.00    Types: Cigarettes    Last attempt to quit: 09/19/1976    Years since quitting: 40.5  . Smokeless tobacco: Never Used  Substance Use Topics  . Alcohol use: No  . Drug use: No     Allergies   Cephalexin   Review of Systems Review of Systems  Cardiovascular: Positive for near-syncope.   All other systems reviewed and are negative  except that which was mentioned in HPI   Physical Exam Updated Vital Signs BP 116/76   Pulse (!) 110   Temp 98.1 F (36.7 C) (Oral)   Resp 18   SpO2 95%   Physical Exam  Constitutional: He is oriented to person, place, and time. He appears well-developed and well-nourished. No distress.  Frail elderly man awake and comfortable  HENT:  Head: Normocephalic and atraumatic.  Moist mucous membranes Edentulous  Eyes: Conjunctivae and EOM are normal. Pupils are equal, round, and reactive to light.  Neck: Neck supple.  Cardiovascular: Normal rate, regular rhythm and normal heart sounds.  No murmur  heard. Pulmonary/Chest: Effort normal and breath sounds normal.  Abdominal: Soft. Bowel sounds are normal. He exhibits no distension. There is no tenderness.  Musculoskeletal: He exhibits no edema.  Neurological: He is alert and oriented to person, place, and time.  Fluent speech 5/5 strength x all 4 extremities No facial asymmetry  Skin: Skin is warm and dry.  Psychiatric:  Calm, pleasant  Nursing note and vitals reviewed.    ED Treatments / Results  Labs (all labs ordered are listed, but only abnormal results are displayed) Labs Reviewed  COMPREHENSIVE METABOLIC PANEL - Abnormal; Notable for the following components:      Result Value   Glucose, Bld 110 (*)    Creatinine, Ser 1.50 (*)    Calcium 11.7 (*)    ALT 13 (*)    GFR calc non Af Amer 40 (*)    GFR calc Af Amer 47 (*)    All other components within normal limits  CBC WITH DIFFERENTIAL/PLATELET - Abnormal; Notable for the following components:   WBC 10.7 (*)    Neutro Abs 9.3 (*)    All other components within normal limits    EKG  EKG Interpretation  Date/Time:  Friday April 08 2017 10:24:10 EST Ventricular Rate:  90 PR Interval:    QRS Duration: 81 QT Interval:  352 QTC Calculation: 433 R Axis:   19 Text Interpretation:  Sinus rhythm Atrial premature complex Inferior infarct, old similar to previous Confirmed by Theotis Burrow [phone]) on 04/08/2017 10:35:32 AM       Radiology No results found.  Procedures Procedures (including critical care time)  Medications Ordered in ED Medications - No data to display   Initial Impression / Assessment and Plan / ED Course  I have reviewed the triage vital signs and the nursing notes.  Pertinent labs & imaging results that were available during my care of the patient were reviewed by me and considered in my medical decision making (see chart for details).     PT w/ episode of weakness while walking with staff at his nursing facility, no actual loss of  consciousness and no trauma.  He was comfortable on exam and denied any new complaints.  Vital signs reassuring.  EKG without ischemic changes or arrhythmia.  Obtain screening labs to rule out anemia, dehydration, or electrolyte disturbance.  Cr. 1.5 today, it has been around this point previously and BUN not significantly different from previous. Hgb 14.5. WBC 10.7, no infectious sx. He has been able to ambulate here and states he does not feel symptomatic when he walks. He has eaten a biscuit and denies complaints on reassessment. Considered IVF but he's tolerating food and liquid well here with no reports of vomiting or poor intake.  Encouraged continued fluids and discussed supportive measures with the patient and his family.  Return precautions given and patient  discharged back to nursing facility.  Final Clinical Impressions(s) / ED Diagnoses   Final diagnoses:  Weakness  Near syncope    ED Discharge Orders    None       Delane Stalling, Wenda Overland, MD 04/08/17 1409

## 2017-04-08 NOTE — Discharge Instructions (Signed)
KEEP DRINKING PLENTY OF FLUIDS AND STAND UP SLOWLY, WAIT A FEW SECONDS BEFORE WALKING FORWARD ESPECIALLY AFTER USING BATHROOM. RETURN TO ER IF ANY PASSING OUT, CHEST PAIN, OR BREATHING PROBLEMS.

## 2017-04-12 ENCOUNTER — Non-Acute Institutional Stay (SKILLED_NURSING_FACILITY): Payer: Medicare Other | Admitting: Adult Health

## 2017-04-12 ENCOUNTER — Encounter: Payer: Self-pay | Admitting: Adult Health

## 2017-04-12 DIAGNOSIS — R531 Weakness: Secondary | ICD-10-CM | POA: Diagnosis not present

## 2017-04-12 NOTE — Progress Notes (Signed)
Location:   [place] Room Number: [place] of Service:  SNF (31)   CODE STATUS: Full Code  Allergies  Allergen Reactions  . Cephalexin Nausea And Vomiting    Chief Complaint  Patient presents with  . Hospitalization Follow-up    ER Follow up    HPI:  He had been in the bathroom an suffered a near syncopal episode. He did not have chest pain.  He felt much better by the time he got into the ED. His lab work did return without any changes present. He has returned back to the facility without any changes. He tells me that he is feeling good; denies any weakness; no fatigue; no constipation.   Past Medical History:  Diagnosis Date  . Brain tumor ([place])   . Chronic constipation   . Headache(784.0)   . Hypercalcemia   . Hypercholesteremia   . Hypertension   . Insomnia   . Irregular heartbeat     Past Surgical History:  Procedure Laterality Date  . BRAIN SURGERY     x3  . SHOULDER SURGERY     right    Social History   Socioeconomic History  . Marital status: Married    Spouse name: Apolonio Schneiders  . Number of children: 2  . Years of education: 2nd  . Highest education level: Not on file  Social Needs  . Financial resource strain: Not on file  . Food insecurity - worry: Not on file  . Food insecurity - inability: Not on file  . Transportation needs - medical: Not on file  . Transportation needs - non-medical: Not on file  Occupational History  . Occupation: Retired  Tobacco Use  . Smoking status: Former Smoker    Years: 1.00    Types: Cigarettes    Last attempt to quit: 09/19/1976    Years since quitting: 40.5  . Smokeless tobacco: Never Used  Substance and Sexual Activity  . Alcohol use: No  . Drug use: No  . Sexual activity: Not on file  Other Topics Concern  . Not on file  Social History Narrative   Patient lives at home with his wife Apolonio Schneiders). Patient has two children. Patient is retired. One cup coffee sometimes may every other day. Right  handed.   Family History  Problem Relation Age of Onset  . Appendicitis Father   . AAA (abdominal aortic aneurysm) Father       VITAL SIGNS BP 122/78   Pulse 85   Temp 97.8 F (36.6 C)   Resp 18   Ht 5\' 6"  (1.676 m)   Wt 152 lb 3.2 oz (69 kg)   SpO2 97%   BMI 24.57 kg/m   Outpatient Encounter Medications as of 04/12/2017  Medication Sig Note  . acetaminophen (TYLENOL) 325 MG tablet Give two tablets by mouth in the morning and every 6 hours as needed for headache   . donepezil (ARICEPT) 10 MG tablet Take 10 mg by mouth at bedtime.    Marland Kitchen HYDROcodone-acetaminophen (NORCO/VICODIN) 5-325 MG per tablet Take 1 tablet by mouth every 8 (eight) hours as needed for moderate pain.   Marland Kitchen lisinopril-hydrochlorothiazide (PRINZIDE,ZESTORETIC) 20-25 MG per tablet Take 0.5 tablets by mouth daily.  12/20/2014: .  . memantine ([place] TITRATION PACK) tablet pack Take See admin instructions by mouth. Give 7 mg by mouth daily x 1 week then 14 mg by mouth daily x 7 days, then 21 mg by mouth daily x 1 week, then 28 mg by  mouth daily long term    No facility-administered encounter medications on file as of 04/12/2017.      SIGNIFICANT DIAGNOSTIC EXAMS  NO NEW EXAMS   LABS REVIEWED: PREVIOUS   08-16-16: wbc 8.6; hgb 12.9; hct 37.9; mcv 96.6; plt 240; glucose 153; bun 15.7; creat 1.03; k+ 4.1; na++ 139; ca 12.3  tsh 1.02 09-21-16: PTH: 151.800 02-04-17: wbc 4.6; hgb 12.8; hct 39; plt 223; glucose 98; bun 24; creat 1.3; k+ 4.6 na++ 136  Liver normal    tsh 0.97  TODAY:    04-08-17: wbc 10.7; hgb 14.5; hct 42.8; mcv 93.2; plt 227; glucose 110; bun 20; creat 1.50; k+ 4.4; na++ 136; ca 11.7; liver normal albumin 3.9    Review of Systems  Unable to perform ROS: Dementia (confusion)    Physical Exam  Constitutional: No distress.  Frail   HENT:  Mouth/Throat: Oropharynx is clear and moist.  Eyes: Conjunctivae are normal.  Neck: Neck supple. No thyromegaly present.  Cardiovascular: Normal rate,  regular rhythm, normal heart sounds and intact distal pulses.  Pulmonary/Chest: Effort normal and breath sounds normal. No respiratory distress.  Abdominal: Soft. Bowel sounds are normal. He exhibits no distension. There is no tenderness.  Musculoskeletal: Normal range of motion. He exhibits no edema.  Lymphadenopathy:    He has no cervical adenopathy.  Neurological: He is alert.  Skin: Skin is warm and dry. He is not diaphoretic.  Psychiatric: He has a normal mood and affect.   ASSESSMENT/ PLAN:   TODAY:   1. Weakness: is presently stable will not make changes will monitor   MD is aware of resident's narcotic use and is in agreement with current plan of care. We will attempt to wean resident as apropriate     Ok Edwards NP [facility]  Contact [phone] Monday through Friday 8am- 5pm  After hours call [phone]

## 2017-04-14 ENCOUNTER — Encounter: Payer: Self-pay | Admitting: Internal Medicine

## 2017-04-14 ENCOUNTER — Non-Acute Institutional Stay (SKILLED_NURSING_FACILITY): Payer: Medicare Other | Admitting: Internal Medicine

## 2017-04-14 DIAGNOSIS — Z87898 Personal history of other specified conditions: Secondary | ICD-10-CM | POA: Diagnosis not present

## 2017-04-14 DIAGNOSIS — Z0000 Encounter for general adult medical examination without abnormal findings: Secondary | ICD-10-CM

## 2017-04-14 DIAGNOSIS — F0150 Vascular dementia without behavioral disturbance: Secondary | ICD-10-CM | POA: Diagnosis not present

## 2017-04-14 DIAGNOSIS — E210 Primary hyperparathyroidism: Secondary | ICD-10-CM

## 2017-04-14 DIAGNOSIS — I10 Essential (primary) hypertension: Secondary | ICD-10-CM

## 2017-04-14 DIAGNOSIS — R51 Headache: Secondary | ICD-10-CM | POA: Diagnosis not present

## 2017-04-14 DIAGNOSIS — R519 Headache, unspecified: Secondary | ICD-10-CM

## 2017-04-14 NOTE — Progress Notes (Signed)
Patient ID: Richard Mack, male   DOB: 1928/08/06, 83 y.o.   MRN: [id number]  Provider:  DR Arletha Grippe Location: [facility] Room Number: [place] of Service:  SNF (31)   PCP: Gildardo Cranker, DO Patient Care Team: Gildardo Cranker, DO as PCP - General (Internal Medicine) Nyoka Cowden Phylis Bougie, NP as Nurse Practitioner (Geriatric Medicine) [facility] [place] ([place])  Extended Emergency Contact Information Primary Emergency Contact: Mccannon,[name] Address: [address]          [place], [place] [address] [name] of [place] Phone: [phone] Work Phone: [phone] Mobile Phone: [phone] Relation: Spouse  Code Status: DNR Goals of Care: Advanced Directive information Advanced Directives 05/11/2017  Does Patient Have a Medical Advance Directive? Yes  Type of Advance Directive Out of facility DNR (pink MOST or yellow form)  Does patient want to make changes to medical advance directive? No - Patient declined  Would patient like information on creating a medical advance directive? -  Pre-existing out of facility DNR order (yellow form or pink MOST form) Pink MOST form placed in chart (order not valid for inpatient use)     Chief Complaint  Patient presents with  . Annual Wellness Exam    Yearly AWV    HPI: Patient is a 83 y.o. male seen today for an annual wellness examination. BIMS score completed today 6/15. No falls. PHQ9  Score <2. He is a poor historian due to dementia. Hx obtained from chart. SNF labs reviewed - albumin 3.9; Hgb 12.8; Cr 1.3. He was seen in the ED 04/08/17 for weakness and near syncope. BP low and lisinopril hct stopped. No further weakness.  Primary Hyperparathyroidism - unchanged. PTH 151.8   Hx Brain tumor - stable. He is s/p surgery X3; he has headaches: takes tylenol daily and every 6 hours as needed.   Dementia - unchanged on aricept 10 mg nightly. Weight stable. Albumin  3.9  Hypertension - stable off lisinopril/hct 20/25 mg.   Anxiety - asymptomatic. He does not take any meds  Chronic headaches - he c/o HA today but overall stable on prn vicodin 5/325 mg every 6 hours  MMSE - Mini Mental State Exam 05/15/2017  Not completed: Unable to complete  incapacitated patient unable to answer questions appropriately    Past Medical History:  Diagnosis Date  . Brain tumor ([place])   . Chronic constipation   . Headache(784.0)   . Hypercalcemia   . Hypercholesteremia   . Hypertension   . Insomnia   . Irregular heartbeat    Past Surgical History:  Procedure Laterality Date  . BRAIN SURGERY     x3  . SHOULDER SURGERY     right    reports that he quit smoking about 40 years ago. His smoking use included cigarettes. He quit after 1.00 year of use. he has never used smokeless tobacco. He reports that he does not drink alcohol or use drugs. Social History   Socioeconomic History  . Marital status: Married    Spouse name: Apolonio Schneiders  . Number of children: 2  . Years of education: 2nd  . Highest education level: None  Social Needs  . Financial resource strain: None  . Food insecurity - worry: None  . Food insecurity - inability: None  . Transportation needs - medical: None  . Transportation needs - non-medical: None  Occupational History  . Occupation: Retired  Tobacco Use  . Smoking status: Former Smoker  Years: 1.00    Types: Cigarettes    Last attempt to quit: 09/19/1976    Years since quitting: 40.6  . Smokeless tobacco: Never Used  Substance and Sexual Activity  . Alcohol use: No  . Drug use: No  . Sexual activity: None  Other Topics Concern  . None  Social History Narrative  . None   Family History  Problem Relation Age of Onset  . Appendicitis Father   . AAA (abdominal aortic aneurysm) Father     Pertinent  Health Maintenance Due  Topic Date Due  . PNA vac Low Risk Adult (2 of 2 - PCV13) 11/06/2017  . INFLUENZA VACCINE   Discontinued   Fall Risk  04/14/2017 11/04/2016  Falls in the past year? No No   Depression screen [facility] 2/9 04/14/2017 11/04/2016  Decreased Interest 0 0  Down, Depressed, Hopeless 0 0  PHQ - 2 Score 0 0    Functional Status Survey: Is the patient deaf or have difficulty hearing?: No Does the patient have difficulty seeing, even when wearing glasses/contacts?: No Does the patient have difficulty concentrating, remembering, or making decisions?: Yes Does the patient have difficulty walking or climbing stairs?: No Does the patient have difficulty dressing or bathing?: No Does the patient have difficulty doing errands alone such as visiting a doctor's office or shopping?: Yes  Allergies  Allergen Reactions  . Cephalexin Nausea And Vomiting    Outpatient Encounter Medications as of 04/14/2017  Medication Sig  . acetaminophen (TYLENOL) 325 MG tablet Take 650 mg by mouth daily.   Marland Kitchen donepezil (ARICEPT) 10 MG tablet Take 10 mg by mouth at bedtime.   . [DISCONTINUED] HYDROcodone-acetaminophen (NORCO/VICODIN) 5-325 MG per tablet Take 1 tablet by mouth every 8 (eight) hours as needed for moderate pain.  . [DISCONTINUED] lisinopril-hydrochlorothiazide (PRINZIDE,ZESTORETIC) 20-25 MG per tablet Take 0.5 tablets by mouth daily.   . [DISCONTINUED] memantine (NAMENDA TITRATION PACK) tablet pack Take See admin instructions by mouth. Give 7 mg by mouth daily x 1 week then 14 mg by mouth daily x 7 days, then 21 mg by mouth daily x 1 week, then 28 mg by mouth daily long term   No facility-administered encounter medications on file as of 04/14/2017.     Review of Systems  Unable to perform ROS: Dementia    Vitals:   04/14/17 1547  BP: 130/78  Pulse: 72  Resp: 18  Temp: 98.4 F (36.9 C)  TempSrc: Oral  SpO2: 97%  Weight: 152 lb 3.2 oz (69 kg)  Height: 5\' 6"  (1.676 m)   Body mass index is 24.57 kg/m. Physical Exam  Constitutional: He appears well-developed. No distress.  Frail appearing  sitting on bed in NAD  HENT:  Head: Normocephalic and atraumatic.  Mouth/Throat: Oropharynx is clear and moist. No oropharyngeal exudate.  MMM; no oral thrush  Eyes: EOM are normal. Pupils are equal, round, and reactive to light. No scleral icterus.  Neck: Normal range of motion. Neck supple. Carotid bruit is not present. No tracheal deviation present. No thyromegaly present.  Cardiovascular: Normal rate, regular rhythm and intact distal pulses. Exam reveals no gallop and no friction rub.  Murmur (1/6 SEM) heard. No LE edema b/l. No calf TTP  Pulmonary/Chest: Effort normal and breath sounds normal. No respiratory distress. He has no wheezes. He has no rales. He exhibits no tenderness.  No rhonchi  Abdominal: Soft. Bowel sounds are normal. He exhibits no distension and no mass. There is no hepatosplenomegaly or hepatomegaly. There  is no tenderness. There is no rebound and no guarding. No hernia.  Musculoskeletal: He exhibits edema. He exhibits no deformity.  Lymphadenopathy:    He has no cervical adenopathy.  Neurological: He is alert.  Skin: Skin is warm and dry. No rash noted.  Heels soft b/l but no ulcerations or callus formation; skin dry b/l  Psychiatric: He has a normal mood and affect. His behavior is normal.  Vitals reviewed.   Labs reviewed: Basic Metabolic Panel: Recent Labs    04/29/17 1944 04/30/17 0528 05/01/17 0413  NA 137 137 135  K 4.6 4.7 4.1  CL 104 106 105  CO2 23 22 23   GLUCOSE 126* 90 91  BUN 22* 24* 17  CREATININE 1.66* 1.51* 1.24  CALCIUM 11.2* 10.9* 10.8*   Liver Function Tests: Recent Labs    06/08/16 0202 02/04/17 04/08/17 1145 04/29/17 1944  AST 18 12* 20 19  ALT 13* 10 13* 14*  ALKPHOS 79 102 125 113  BILITOT 0.9  --  0.7 0.6  PROT 8.1  --  7.0 6.8  ALBUMIN 4.8  --  3.9 3.8   No results for input(s): LIPASE, AMYLASE in the last 8760 hours. No results for input(s): AMMONIA in the last 8760 hours. CBC: Recent Labs    04/08/17 1145  04/29/17 1944 04/30/17 0528 05/05/17  WBC 10.7* 9.3 6.7 4.9  NEUTROABS 9.3* 8.0*  --  3  HGB 14.5 14.1 13.2 12.7*  HCT 42.8 42.1 39.5 38*  MCV 93.2 94.6 94.7  --   PLT 227 210 219 203   Cardiac Enzymes: Recent Labs    04/30/17 0001 04/30/17 0528 04/30/17 1248  TROPONINI <0.03 <0.03 <0.03   BNP: Invalid input(s): POCBNP No results found for: HGBA1C Lab Results  Component Value Date   TSH 0.97 02/04/2017   Lab Results  Component Value Date   VITAMINB12 484 10/03/2012   Lab Results  Component Value Date   FOLATE 7.4 10/03/2012   Lab Results  Component Value Date   IRON 16 (L) 08/18/2008   TIBC 184 (L) 08/18/2008   FERRITIN 2018 Result confirmed by automatic dilution. (H) 08/18/2008    Imaging and Procedures obtained recently: Dg Chest 2 View  Result Date: 04/29/2017 CLINICAL DATA:  Syncope EXAM: CHEST  2 VIEW COMPARISON:  02/09/2014 FINDINGS: Hypoventilation with bibasilar atelectasis. Negative for heart failure. Negative for pneumonia or effusion. Atherosclerotic aortic arch. IMPRESSION: Mild bibasilar atelectasis. Electronically Signed   By: Franchot Gallo M.D.   On: 04/29/2017 18:51   Ct Head Wo Contrast  Result Date: 04/29/2017 CLINICAL DATA:  Status post fall. Patient has a history of brain surgery. EXAM: CT HEAD WITHOUT CONTRAST CT CERVICAL SPINE WITHOUT CONTRAST TECHNIQUE: Multidetector CT imaging of the head and cervical spine was performed following the standard protocol without intravenous contrast. Multiplanar CT image reconstructions of the cervical spine were also generated. COMPARISON:  None. July 08, 2014, April 25, 2013 FINDINGS: CT HEAD FINDINGS Brain: No evidence of acute infarction, hemorrhage, hydrocephalus, extra-axial collection or mass lesion/mass effect. There is chronic diffuse atrophy. Chronic bilateral periventricular white matter small vessel ischemic changes identified. Small low-density is identified in the left frontal white matter  chronic unchanged compared prior exam in 2014. Small old lacunar infarctions in both basal ganglia are noted. Vascular: No hyperdense vessel or unexpected calcification. Skull: Normal. Negative for fracture or focal lesion. Sinuses/Orbits: No acute finding. Other: None. CT CERVICAL SPINE FINDINGS Alignment: Normal. Skull base and vertebrae: No acute fracture. No primary  bone lesion or focal pathologic process. Soft tissues and spinal canal: No prevertebral fluid or swelling. No visible canal hematoma. Disc levels: Degenerative joint changes of the spine with narrowed joint space and osteophyte formation are identified throughout cervical spine. Upper chest: Biapical scar are noted. Other: None. IMPRESSION: No focal acute intracranial abnormality identified. No acute fracture or dislocation of cervical spine. Electronically Signed   By: Abelardo Diesel M.D.   On: 04/29/2017 18:31   Ct Cervical Spine Wo Contrast  Result Date: 04/29/2017 CLINICAL DATA:  Status post fall. Patient has a history of brain surgery. EXAM: CT HEAD WITHOUT CONTRAST CT CERVICAL SPINE WITHOUT CONTRAST TECHNIQUE: Multidetector CT imaging of the head and cervical spine was performed following the standard protocol without intravenous contrast. Multiplanar CT image reconstructions of the cervical spine were also generated. COMPARISON:  None. July 08, 2014, April 25, 2013 FINDINGS: CT HEAD FINDINGS Brain: No evidence of acute infarction, hemorrhage, hydrocephalus, extra-axial collection or mass lesion/mass effect. There is chronic diffuse atrophy. Chronic bilateral periventricular white matter small vessel ischemic changes identified. Small low-density is identified in the left frontal white matter chronic unchanged compared prior exam in 2014. Small old lacunar infarctions in both basal ganglia are noted. Vascular: No hyperdense vessel or unexpected calcification. Skull: Normal. Negative for fracture or focal lesion. Sinuses/Orbits: No acute  finding. Other: None. CT CERVICAL SPINE FINDINGS Alignment: Normal. Skull base and vertebrae: No acute fracture. No primary bone lesion or focal pathologic process. Soft tissues and spinal canal: No prevertebral fluid or swelling. No visible canal hematoma. Disc levels: Degenerative joint changes of the spine with narrowed joint space and osteophyte formation are identified throughout cervical spine. Upper chest: Biapical scar are noted. Other: None. IMPRESSION: No focal acute intracranial abnormality identified. No acute fracture or dislocation of cervical spine. Electronically Signed   By: Abelardo Diesel M.D.   On: 04/29/2017 18:31    Assessment/Plan   ICD-10-CM   1. Encounter for Medicare annual wellness exam Z00.00   2. Vascular dementia without behavioral disturbance F01.50   3. History of brain tumor Z87.898   4. Chronic nonintractable headache, unspecified headache type R51   5. Essential hypertension I10   6. PRIMARY HYPERPARATHYROIDISM E21.0    I am unable to review and discuss with incapacitated patient certain preventive protocols, quality metrics, and best practice recommendations.   Cont current meds as ordered  Follow up with specialists as indicated  Recommend heel protectors to prevent breakdown of heels and float heels when in bed  Will follow  Labs/tests ordered: none    Finlay Godbee S. Perlie Gold  [facility] [address] [place], [place] [address] [phone] Cell (Monday-Friday 8 AM - 5 PM) [phone] After 5 PM and follow prompts

## 2017-04-18 ENCOUNTER — Encounter: Payer: Self-pay | Admitting: Adult Health

## 2017-04-18 ENCOUNTER — Non-Acute Institutional Stay (SKILLED_NURSING_FACILITY): Payer: Medicare Other | Admitting: Adult Health

## 2017-04-18 DIAGNOSIS — G8929 Other chronic pain: Secondary | ICD-10-CM

## 2017-04-18 DIAGNOSIS — F411 Generalized anxiety disorder: Secondary | ICD-10-CM | POA: Diagnosis not present

## 2017-04-18 DIAGNOSIS — I10 Essential (primary) hypertension: Secondary | ICD-10-CM | POA: Diagnosis not present

## 2017-04-18 DIAGNOSIS — R51 Headache: Secondary | ICD-10-CM | POA: Diagnosis not present

## 2017-04-18 NOTE — Progress Notes (Signed)
Location:   [place] Room Number: [place] of Service:  SNF (31)   CODE STATUS: Full Code  Allergies  Allergen Reactions  . Cephalexin Nausea And Vomiting    Chief Complaint  Patient presents with  . Medical Management of Chronic Issues    Hypertension; anxiety; headache    HPI:  He is a 83 year Mack long term resident of this facility being seen for the management of his chronic illnesses; essential hypertension; anxiety state; and chronic headaches.  He is unable to participate in the hpi or ros. He has been seen in the ED for presyncopal episode; he has not had any further issues. There are reports of vertigo; headaches; or changes in appetite.   Past Medical History:  Diagnosis Date  . Brain tumor ([place])   . Chronic constipation   . Headache(784.0)   . Hypercalcemia   . Hypercholesteremia   . Hypertension   . Insomnia   . Irregular heartbeat     Past Surgical History:  Procedure Laterality Date  . BRAIN SURGERY     x3  . SHOULDER SURGERY     right    Social History   Socioeconomic History  . Marital status: Married    Spouse name: Apolonio Schneiders  . Number of children: 2  . Years of education: 2nd  . Highest education level: Not on file  Social Needs  . Financial resource strain: Not on file  . Food insecurity - worry: Not on file  . Food insecurity - inability: Not on file  . Transportation needs - medical: Not on file  . Transportation needs - non-medical: Not on file  Occupational History  . Occupation: Retired  Tobacco Use  . Smoking status: Former Smoker    Years: 1.00    Types: Cigarettes    Last attempt to quit: 09/19/1976    Years since quitting: 40.6  . Smokeless tobacco: Never Used  Substance and Sexual Activity  . Alcohol use: No  . Drug use: No  . Sexual activity: Not on file  Other Topics Concern  . Not on file  Social History Narrative  . Not on file   Family History  Problem Relation Age of Onset  . Appendicitis  Father   . AAA (abdominal aortic aneurysm) Father       VITAL SIGNS BP 130/80   Pulse 86   Temp 98.4 F (36.9 C)   Resp 18   Ht 5\' 6"  (1.676 m)   Wt 146 lb (66.2 kg)   SpO2 97%   BMI 23.57 kg/m   Outpatient Encounter Medications as of 04/18/2017  Medication Sig Note  . acetaminophen (TYLENOL) 325 MG tablet Give two tablets by mouth in the morning and every 6 hours as needed for headache   . donepezil (ARICEPT) 10 MG tablet Take 10 mg by mouth at bedtime.    Marland Kitchen HYDROcodone-acetaminophen (NORCO/VICODIN) 5-325 MG per tablet Take 1 tablet by mouth every 8 (eight) hours as needed for moderate pain.   Marland Kitchen lisinopril-hydrochlorothiazide (PRINZIDE,ZESTORETIC) 20-25 MG per tablet Take 0.5 tablets by mouth daily.  12/20/2014: .  . memantine (NAMENDA TITRATION PACK) tablet pack Take See admin instructions by mouth. Give 7 mg by mouth daily x 1 week then 14 mg by mouth daily x 7 days, then 21 mg by mouth daily x 1 week, then 28 mg by mouth daily long term    No facility-administered encounter medications on file as of 04/18/2017.  SIGNIFICANT DIAGNOSTIC EXAMS  NO NEW EXAMS   LABS REVIEWED: PREVIOUS   08-16-16: wbc 8.6; hgb 12.9; hct 37.9; mcv 96.6; plt 240; glucose 153; bun 15.7; creat 1.03; k+ 4.1; na++ 139; ca 12.3  tsh 1.02 09-21-16: PTH: 151.800 02-04-17: wbc 4.6; hgb 12.8; hct 39; plt 223; glucose 98; bun 24; creat 1.3; k+ 4.6 na++ 136  Liver normal    tsh 0.97 04-08-17: wbc 10.7; hgb 14.5; hct 42.8; mcv 93.2; plt 227; glucose 110; bun 20; creat 1.50; k+ 4.4; na++ 136; ca 11.7; liver normal albumin 3.9  NO NEW EXAMS     Review of Systems  Unable to perform ROS: Dementia (is confused )    Physical Exam  Constitutional: No distress.  Frail   Neck: No thyromegaly present.  Cardiovascular: Normal rate, regular rhythm and intact distal pulses.  Murmur heard. 1/6  Pulmonary/Chest: Effort normal and breath sounds normal. No respiratory distress.  Abdominal: Soft. Bowel sounds  are normal. He exhibits no distension. There is no tenderness.  Musculoskeletal: Normal range of motion. He exhibits no edema.  Lymphadenopathy:    He has no cervical adenopathy.  Neurological: He is alert.  Skin: Skin is warm and dry. He is not diaphoretic.  Psychiatric: He has a normal mood and affect.   ASSESSMENT/ PLAN:  TODAY:   1. Hypertension: is stable: b/p130/80 will continue lisinopril/hct 20/25 mg 1/2 tab daily   2. Anxiety:is stable  is currently off all medications will monitor his status   3. Chronic headaches: is stable has vicodin 5/325 mg every 6 hours as needed for pain.   PREVIOUS   4. Hyperparathyroidism: unchanged:  PTH 151.800 will monitor  5. Brain tumor: unchanged:  is status post surgery X3; has headaches: will continue tylenol daily and every 6 hours as needed.   6. Dementia: without change: is on aricept 10 mg nightly and namenda xr 28 mg daily  His weight is 146 pounds will monitor   Will check cbc; cmp    MD is aware of resident's narcotic use and is in agreement with current plan of care. We will attempt to wean resident as apropriate   Ok Edwards NP [facility]  Contact [phone] Monday through Friday 8am- 5pm  After hours call [phone]

## 2017-04-29 ENCOUNTER — Emergency Department (HOSPITAL_COMMUNITY): Payer: Medicare Other

## 2017-04-29 ENCOUNTER — Inpatient Hospital Stay (HOSPITAL_COMMUNITY)
Admission: EM | Admit: 2017-04-29 | Discharge: 2017-05-01 | DRG: 641 | Disposition: A | Discharge status: Skilled Nursing Facility | Payer: Medicare Other | Attending: Internal Medicine | Admitting: Internal Medicine

## 2017-04-29 ENCOUNTER — Other Ambulatory Visit: Payer: Self-pay

## 2017-04-29 ENCOUNTER — Encounter (HOSPITAL_COMMUNITY): Payer: Self-pay

## 2017-04-29 DIAGNOSIS — F0150 Vascular dementia without behavioral disturbance: Secondary | ICD-10-CM | POA: Diagnosis present

## 2017-04-29 DIAGNOSIS — E7800 Pure hypercholesterolemia, unspecified: Secondary | ICD-10-CM | POA: Diagnosis present

## 2017-04-29 DIAGNOSIS — E861 Hypovolemia: Principal | ICD-10-CM | POA: Diagnosis present

## 2017-04-29 DIAGNOSIS — I951 Orthostatic hypotension: Secondary | ICD-10-CM | POA: Diagnosis not present

## 2017-04-29 DIAGNOSIS — S199XXA Unspecified injury of neck, initial encounter: Secondary | ICD-10-CM | POA: Diagnosis not present

## 2017-04-29 DIAGNOSIS — J9811 Atelectasis: Secondary | ICD-10-CM | POA: Diagnosis not present

## 2017-04-29 DIAGNOSIS — R42 Dizziness and giddiness: Secondary | ICD-10-CM

## 2017-04-29 DIAGNOSIS — R55 Syncope and collapse: Secondary | ICD-10-CM | POA: Diagnosis not present

## 2017-04-29 DIAGNOSIS — Z87891 Personal history of nicotine dependence: Secondary | ICD-10-CM

## 2017-04-29 DIAGNOSIS — F411 Generalized anxiety disorder: Secondary | ICD-10-CM | POA: Diagnosis not present

## 2017-04-29 DIAGNOSIS — S0990XA Unspecified injury of head, initial encounter: Secondary | ICD-10-CM | POA: Diagnosis not present

## 2017-04-29 DIAGNOSIS — R404 Transient alteration of awareness: Secondary | ICD-10-CM | POA: Diagnosis not present

## 2017-04-29 DIAGNOSIS — E210 Primary hyperparathyroidism: Secondary | ICD-10-CM | POA: Diagnosis not present

## 2017-04-29 DIAGNOSIS — I10 Essential (primary) hypertension: Secondary | ICD-10-CM | POA: Diagnosis not present

## 2017-04-29 DIAGNOSIS — E785 Hyperlipidemia, unspecified: Secondary | ICD-10-CM | POA: Diagnosis present

## 2017-04-29 LAB — URINALYSIS, ROUTINE W REFLEX MICROSCOPIC
Bilirubin Urine: NEGATIVE
Glucose, UA: NEGATIVE mg/dL
Hgb urine dipstick: NEGATIVE
Ketones, ur: NEGATIVE mg/dL
LEUKOCYTES UA: NEGATIVE
NITRITE: NEGATIVE
PH: 5 (ref 5.0–8.0)
Protein, ur: NEGATIVE mg/dL
SPECIFIC GRAVITY, URINE: 1.018 (ref 1.005–1.030)

## 2017-04-29 LAB — COMPREHENSIVE METABOLIC PANEL
ALT: 14 U/L — AB (ref 17–63)
ANION GAP: 10 (ref 5–15)
AST: 19 U/L (ref 15–41)
Albumin: 3.8 g/dL (ref 3.5–5.0)
Alkaline Phosphatase: 113 U/L (ref 38–126)
BUN: 22 mg/dL — ABNORMAL HIGH (ref 6–20)
CHLORIDE: 104 mmol/L (ref 101–111)
CO2: 23 mmol/L (ref 22–32)
CREATININE: 1.66 mg/dL — AB (ref 0.61–1.24)
Calcium: 11.2 mg/dL — ABNORMAL HIGH (ref 8.9–10.3)
GFR calc non Af Amer: 36 mL/min — ABNORMAL LOW (ref >60)
GFR, EST AFRICAN AMERICAN: 41 mL/min — AB (ref >60)
Glucose, Bld: 126 mg/dL — ABNORMAL HIGH (ref 65–99)
POTASSIUM: 4.6 mmol/L (ref 3.5–5.1)
SODIUM: 137 mmol/L (ref 135–145)
Total Bilirubin: 0.6 mg/dL (ref 0.3–1.2)
Total Protein: 6.8 g/dL (ref 6.5–8.1)

## 2017-04-29 LAB — CBC WITH DIFFERENTIAL/PLATELET
Basophils Absolute: 0 10*3/uL (ref 0.0–0.1)
Basophils Relative: 0 %
EOS ABS: 0 10*3/uL (ref 0.0–0.7)
EOS PCT: 0 %
HCT: 42.1 % (ref 39.0–52.0)
Hemoglobin: 14.1 g/dL (ref 13.0–17.0)
LYMPHS ABS: 0.7 10*3/uL (ref 0.7–4.0)
Lymphocytes Relative: 7 %
MCH: 31.7 pg (ref 26.0–34.0)
MCHC: 33.5 g/dL (ref 30.0–36.0)
MCV: 94.6 fL (ref 78.0–100.0)
Monocytes Absolute: 0.6 10*3/uL (ref 0.1–1.0)
Monocytes Relative: 7 %
Neutro Abs: 8 10*3/uL — ABNORMAL HIGH (ref 1.7–7.7)
Neutrophils Relative %: 86 %
PLATELETS: 210 10*3/uL (ref 150–400)
RBC: 4.45 MIL/uL (ref 4.22–5.81)
RDW: 12.1 % (ref 11.5–15.5)
WBC: 9.3 10*3/uL (ref 4.0–10.5)

## 2017-04-29 MED ORDER — SODIUM CHLORIDE 0.9% FLUSH: 3.0000 mL | INTRAVENOUS | Status: DC | PRN

## 2017-04-29 MED ORDER — DONEPEZIL HCL 10 MG PO TABS
10.0000 mg | ORAL_TABLET | Freq: Every day | ORAL | Status: DC
  Administered 2017-04-30 (×2): 10 mg via ORAL
  Filled 2017-04-29 (×3): qty 1

## 2017-04-29 MED ORDER — LISINOPRIL-HYDROCHLOROTHIAZIDE 20-25 MG PO TABS: 0.5000 | ORAL_TABLET | Freq: Every day | ORAL | Status: DC

## 2017-04-29 MED ORDER — SODIUM CHLORIDE 0.9% FLUSH
3.0000 mL | Freq: Two times a day (BID) | INTRAVENOUS | Status: DC
  Administered 2017-04-30 – 2017-05-01 (×3): 3 mL via INTRAVENOUS

## 2017-04-29 MED ORDER — MEMANTINE HCL ER 28 MG PO CP24
28.0000 mg | ORAL_CAPSULE | Freq: Every day | ORAL | Status: DC
  Administered 2017-04-30 – 2017-05-01 (×2): 28 mg via ORAL
  Filled 2017-04-29 (×2): qty 1

## 2017-04-29 MED ORDER — SODIUM CHLORIDE 0.9 % IV BOLUS (SEPSIS)
1000.0000 mL | Freq: Once | INTRAVENOUS | Status: AC
  Administered 2017-04-29: 1000 mL via INTRAVENOUS

## 2017-04-29 MED ORDER — SODIUM CHLORIDE 0.9 % IV SOLN: 250.0000 mL | INTRAVENOUS | Status: DC | PRN

## 2017-04-29 MED ORDER — HYDROCODONE-ACETAMINOPHEN 5-325 MG PO TABS
1.0000 | ORAL_TABLET | Freq: Three times a day (TID) | ORAL | Status: DC | PRN
  Administered 2017-04-30: 1 via ORAL
  Filled 2017-04-29: qty 1

## 2017-04-29 MED ORDER — LISINOPRIL 10 MG PO TABS: 10.0000 mg | ORAL_TABLET | Freq: Every day | ORAL | Status: DC

## 2017-04-29 MED ORDER — ACETAMINOPHEN 325 MG PO TABS: 650.0000 mg | ORAL_TABLET | Freq: Four times a day (QID) | ORAL | Status: DC | PRN

## 2017-04-29 MED ORDER — ACETAMINOPHEN 325 MG PO TABS
650.0000 mg | ORAL_TABLET | Freq: Every day | ORAL | Status: DC
  Administered 2017-04-30 – 2017-05-01 (×2): 650 mg via ORAL
  Filled 2017-04-29 (×2): qty 2

## 2017-04-29 MED ORDER — HYDROCHLOROTHIAZIDE 12.5 MG PO CAPS: 12.5000 mg | ORAL_CAPSULE | Freq: Every day | ORAL | Status: DC

## 2017-04-29 NOTE — ED Notes (Signed)
This RN noticed IV had infiltrated. IV team notified and consulted for placement of new IV.

## 2017-04-29 NOTE — ED Notes (Signed)
Patient transported to X-ray 

## 2017-04-29 NOTE — ED Notes (Signed)
IV team at bedside 

## 2017-04-29 NOTE — ED Triage Notes (Signed)
Pt arrived from [facility] where staff report hearing pt fall while in [facility]. Pt has no complaints at this time. A&O at baseline per staff.

## 2017-04-29 NOTE — H&P (Signed)
History and Physical    Richard Mack [id number]:[id number] DOB: 06-14-1928 DOA: 04/29/2017  PCP: Antony Contras, MD  Patient coming from:  SNF  Chief Complaint:  [place], passed out  HPI: Richard Mack is a 83 y.o. male with medical history significant of dementia, HTN comes in after found down likely after a syncopal episode.  Pt has dementia so history is very limited.  He says he got up and got dizzy and passed out.   He denies any pain.  When he was found by staff he was on the floor, he had hit his head, his oxygen sats were low in the mid 70s and tachycardic.  He says he has not been eating or drinking very well.  Denies any fevers.  No n/v/d.  Pt referred for admit for syncopal episode.  Review of Systems: As per HPI otherwise 10 point review of systems negative.   Past Medical History:  Diagnosis Date  . Brain tumor ([place])   . Chronic constipation   . Headache(784.0)   . Hypercalcemia   . Hypercholesteremia   . Hypertension   . Insomnia   . Irregular heartbeat     Past Surgical History:  Procedure Laterality Date  . BRAIN SURGERY     x3  . SHOULDER SURGERY     right     reports that he quit smoking about 40 years ago. His smoking use included cigarettes. He quit after 1.00 year of use. he has never used smokeless tobacco. He reports that he does not drink alcohol or use drugs.  Allergies  Allergen Reactions  . Cephalexin Nausea And Vomiting    Family History  Problem Relation Age of Onset  . Appendicitis Father   . AAA (abdominal aortic aneurysm) Father     Prior to Admission medications   Medication Sig Start Date End Date Taking? Authorizing Provider  acetaminophen (TYLENOL) 325 MG tablet Give two tablets by mouth in the morning and every 6 hours as needed for headache    [provider]  donepezil (ARICEPT) 10 MG tablet Take 10 mg by mouth at bedtime.     [provider]  HYDROcodone-acetaminophen (NORCO/VICODIN) 5-325 MG per tablet Take 1 tablet by  mouth every 8 (eight) hours as needed for moderate pain. 04/25/13   [name], MD  lisinopril-hydrochlorothiazide (PRINZIDE,ZESTORETIC) 20-25 MG per tablet Take 0.5 tablets by mouth daily.     [provider]  memantine [facility] TITRATION PACK) tablet pack Take See admin instructions by mouth. Give 7 mg by mouth daily x 1 week then 14 mg by mouth daily x 7 days, then 21 mg by mouth daily x 1 week, then 28 mg by mouth daily long term 03/25/17   [provider]    Physical Exam: Vitals:   04/29/17 1941 04/29/17 2000 04/29/17 2030 04/29/17 2145  BP:  117/73 108/73 122/67  Pulse: 95 98 94 (!) 101  Resp: 15 (!) 21 14 14   Temp:      TempSrc:      SpO2: 97% 95% 98% 94%  Weight:      Height:          Constitutional: NAD, calm, comfortable Vitals:   04/29/17 1941 04/29/17 2000 04/29/17 2030 04/29/17 2145  BP:  117/73 108/73 122/67  Pulse: 95 98 94 (!) 101  Resp: 15 (!) 21 14 14   Temp:      TempSrc:      SpO2: 97% 95% 98% 94%  Weight:  Height:       Eyes: PERRL, lids and conjunctivae normal ENMT: Mucous membranes are moist. Posterior pharynx clear of any exudate or lesions.Normal dentition.  Neck: normal, supple, no masses, no thyromegaly Respiratory: clear to auscultation bilaterally, no wheezing, no crackles. Normal respiratory effort. No accessory muscle use.  Cardiovascular: Regular rate and rhythm, no murmurs / rubs / gallops. No extremity edema. 2+ pedal pulses. No carotid bruits.  Abdomen: no tenderness, no masses palpated. No hepatosplenomegaly. Bowel sounds positive.  Musculoskeletal: no clubbing / cyanosis. No joint deformity upper and lower extremities. Good ROM, no contractures. Normal muscle tone.  Skin: no rashes, lesions, ulcers. No induration Neurologic: CN 2-12 grossly intact. Sensation intact, DTR normal. Strength 5/5 in all 4.  Psychiatric: Normal judgment and insight. Alert and oriented x 3. Normal mood.    Labs on Admission: I have  personally reviewed following labs and imaging studies  CBC: Recent Labs  Lab 04/29/17 1944  WBC 9.3  NEUTROABS 8.0*  HGB 14.1  HCT 42.1  MCV 94.6  PLT 176   Basic Metabolic Panel: Recent Labs  Lab 04/29/17 1944  NA 137  K 4.6  CL 104  CO2 23  GLUCOSE 126*  BUN 22*  CREATININE 1.66*  CALCIUM 11.2*   GFR: Estimated Creatinine Clearance: 28.8 mL/min (A) (by C-G formula based on SCr of 1.66 mg/dL (H)). Liver Function Tests: Recent Labs  Lab 04/29/17 1944  AST 19  ALT 14*  ALKPHOS 113  BILITOT 0.6  PROT 6.8  ALBUMIN 3.8   No results for input(s): LIPASE, AMYLASE in the last 168 hours. No results for input(s): AMMONIA in the last 168 hours. Coagulation Profile: No results for input(s): INR, PROTIME in the last 168 hours. Cardiac Enzymes: No results for input(s): CKTOTAL, CKMB, CKMBINDEX, TROPONINI in the last 168 hours. BNP (last 3 results) No results for input(s): PROBNP in the last 8760 hours. HbA1C: No results for input(s): HGBA1C in the last 72 hours. CBG: No results for input(s): GLUCAP in the last 168 hours. Lipid Profile: No results for input(s): CHOL, HDL, LDLCALC, TRIG, CHOLHDL, LDLDIRECT in the last 72 hours. Thyroid Function Tests: No results for input(s): TSH, T4TOTAL, FREET4, T3FREE, THYROIDAB in the last 72 hours. Anemia Panel: No results for input(s): VITAMINB12, FOLATE, FERRITIN, TIBC, IRON, RETICCTPCT in the last 72 hours. Urine analysis:    Component Value Date/Time   COLORURINE AMBER (A) 02/09/2014 0850   APPEARANCEUR CLEAR 02/09/2014 0850   LABSPEC 1.028 02/09/2014 0850   PHURINE 5.5 02/09/2014 0850   GLUCOSEU NEGATIVE 02/09/2014 0850   HGBUR NEGATIVE 02/09/2014 0850   BILIRUBINUR NEGATIVE 02/09/2014 0850   KETONESUR NEGATIVE 02/09/2014 0850   PROTEINUR NEGATIVE 02/09/2014 0850   UROBILINOGEN 1.0 02/09/2014 0850   NITRITE NEGATIVE 02/09/2014 0850   LEUKOCYTESUR NEGATIVE 02/09/2014 0850   Sepsis Labs:  !!!!!!!!!!!!!!!!!!!!!!!!!!!!!!!!!!!!!!!!!!!! @LABRCNTIP (procalcitonin:4,lacticidven:4) )No results found for this or any previous visit (from the past 240 hour(s)).   Radiological Exams on Admission: Dg Chest 2 View  Result Date: 04/29/2017 CLINICAL DATA:  Syncope EXAM: CHEST  2 VIEW COMPARISON:  02/09/2014 FINDINGS: Hypoventilation with bibasilar atelectasis. Negative for heart failure. Negative for pneumonia or effusion. Atherosclerotic aortic arch. IMPRESSION: Mild bibasilar atelectasis. Electronically Signed   By: Franchot Gallo M.D.   On: 04/29/2017 18:51   Ct Head Wo Contrast  Result Date: 04/29/2017 CLINICAL DATA:  Status post fall. Patient has a history of brain surgery. EXAM: CT HEAD WITHOUT CONTRAST CT CERVICAL SPINE WITHOUT CONTRAST TECHNIQUE: Multidetector CT imaging  of the head and cervical spine was performed following the standard protocol without intravenous contrast. Multiplanar CT image reconstructions of the cervical spine were also generated. COMPARISON:  None. July 08, 2014, April 25, 2013 FINDINGS: CT HEAD FINDINGS Brain: No evidence of acute infarction, hemorrhage, hydrocephalus, extra-axial collection or mass lesion/mass effect. There is chronic diffuse atrophy. Chronic bilateral periventricular white matter small vessel ischemic changes identified. Small low-density is identified in the left frontal white matter chronic unchanged compared prior exam in 2014. Small old lacunar infarctions in both basal ganglia are noted. Vascular: No hyperdense vessel or unexpected calcification. Skull: Normal. Negative for fracture or focal lesion. Sinuses/Orbits: No acute finding. Other: None. CT CERVICAL SPINE FINDINGS Alignment: Normal. Skull base and vertebrae: No acute fracture. No primary bone lesion or focal pathologic process. Soft tissues and spinal canal: No prevertebral fluid or swelling. No visible canal hematoma. Disc levels: Degenerative joint changes of the spine with narrowed  joint space and osteophyte formation are identified throughout cervical spine. Upper chest: Biapical scar are noted. Other: None. IMPRESSION: No focal acute intracranial abnormality identified. No acute fracture or dislocation of cervical spine. Electronically Signed   By: Abelardo Diesel M.D.   On: 04/29/2017 18:31   Ct Cervical Spine Wo Contrast  Result Date: 04/29/2017 CLINICAL DATA:  Status post fall. Patient has a history of brain surgery. EXAM: CT HEAD WITHOUT CONTRAST CT CERVICAL SPINE WITHOUT CONTRAST TECHNIQUE: Multidetector CT imaging of the head and cervical spine was performed following the standard protocol without intravenous contrast. Multiplanar CT image reconstructions of the cervical spine were also generated. COMPARISON:  None. July 08, 2014, April 25, 2013 FINDINGS: CT HEAD FINDINGS Brain: No evidence of acute infarction, hemorrhage, hydrocephalus, extra-axial collection or mass lesion/mass effect. There is chronic diffuse atrophy. Chronic bilateral periventricular white matter small vessel ischemic changes identified. Small low-density is identified in the left frontal white matter chronic unchanged compared prior exam in 2014. Small old lacunar infarctions in both basal ganglia are noted. Vascular: No hyperdense vessel or unexpected calcification. Skull: Normal. Negative for fracture or focal lesion. Sinuses/Orbits: No acute finding. Other: None. CT CERVICAL SPINE FINDINGS Alignment: Normal. Skull base and vertebrae: No acute fracture. No primary bone lesion or focal pathologic process. Soft tissues and spinal canal: No prevertebral fluid or swelling. No visible canal hematoma. Disc levels: Degenerative joint changes of the spine with narrowed joint space and osteophyte formation are identified throughout cervical spine. Upper chest: Biapical scar are noted. Other: None. IMPRESSION: No focal acute intracranial abnormality identified. No acute fracture or dislocation of cervical spine.  Electronically Signed   By: Abelardo Diesel M.D.   On: 04/29/2017 18:31    EKG: Independently reviewed. Nsr, no acute issues Old chart reviewed Case discussed with edp  Assessment/Plan 83 yo male with syncopal episode  Principal Problem:   Syncope- orthostatics only positive for increase in HR, bp stable.  Given a liter ivf in ED.  Romi.  Cardiac echo in am.  No focal neuro deficits, at baseline status.  Tele monitoring.  obs overnight.   Ct head neg  Active Problems:   Anxiety state- noted   Essential hypertension- cont home meds   Vascular dementia without behavioral disturbance- noted   Dizzy- noted    DVT prophylaxis:  scds Code Status:  full Family Communication:  none Disposition Plan:  Per day team Consults called:  none Admission status:  observation   DAVID,RACHAL A MD [facility]  If 7PM-7AM, please contact night-coverage [email or web address] Password  [email or web address]  04/29/2017, 10:46 PM

## 2017-04-29 NOTE — ED Notes (Signed)
Pt stood for orthostatic VS. Max assistance. Unsteady on feet. Both hands on chair to steady self. Pt reports headache. No dizziness. No light-headed.

## 2017-04-29 NOTE — ED Provider Notes (Signed)
[place] EMERGENCY DEPARTMENT Provider Note   CSN: [id number] Arrival date & time: 04/29/17  1711     History   Chief Complaint Chief Complaint  Patient presents with  . Fall    HPI Codylee Patil is a 83 y.o. male.  Level V caveat: Dementia   Colter Magowan is a 83 y.o. Male who presents to the ED from [place] after a fall in the bathroom today. I spoke with nursing staff who report they were called to the patient's room by his wife after she heard the patient fall in the bathroom.  They found him sleepy on the floor of the bathroom with an oxygen saturation of 78% on room air and a heart rate of 118.  They report he started to perk up after they arrived.  He has had no recent illness and no fevers recently.  He has a history of chronic headaches.  They report he is been acting at his baseline recently.  Patient only complains of a headache that is generalized. He reports he has had decreased appetite recently and has been eating and drinking less.   He denies any other complaints. He denies coughing, chest pain, shortness of breath, or abdominal pain.    The history is provided by the patient, medical records, the spouse and the EMS personnel. No language interpreter was used.  Fall  Associated symptoms include headaches. Pertinent negatives include no chest pain, no abdominal pain and no shortness of breath.    Past Medical History:  Diagnosis Date  . Brain tumor ([place])   . Chronic constipation   . Headache(784.0)   . Hypercalcemia   . Hypercholesteremia   . Hypertension   . Insomnia   . Irregular heartbeat     Patient Active Problem List   Diagnosis Date Noted  . Syncope 04/29/2017  . Dizzy 04/29/2017  . History of brain tumor 04/02/2017  . Chronic headaches 11/15/2016  . Vascular dementia without behavioral disturbance 08/17/2016  . Brain tumor ([place])   . Osteoporosis 07/01/2009  . PRIMARY HYPERPARATHYROIDISM 01/09/2009  . HYPERCALCEMIA  01/09/2009  . Anxiety state 01/09/2009  . Essential hypertension 01/09/2009    Past Surgical History:  Procedure Laterality Date  . BRAIN SURGERY     x3  . SHOULDER SURGERY     right       Home Medications    Prior to Admission medications   Medication Sig Start Date End Date Taking? Authorizing Provider  acetaminophen (TYLENOL) 325 MG tablet Take 650 mg by mouth daily.    Yes [provider]  acetaminophen (TYLENOL) 325 MG tablet Take 650 mg by mouth every 6 (six) hours as needed for headache.   Yes [provider]  donepezil (ARICEPT) 10 MG tablet Take 10 mg by mouth at bedtime.    Yes [provider]  HYDROcodone-acetaminophen (NORCO/VICODIN) 5-325 MG per tablet Take 1 tablet by mouth every 8 (eight) hours as needed for moderate pain. 04/25/13  Yes [name], MD  lisinopril-hydrochlorothiazide (PRINZIDE,ZESTORETIC) 20-25 MG per tablet Take 0.5 tablets by mouth daily.    Yes [provider]  memantine (NAMENDA XR) 28 MG CP24 24 hr capsule Take 28 mg by mouth daily.   Yes [provider]    Family History Family History  Problem Relation Age of Onset  . Appendicitis Father   . AAA (abdominal aortic aneurysm) Father     Social History Social History   Tobacco Use  . Smoking status:  Former Smoker    Years: 1.00    Types: Cigarettes    Last attempt to quit: 09/19/1976    Years since quitting: 40.6  . Smokeless tobacco: Never Used  Substance Use Topics  . Alcohol use: No  . Drug use: No     Allergies   Cephalexin   Review of Systems Review of Systems  Unable to perform ROS: Dementia  Constitutional: Negative for fever.  Respiratory: Negative for cough and shortness of breath.   Cardiovascular: Negative for chest pain.  Gastrointestinal: Negative for abdominal pain, nausea and vomiting.  Genitourinary: Negative for dysuria.  Neurological: Positive for headaches.     Physical Exam Updated Vital Signs BP  121/78   Pulse 82   Temp 97.6 F (36.4 C) (Oral)   Resp 16   Ht 5\' 6"  (1.676 m)   Wt 66.2 kg (146 lb)   SpO2 94%   BMI 23.57 kg/m   Physical Exam  Constitutional: He appears well-developed and well-nourished. No distress.  Nontoxic appearing.  In no apparent distress.  HENT:  Head: Normocephalic and atraumatic.  Right Ear: External ear normal.  Left Ear: External ear normal.  Mouth/Throat: Oropharynx is clear and moist.  Eyes: Conjunctivae are normal. Pupils are equal, round, and reactive to light. Right eye exhibits no discharge. Left eye exhibits no discharge.  Neck: Neck supple.  Cardiovascular: Normal rate, regular rhythm, normal heart sounds and intact distal pulses. Exam reveals no gallop and no friction rub.  No murmur heard. Pulmonary/Chest: Effort normal and breath sounds normal. No respiratory distress. He has no wheezes. He has no rales.  Lungs are clear to ascultation bilaterally. Symmetric chest expansion bilaterally. No increased work of breathing. No rales or rhonchi.    Abdominal: Soft. He exhibits no distension. There is no tenderness. There is no guarding.  Musculoskeletal: He exhibits no edema.  Lymphadenopathy:    He has no cervical adenopathy.  Neurological: He is alert. No cranial nerve deficit. Coordination normal.  Alert and oriented to person and place only.  Speech is clear and coherent.  Cranial nerves are intact.  Good strength and sensation to his bilateral upper and lower extremities.  Skin: Skin is warm and dry. No rash noted. He is not diaphoretic. No erythema. No pallor.  Psychiatric: He has a normal mood and affect. His behavior is normal.  Nursing note and vitals reviewed.    ED Treatments / Results  Labs (all labs ordered are listed, but only abnormal results are displayed) Labs Reviewed  CBC WITH DIFFERENTIAL/PLATELET - Abnormal; Notable for the following components:      Result Value   Neutro Abs 8.0 (*)    All other components within  normal limits  COMPREHENSIVE METABOLIC PANEL - Abnormal; Notable for the following components:   Glucose, Bld 126 (*)    BUN 22 (*)    Creatinine, Ser 1.66 (*)    Calcium 11.2 (*)    ALT 14 (*)    GFR calc non Af Amer 36 (*)    GFR calc Af Amer 41 (*)    All other components within normal limits  URINALYSIS, ROUTINE W REFLEX MICROSCOPIC  D-DIMER, QUANTITATIVE (NOT AT [facility])  TROPONIN I  TROPONIN I  TROPONIN I  BASIC METABOLIC PANEL  CBC    EKG  EKG Interpretation None       Radiology Dg Chest 2 View  Result Date: 04/29/2017 CLINICAL DATA:  Syncope EXAM: CHEST  2 VIEW COMPARISON:  02/09/2014 FINDINGS: Hypoventilation  with bibasilar atelectasis. Negative for heart failure. Negative for pneumonia or effusion. Atherosclerotic aortic arch. IMPRESSION: Mild bibasilar atelectasis. Electronically Signed   By: Franchot Gallo M.D.   On: 04/29/2017 18:51   Ct Head Wo Contrast  Result Date: 04/29/2017 CLINICAL DATA:  Status post fall. Patient has a history of brain surgery. EXAM: CT HEAD WITHOUT CONTRAST CT CERVICAL SPINE WITHOUT CONTRAST TECHNIQUE: Multidetector CT imaging of the head and cervical spine was performed following the standard protocol without intravenous contrast. Multiplanar CT image reconstructions of the cervical spine were also generated. COMPARISON:  None. July 08, 2014, April 25, 2013 FINDINGS: CT HEAD FINDINGS Brain: No evidence of acute infarction, hemorrhage, hydrocephalus, extra-axial collection or mass lesion/mass effect. There is chronic diffuse atrophy. Chronic bilateral periventricular white matter small vessel ischemic changes identified. Small low-density is identified in the left frontal white matter chronic unchanged compared prior exam in 2014. Small old lacunar infarctions in both basal ganglia are noted. Vascular: No hyperdense vessel or unexpected calcification. Skull: Normal. Negative for fracture or focal lesion. Sinuses/Orbits: No acute finding. Other:  None. CT CERVICAL SPINE FINDINGS Alignment: Normal. Skull base and vertebrae: No acute fracture. No primary bone lesion or focal pathologic process. Soft tissues and spinal canal: No prevertebral fluid or swelling. No visible canal hematoma. Disc levels: Degenerative joint changes of the spine with narrowed joint space and osteophyte formation are identified throughout cervical spine. Upper chest: Biapical scar are noted. Other: None. IMPRESSION: No focal acute intracranial abnormality identified. No acute fracture or dislocation of cervical spine. Electronically Signed   By: Abelardo Diesel M.D.   On: 04/29/2017 18:31   Ct Cervical Spine Wo Contrast  Result Date: 04/29/2017 CLINICAL DATA:  Status post fall. Patient has a history of brain surgery. EXAM: CT HEAD WITHOUT CONTRAST CT CERVICAL SPINE WITHOUT CONTRAST TECHNIQUE: Multidetector CT imaging of the head and cervical spine was performed following the standard protocol without intravenous contrast. Multiplanar CT image reconstructions of the cervical spine were also generated. COMPARISON:  None. July 08, 2014, April 25, 2013 FINDINGS: CT HEAD FINDINGS Brain: No evidence of acute infarction, hemorrhage, hydrocephalus, extra-axial collection or mass lesion/mass effect. There is chronic diffuse atrophy. Chronic bilateral periventricular white matter small vessel ischemic changes identified. Small low-density is identified in the left frontal white matter chronic unchanged compared prior exam in 2014. Small old lacunar infarctions in both basal ganglia are noted. Vascular: No hyperdense vessel or unexpected calcification. Skull: Normal. Negative for fracture or focal lesion. Sinuses/Orbits: No acute finding. Other: None. CT CERVICAL SPINE FINDINGS Alignment: Normal. Skull base and vertebrae: No acute fracture. No primary bone lesion or focal pathologic process. Soft tissues and spinal canal: No prevertebral fluid or swelling. No visible canal hematoma. Disc  levels: Degenerative joint changes of the spine with narrowed joint space and osteophyte formation are identified throughout cervical spine. Upper chest: Biapical scar are noted. Other: None. IMPRESSION: No focal acute intracranial abnormality identified. No acute fracture or dislocation of cervical spine. Electronically Signed   By: Abelardo Diesel M.D.   On: 04/29/2017 18:31    Procedures Procedures (including critical care time)  Medications Ordered in ED Medications  HYDROcodone-acetaminophen (NORCO/VICODIN) 5-325 MG per tablet 1 tablet (not administered)  donepezil (ARICEPT) tablet 10 mg (not administered)  acetaminophen (TYLENOL) tablet 650 mg (not administered)  memantine (NAMENDA XR) 24 hr capsule 28 mg (not administered)  acetaminophen (TYLENOL) tablet 650 mg (not administered)  sodium chloride flush (NS) 0.9 % injection 3 mL (not administered)  sodium chloride flush (NS) 0.9 % injection 3 mL (not administered)  sodium chloride flush (NS) 0.9 % injection 3 mL (not administered)  0.9 %  sodium chloride infusion (not administered)  lisinopril (PRINIVIL,ZESTRIL) tablet 10 mg (not administered)    And  hydrochlorothiazide (MICROZIDE) capsule 12.5 mg (not administered)  sodium chloride 0.9 % bolus 1,000 mL (1,000 mLs Intravenous New Bag/Given 04/29/17 2217)     Initial Impression / Assessment and Plan / ED Course  I have reviewed the triage vital signs and the nursing notes.  Pertinent labs & imaging results that were available during my care of the patient were reviewed by me and considered in my medical decision making (see chart for details).    This is a 83 y.o. Male who presents to the ED from [place] after a fall in the bathroom today. I spoke with nursing staff who report they were called to the patient's room by his wife after she heard the patient fall in the bathroom.  They found him sleepy on the floor of the bathroom with an oxygen saturation of 78% on room air and a  heart rate of 118.  They report he started to perk up after they arrived.  He has had no recent illness and no fevers recently.  He has a history of chronic headaches.  They report he is been acting at his baseline recently.  Patient only complains of a headache that is generalized. He reports he has had decreased appetite recently and has been eating and drinking less.  On exam the patient is afebrile nontoxic-appearing.  He is alert and oriented to person and place only.  His speech is clear and coherent.  Patient is spontaneously moving all extremities in a coordinated fashion exhibiting good strength. Lungs CTA.  Abdomen is soft and nontender.  No visible signs of injury or trauma. CMP reveals a creatinine above his baseline 1.66.  CBC is unremarkable.  Urinalysis without sign of infection. Chest x-ray shows mild bibasilar atelectasis.  CT head and cervical spine is unremarkable. EKG shows NSR.  Patient is orthostatic on exam.  His heart rate goes from 90-133 with standing.  He feels lightheaded and dizzy with standing.  Will provide with fluid bolus and plan for admission with his orthostatic syncope.  I consulted with Dr. Shanon Brow from [facility] who accepted the patient for admission.   This patient was discussed with and evaluated by Dr. Ellender Hose who agrees with assessment and plan.   Final Clinical Impressions(s) / ED Diagnoses   Final diagnoses:  Orthostatic hypotension  Syncope and collapse    ED Discharge Orders    None       Waynetta Pean, PA-C 04/30/17 Napoleon Form, MD 04/30/17 1153

## 2017-04-30 ENCOUNTER — Encounter (HOSPITAL_COMMUNITY): Payer: Self-pay | Admitting: *Deleted

## 2017-04-30 ENCOUNTER — Other Ambulatory Visit: Payer: Self-pay

## 2017-04-30 DIAGNOSIS — E210 Primary hyperparathyroidism: Secondary | ICD-10-CM

## 2017-04-30 DIAGNOSIS — I10 Essential (primary) hypertension: Secondary | ICD-10-CM

## 2017-04-30 DIAGNOSIS — F411 Generalized anxiety disorder: Secondary | ICD-10-CM | POA: Diagnosis present

## 2017-04-30 DIAGNOSIS — E861 Hypovolemia: Secondary | ICD-10-CM | POA: Diagnosis present

## 2017-04-30 DIAGNOSIS — Z87891 Personal history of nicotine dependence: Secondary | ICD-10-CM | POA: Diagnosis not present

## 2017-04-30 DIAGNOSIS — I951 Orthostatic hypotension: Secondary | ICD-10-CM | POA: Insufficient documentation

## 2017-04-30 DIAGNOSIS — E785 Hyperlipidemia, unspecified: Secondary | ICD-10-CM | POA: Diagnosis present

## 2017-04-30 DIAGNOSIS — R55 Syncope and collapse: Secondary | ICD-10-CM | POA: Insufficient documentation

## 2017-04-30 DIAGNOSIS — I499 Cardiac arrhythmia, unspecified: Secondary | ICD-10-CM | POA: Diagnosis not present

## 2017-04-30 DIAGNOSIS — F0150 Vascular dementia without behavioral disturbance: Secondary | ICD-10-CM | POA: Diagnosis not present

## 2017-04-30 DIAGNOSIS — E7800 Pure hypercholesterolemia, unspecified: Secondary | ICD-10-CM | POA: Diagnosis present

## 2017-04-30 LAB — BASIC METABOLIC PANEL
ANION GAP: 9 (ref 5–15)
BUN: 24 mg/dL — ABNORMAL HIGH (ref 6–20)
CALCIUM: 10.9 mg/dL — AB (ref 8.9–10.3)
CO2: 22 mmol/L (ref 22–32)
Chloride: 106 mmol/L (ref 101–111)
Creatinine, Ser: 1.51 mg/dL — ABNORMAL HIGH (ref 0.61–1.24)
GFR calc Af Amer: 46 mL/min — ABNORMAL LOW (ref >60)
GFR, EST NON AFRICAN AMERICAN: 40 mL/min — AB (ref >60)
GLUCOSE: 90 mg/dL (ref 65–99)
Potassium: 4.7 mmol/L (ref 3.5–5.1)
Sodium: 137 mmol/L (ref 135–145)

## 2017-04-30 LAB — CBC
HCT: 39.5 % (ref 39.0–52.0)
HEMOGLOBIN: 13.2 g/dL (ref 13.0–17.0)
MCH: 31.7 pg (ref 26.0–34.0)
MCHC: 33.4 g/dL (ref 30.0–36.0)
MCV: 94.7 fL (ref 78.0–100.0)
PLATELETS: 219 10*3/uL (ref 150–400)
RBC: 4.17 MIL/uL — ABNORMAL LOW (ref 4.22–5.81)
RDW: 12.3 % (ref 11.5–15.5)
WBC: 6.7 10*3/uL (ref 4.0–10.5)

## 2017-04-30 LAB — D-DIMER, QUANTITATIVE: D-Dimer, Quant: 0.48 ug/mL-FEU (ref 0.00–0.50)

## 2017-04-30 LAB — CBG MONITORING, ED: GLUCOSE-CAPILLARY: 115 mg/dL — AB (ref 65–99)

## 2017-04-30 LAB — TROPONIN I: Troponin I: <0.03 ng/mL (ref <0.03)

## 2017-04-30 MED ORDER — INFLUENZA VAC SPLIT HIGH-DOSE 0.5 ML IM SUSY
0.5000 mL | PREFILLED_SYRINGE | INTRAMUSCULAR | Status: DC
  Filled 2017-04-30: qty 0.5

## 2017-04-30 MED ORDER — SODIUM CHLORIDE 0.9 % IV SOLN
INTRAVENOUS | Status: AC
  Administered 2017-04-30 (×2): via INTRAVENOUS

## 2017-04-30 NOTE — Evaluation (Signed)
Physical Therapy Evaluation Patient Details Name: Richard Mack MRN: [id number] DOB: [birth year] Today's Date: 04/30/2017   History of Present Illness  Pt is an 83 y.o. male with medical history significant of dementia, HTN, and brain tumor. He presented to the ED from [facility] after being found down in bathroom likely after a syncopal episode.   Clinical Impression  Pt admitted with above diagnosis. Pt currently with functional limitations due to the deficits listed below (see PT Problem List). PTA pt resided at [facility]. Mobility history limited due to no family available and pt has dementia. On eval, pt required supervision bed mobility, min guard assist transfers and min guard +1 HHA ambulation 50 feet. I suspect pt is at/near his baseline for mobility.  Pt is at risk for falls due to dementia, decreased safety awareness, weakness, and balance deficits. He would benefit from the use of RW for ambulation, if he is agreeable. Pt will benefit from skilled PT to increase their independence and safety with mobility to allow discharge to the venue listed below.       Follow Up Recommendations SNF    Equipment Recommendations  Rolling walker with 5" wheels    Recommendations for Other Services       Precautions / Restrictions Precautions Precautions: Fall Restrictions Other Position/Activity Restrictions: BP EOB 138/74      Mobility  Bed Mobility Overal bed mobility: Needs Assistance Bed Mobility: Supine to Sit;Sit to Supine     Supine to sit: Supervision Sit to supine: Supervision   General bed mobility comments: supervision for safety, increased time and effort  Transfers Overall transfer level: Needs assistance Equipment used: None Transfers: Sit to/from Stand;Stand Pivot Transfers   Stand pivot transfers: Min guard       General transfer comment: min guard for safety, increased time to stabilize initial standing  balance  Ambulation/Gait Ambulation/Gait assistance: Min guard Ambulation Distance (Feet): 50 Feet Assistive device: 1 person hand held assist Gait Pattern/deviations: Step-through pattern;Decreased stride length;Shuffle Gait velocity: decreased Gait velocity interpretation: <1.8 ft/sec, indicative of risk for recurrent falls General Gait Details: Pt declined ambulation outside of room due to not having on pants.  Stairs            Wheelchair Mobility    Modified Rankin (Stroke Patients Only)       Balance Overall balance assessment: Needs assistance Sitting-balance support: No upper extremity supported;Feet supported Sitting balance-Leahy Scale: Good     Standing balance support: Single extremity supported;During functional activity Standing balance-Leahy Scale: Fair Standing balance comment: no overt LOB during mobility; slow, guarded gait with +1 HHA                             Pertinent Vitals/Pain Pain Assessment: No/denies pain    Home Living Family/patient expects to be discharged to:: Skilled nursing facility                      Prior Function Level of Independence: Needs assistance   Gait / Transfers Assistance Needed: per pt, ambulates without AD  ADL's / Homemaking Assistance Needed: staff assists with ADLs  Comments: History limited due to no family present and pt has dementia.     Hand Dominance        Extremity/Trunk Assessment   Upper Extremity Assessment Upper Extremity Assessment: Generalized weakness    Lower Extremity Assessment Lower Extremity Assessment: Generalized weakness    Cervical /  Trunk Assessment Cervical / Trunk Assessment: Kyphotic  Communication   Communication: HOH  Cognition Arousal/Alertness: Awake/alert Behavior During Therapy: WFL for tasks assessed/performed Overall Cognitive Status: History of cognitive impairments - at baseline                                         General Comments General comments (skin integrity, edema, etc.): VSS    Exercises     Assessment/Plan    PT Assessment Patient needs continued PT services  PT Problem List Decreased strength;Decreased knowledge of use of DME;Decreased activity tolerance;Decreased safety awareness;Decreased balance;Decreased mobility;Decreased knowledge of precautions       PT Treatment Interventions DME instruction;Balance training;Gait training;Functional mobility training;Patient/family education;Therapeutic activities;Therapeutic exercise    PT Goals (Current goals can be found in the Care Plan section)  Acute Rehab PT Goals Patient Stated Goal: home PT Goal Formulation: Patient unable to participate in goal setting Time For Goal Achievement: 05/14/17 Potential to Achieve Goals: Good    Frequency Min 2X/week   Barriers to discharge        Co-evaluation               AM-PAC PT "6 Clicks" Daily Activity  Outcome Measure Difficulty turning over in bed (including adjusting bedclothes, sheets and blankets)?: None Difficulty moving from lying on back to sitting on the side of the bed? : A Little Difficulty sitting down on and standing up from a chair with arms (e.g., wheelchair, bedside commode, etc,.)?: A Lot Help needed moving to and from a bed to chair (including a wheelchair)?: A Little Help needed walking in hospital room?: A Little Help needed climbing 3-5 steps with a railing? : A Little 6 Click Score: 18    End of Session Equipment Utilized During Treatment: Gait belt Activity Tolerance: Patient tolerated treatment well Patient left: in bed;with call bell/phone within reach Nurse Communication: Mobility status PT Visit Diagnosis: Unsteadiness on feet (R26.81);History of falling (Z91.81)    Time: 2836-6294 PT Time Calculation (min) (ACUTE ONLY): 12 min   Charges:   PT Evaluation $PT Eval Moderate Complexity: 1 Mod     PT G Codes:   PT G-Codes **NOT FOR INPATIENT  CLASS** Functional Assessment Tool Used: AM-PAC 6 Clicks Basic Mobility Functional Limitation: Mobility: Walking and moving around Mobility: Walking and Moving Around Current Status (T6546): At least 40 percent but less than 60 percent impaired, limited or restricted Mobility: Walking and Moving Around Goal Status [phone]): At least 20 percent but less than 40 percent impaired, limited or restricted    Lorrin Goodell, PT  Office # [phone] Pager [phone]   Lorriane Shire 04/30/2017, 12:39 PM

## 2017-04-30 NOTE — ED Notes (Signed)
Walked in room to find pt standing at the foot of the bed holding onto the rail attempting to urinate. Assisted pt back into the bed. Pt does have fall band and socks in place.

## 2017-04-30 NOTE — ED Notes (Signed)
Pt reports hx of right arm weakness from shoulder surgery.

## 2017-04-30 NOTE — ED Notes (Signed)
Heart Healthy diet lunch tray ordered @ 1022.

## 2017-04-30 NOTE — ED Notes (Signed)
Heart Healthy diet breakfast tray ordered @ 0401.

## 2017-04-30 NOTE — Progress Notes (Signed)
PROGRESS NOTE  Richard Mack IWP:[id number] DOB: [birth year] DOA: 04/29/2017 PCP: Antony Contras, MD  Brief History:  83 year old male with a history of hypercalcemia, hyperlipidemia, hypertension, dementia presenting with syncope.  The patient was found down on the floor in the bathroom by nursing home staff.  The patient states that  he had just finished urinating and was turning around to leave the bathroom when he felt dizzy and lost consciousness.  He states that this has never occurred before.  However, he states that he has had intermittent dizziness for "many months" particularly when going from a seated position to standing.  Patient denies fevers, chills, headache, chest pain, dyspnea, nausea, vomiting, diarrhea, abdominal pain, dysuria, hematuria, hematochezia, and melena.  In the emergency department, the patient was noted to be afebrile hemodynamically stable saturating 100% on room air.  Troponins were negative x2.   Assessment/Plan: Syncope -Secondary to vagal reaction (?micturiction syncope) and hypovolemia -The patient has had standing history of hypercalcemia which is likely contributing to solute diuresis and hypovolemia -Continue IV fluids  -orthostatic vital signs positive--with HR going 94>>>123 -Echocardiogram -monitor on telemetry -personally reviewed EKG--sinus, nonspecific T wave changes -CT brain neg -CT-C-spine--neg -personally reviewed CXR--basilar atelectasis  Hypercalcemia/primary hyperparathyroidism -The patient likely has primary hyperparathyroidism -Review of the medical record shows previous intact PTH 174.8 -Outpatient endocrinology eval -recheck PTH -corrected calcium 11.2 at time of admission  Essential hypertension -Holding lisinopril and HCTZ secondary to soft blood pressure and volume depletion -am BMP  Dementia without behavioral disturbance  -continue Aricept and Namenda     Disposition Plan:   SNF 1-2 days  Family  Communication:   No Family at bedside  Consultants:  none  Code Status:  FULL   DVT Prophylaxis:  SCDs   Procedures: As Listed in Progress Note Above  Antibiotics: None    Subjective: Patient denies fevers, chills, headache, chest pain, dyspnea, nausea, vomiting, diarrhea, abdominal pain, dysuria, hematuria, hematochezia, and melena.   Objective: Vitals:   04/30/17 0600 04/30/17 0630 04/30/17 0645 04/30/17 0800  BP: 121/69 115/68 125/67 124/75  Pulse: 88 82  94  Resp:    14  Temp:      TempSrc:      SpO2: 95% 96%  97%  Weight:      Height:        Intake/Output Summary (Last 24 hours) at 04/30/2017 0900 Last data filed at 04/30/2017 0104 Gross per 24 hour  Intake 1000 ml  Output -  Net 1000 ml   Weight change:  Exam:   General:  Pt is alert, follows commands appropriately, not in acute distress  HEENT: No icterus, No thrush, No neck mass, [place]/AT  Cardiovascular: RRR, S1/S2, no rubs, no gallops  Respiratory: CTA bilaterally, no wheezing, no crackles, no rhonchi  Abdomen: Soft/+BS, non tender, non distended, no guarding  Extremities: No edema, No lymphangitis, No petechiae, No rashes, no synovitis   Data Reviewed: I have personally reviewed following labs and imaging studies Basic Metabolic Panel: Recent Labs  Lab 04/29/17 1944 04/30/17 0528  NA 137 137  K 4.6 4.7  CL 104 106  CO2 23 22  GLUCOSE 126* 90  BUN 22* 24*  CREATININE 1.66* 1.51*  CALCIUM 11.2* 10.9*   Liver Function Tests: Recent Labs  Lab 04/29/17 1944  AST 19  ALT 14*  ALKPHOS 113  BILITOT 0.6  PROT 6.8  ALBUMIN 3.8   No results for input(s): LIPASE, AMYLASE in  the last 168 hours. No results for input(s): AMMONIA in the last 168 hours. Coagulation Profile: No results for input(s): INR, PROTIME in the last 168 hours. CBC: Recent Labs  Lab 04/29/17 1944 04/30/17 0528  WBC 9.3 6.7  NEUTROABS 8.0*  --   HGB 14.1 13.2  HCT 42.1 39.5  MCV 94.6 94.7  PLT 210 219    Cardiac Enzymes: Recent Labs  Lab 04/30/17 0001 04/30/17 0528  TROPONINI <0.03 <0.03   BNP: Invalid input(s): POCBNP CBG: Recent Labs  Lab 04/30/17 0555  GLUCAP 115*   HbA1C: No results for input(s): HGBA1C in the last 72 hours. Urine analysis:    Component Value Date/Time   COLORURINE YELLOW 04/29/2017 2202   APPEARANCEUR CLEAR 04/29/2017 2202   LABSPEC 1.018 04/29/2017 2202   PHURINE 5.0 04/29/2017 2202   GLUCOSEU NEGATIVE 04/29/2017 2202   HGBUR NEGATIVE 04/29/2017 2202   BILIRUBINUR NEGATIVE 04/29/2017 2202   KETONESUR NEGATIVE 04/29/2017 2202   PROTEINUR NEGATIVE 04/29/2017 2202   UROBILINOGEN 1.0 02/09/2014 0850   NITRITE NEGATIVE 04/29/2017 2202   LEUKOCYTESUR NEGATIVE 04/29/2017 2202   Sepsis Labs: @LABRCNTIP (procalcitonin:4,lacticidven:4) )No results found for this or any previous visit (from the past 240 hour(s)).   Scheduled Meds: . acetaminophen  650 mg Oral Daily  . donepezil  10 mg Oral QHS  . memantine  28 mg Oral Daily  . sodium chloride flush  3 mL Intravenous Q12H  . sodium chloride flush  3 mL Intravenous Q12H   Continuous Infusions: . sodium chloride      Procedures/Studies: Dg Chest 2 View  Result Date: 04/29/2017 CLINICAL DATA:  Syncope EXAM: CHEST  2 VIEW COMPARISON:  02/09/2014 FINDINGS: Hypoventilation with bibasilar atelectasis. Negative for heart failure. Negative for pneumonia or effusion. Atherosclerotic aortic arch. IMPRESSION: Mild bibasilar atelectasis. Electronically Signed   By: Franchot Gallo M.D.   On: 04/29/2017 18:51   Ct Head Wo Contrast  Result Date: 04/29/2017 CLINICAL DATA:  Status post fall. Patient has a history of brain surgery. EXAM: CT HEAD WITHOUT CONTRAST CT CERVICAL SPINE WITHOUT CONTRAST TECHNIQUE: Multidetector CT imaging of the head and cervical spine was performed following the standard protocol without intravenous contrast. Multiplanar CT image reconstructions of the cervical spine were also generated.  COMPARISON:  None. July 08, 2014, April 25, 2013 FINDINGS: CT HEAD FINDINGS Brain: No evidence of acute infarction, hemorrhage, hydrocephalus, extra-axial collection or mass lesion/mass effect. There is chronic diffuse atrophy. Chronic bilateral periventricular white matter small vessel ischemic changes identified. Small low-density is identified in the left frontal white matter chronic unchanged compared prior exam in 2014. Small old lacunar infarctions in both basal ganglia are noted. Vascular: No hyperdense vessel or unexpected calcification. Skull: Normal. Negative for fracture or focal lesion. Sinuses/Orbits: No acute finding. Other: None. CT CERVICAL SPINE FINDINGS Alignment: Normal. Skull base and vertebrae: No acute fracture. No primary bone lesion or focal pathologic process. Soft tissues and spinal canal: No prevertebral fluid or swelling. No visible canal hematoma. Disc levels: Degenerative joint changes of the spine with narrowed joint space and osteophyte formation are identified throughout cervical spine. Upper chest: Biapical scar are noted. Other: None. IMPRESSION: No focal acute intracranial abnormality identified. No acute fracture or dislocation of cervical spine. Electronically Signed   By: Abelardo Diesel M.D.   On: 04/29/2017 18:31   Ct Cervical Spine Wo Contrast  Result Date: 04/29/2017 CLINICAL DATA:  Status post fall. Patient has a history of brain surgery. EXAM: CT HEAD WITHOUT CONTRAST CT CERVICAL SPINE  WITHOUT CONTRAST TECHNIQUE: Multidetector CT imaging of the head and cervical spine was performed following the standard protocol without intravenous contrast. Multiplanar CT image reconstructions of the cervical spine were also generated. COMPARISON:  None. July 08, 2014, April 25, 2013 FINDINGS: CT HEAD FINDINGS Brain: No evidence of acute infarction, hemorrhage, hydrocephalus, extra-axial collection or mass lesion/mass effect. There is chronic diffuse atrophy. Chronic bilateral  periventricular white matter small vessel ischemic changes identified. Small low-density is identified in the left frontal white matter chronic unchanged compared prior exam in 2014. Small old lacunar infarctions in both basal ganglia are noted. Vascular: No hyperdense vessel or unexpected calcification. Skull: Normal. Negative for fracture or focal lesion. Sinuses/Orbits: No acute finding. Other: None. CT CERVICAL SPINE FINDINGS Alignment: Normal. Skull base and vertebrae: No acute fracture. No primary bone lesion or focal pathologic process. Soft tissues and spinal canal: No prevertebral fluid or swelling. No visible canal hematoma. Disc levels: Degenerative joint changes of the spine with narrowed joint space and osteophyte formation are identified throughout cervical spine. Upper chest: Biapical scar are noted. Other: None. IMPRESSION: No focal acute intracranial abnormality identified. No acute fracture or dislocation of cervical spine. Electronically Signed   By: Abelardo Diesel M.D.   On: 04/29/2017 18:31    Orson Eva, DO  [facility] Pager [phone]  If 7PM-7AM, please contact night-coverage [email or web address] Password [email or web address] 04/30/2017, 9:00 AM   LOS: 0 days

## 2017-04-30 NOTE — ED Notes (Signed)
[name], stepdaughter: [phone]

## 2017-04-30 NOTE — ED Notes (Signed)
Admitting MD at bedside.

## 2017-05-01 ENCOUNTER — Inpatient Hospital Stay (HOSPITAL_COMMUNITY): Payer: Medicare Other

## 2017-05-01 DIAGNOSIS — R55 Syncope and collapse: Secondary | ICD-10-CM

## 2017-05-01 LAB — ECHOCARDIOGRAM COMPLETE
HEIGHTINCHES: 66 in
WEIGHTICAEL: 2486.79 [oz_av]

## 2017-05-01 LAB — BASIC METABOLIC PANEL
ANION GAP: 7 (ref 5–15)
BUN: 17 mg/dL (ref 6–20)
CALCIUM: 10.8 mg/dL — AB (ref 8.9–10.3)
CO2: 23 mmol/L (ref 22–32)
Chloride: 105 mmol/L (ref 101–111)
Creatinine, Ser: 1.24 mg/dL (ref 0.61–1.24)
GFR calc non Af Amer: 51 mL/min — ABNORMAL LOW (ref >60)
GFR, EST AFRICAN AMERICAN: 59 mL/min — AB (ref >60)
Glucose, Bld: 91 mg/dL (ref 65–99)
POTASSIUM: 4.1 mmol/L (ref 3.5–5.1)
Sodium: 135 mmol/L (ref 135–145)

## 2017-05-01 LAB — PARATHYROID HORMONE, INTACT (NO CA): PTH: 112 pg/mL — ABNORMAL HIGH (ref 15–65)

## 2017-05-01 LAB — GLUCOSE, CAPILLARY: Glucose-Capillary: 88 mg/dL (ref 65–99)

## 2017-05-01 MED ORDER — HYDROCODONE-ACETAMINOPHEN 5-325 MG PO TABS: 1.0000 | ORAL_TABLET | Freq: Three times a day (TID) | ORAL | 0 refills | Status: DC | PRN

## 2017-05-01 NOTE — Clinical Social Work Note (Addendum)
Clinical Social Work Assessment  Patient Details  Name: Richard Mack MRN: [id number] Date of Birth: Jan 21, 1929  Date of referral:  05/01/17               Reason for consult:  Facility Placement                Permission sought to share information with:  Family Supports Permission granted to share information::     Name::     Peter Congo  Agency::     Relationship::  Step daughter  Contact Information:  [phone]  Housing/Transportation Living arrangements for the past 2 months:  [place] of Information:  Adult Children Patient Interpreter Needed:  None Criminal Activity/Legal Involvement Pertinent to Current Situation/Hospitalization:  No - Comment as needed Significant Relationships:  Adult Children, Spouse Lives with:  Facility Resident Do you feel safe going back to the place where you live?  Yes Need for family participation in patient care:     Care giving concerns:  CSW spoke with pt's step daughter via telephone. Pt is a long term resident from [facility].   Social Worker assessment / plan:  Pt is a long term resident at [facility]. Pt will return at d/c. CSW contacted facility and set up transport.  Employment status:  Retired Forensic scientist:  Medicare PT Recommendations:  [place] / Referral to community resources:  [place]  Patient/Family's Response to care:  Pt's step daughter verbalized understanding of CSW role and expressed appreciation for support. Pt's steo daughter denies any concern regarding pt care at this time.   Patient/Family's Understanding of and Emotional Response to Diagnosis, Current Treatment, and Prognosis:  Pt's step daughter understanding and realistic regarding pt's physical limitations. Pt's step daughter understands the need for pt SNF placement at d/c. Pt;'s step daughter agreeable to SNF placement for pt at d/c, at this time. Pt's step daughter responses emotionally  appropriate during conversation with CSW. Pt's step daughter denies any concern regarding pt's treatment plan at this time. CSW will continue to provide support and facilitate d/c needs.   Emotional Assessment Appearance:  Appears stated age Attitude/Demeanor/Rapport:  Unable to Assess Affect (typically observed):  Unable to Assess Orientation:  Oriented to Self, Oriented to Place Alcohol / Substance use:  Not Applicable Psych involvement (Current and /or in the community):  No (Comment)  Discharge Needs  Concerns to be addressed:  No discharge needs identified Readmission within the last 30 days:  No Current discharge risk:  Dependent with Mobility Barriers to Discharge:  No Barriers Identified   [place], LCSW 05/01/2017, 3:03 PM

## 2017-05-01 NOTE — Discharge Summary (Signed)
Physician Discharge Summary  Richard Mack HUT:[id number] DOB: 27-Nov-1928 DOA: 04/29/2017  PCP: Gildardo Cranker, DO  Admit date: 04/29/2017 Discharge date: 05/01/2017  Admitted From: SNF Disposition:  SNF  Recommendations for Outpatient Follow-up:  1. Follow up with PCP in 1-2 weeks 2. Please obtain BMP/CBC in one week 3. Please follow up on 25 vitamin D, urine calcium/creatinine ratio 4. Please schedule patient to see Endocrinology     Discharge Condition: Stable CODE STATUS: FULL Diet recommendation: Heart Healthy    Brief/Interim Summary: 83 year old male with a history of hypercalcemia, hyperlipidemia, hypertension, dementia presenting with syncope.  The patient was found down on the floor in the bathroom by nursing home staff.  It was unclear how long he was down, but the patient feels that it was only "a minute." The patient states that  he had just finished urinating and was turning around to leave the bathroom when he felt dizzy and lost consciousness.  He states that this has never occurred before.  He denied any bowel or bladder incontinence or biting his tongue. However, he states that he has had intermittent dizziness for "many months" particularly when going from a seated position to standing.  Patient denies fevers, chills, headache, chest pain, dyspnea, nausea, vomiting, diarrhea, abdominal pain, dysuria, hematuria, hematochezia, and melena.  In the emergency department, the patient was noted to be afebrile hemodynamically stable saturating 100% on room air.  Troponins were negative x2.    Discharge Diagnoses:  Syncope -Likely Secondary to vagal reaction (?micturiction syncope) and hypovolemia -The patient has had standing history of hypercalcemia which is likely contributing to solute diuresis and hypovolemia -Continue IV fluids during the hospitalization  -Initial orthostatic vital signs positive--with HR going 94>>>123 -12/30 repeat orthostatics on day of d/c  negative---supine 152/71 HR 82; sitting 140/75 HR 87; stand 143/79 HR 94 -symptomatically improved without any further dizziness -Echocardiogram--EF 55-60%, grade 1 DD, no WMA, trivial AI -monitor on telemetry--no concerning dysrhythmia during hospitalization -personally reviewed EKG--sinus, nonspecific T wave changes -CT brain neg -CT-C-spine--neg -personally reviewed CXR--basilar atelectasis  -encouraged increase fluid intake  Hypercalcemia/primary hyperparathyroidism -The patient likely has primary hyperparathyroidism -Review of the medical record shows previous intact PTH 174.8 -discontinue HCTZ as this contributes to elevated Calcium -Outpatient endocrinology eval -recheck PTH--results pending at time of admisson -corrected calcium 11.2 at time of admission -checked 25 vitamin D--pending at time of d/c -checked urine calcium/creatinine ratio--pending at time of d/c  Essential hypertension -Holding lisinopril and HCTZ secondary to soft blood pressure initally and volume depletion -will not restart as BP remained well controlled during hospitalization  Dementia without behavioral disturbance  -continue Aricept and Namenda       Discharge Instructions   Allergies as of 05/01/2017      Reactions   Cephalexin Nausea And Vomiting      Medication List    STOP taking these medications   lisinopril-hydrochlorothiazide 20-25 MG tablet Commonly known as:  PRINZIDE,ZESTORETIC     TAKE these medications   acetaminophen 325 MG tablet Commonly known as:  TYLENOL Take 650 mg by mouth daily.   acetaminophen 325 MG tablet Commonly known as:  TYLENOL Take 650 mg by mouth every 6 (six) hours as needed for headache.   donepezil 10 MG tablet Commonly known as:  ARICEPT Take 10 mg by mouth at bedtime.   HYDROcodone-acetaminophen 5-325 MG tablet Commonly known as:  NORCO/VICODIN Take 1 tablet by mouth every 8 (eight) hours as needed for moderate pain.   NAMENDA XR 28  MG Cp24 24 hr capsule Generic drug:  memantine Take 28 mg by mouth daily.       Allergies  Allergen Reactions  . Cephalexin Nausea And Vomiting    Consultations:  none   Procedures/Studies: Dg Chest 2 View  Result Date: 04/29/2017 CLINICAL DATA:  Syncope EXAM: CHEST  2 VIEW COMPARISON:  02/09/2014 FINDINGS: Hypoventilation with bibasilar atelectasis. Negative for heart failure. Negative for pneumonia or effusion. Atherosclerotic aortic arch. IMPRESSION: Mild bibasilar atelectasis. Electronically Signed   By: Franchot Gallo M.D.   On: 04/29/2017 18:51   Ct Head Wo Contrast  Result Date: 04/29/2017 CLINICAL DATA:  Status post fall. Patient has a history of brain surgery. EXAM: CT HEAD WITHOUT CONTRAST CT CERVICAL SPINE WITHOUT CONTRAST TECHNIQUE: Multidetector CT imaging of the head and cervical spine was performed following the standard protocol without intravenous contrast. Multiplanar CT image reconstructions of the cervical spine were also generated. COMPARISON:  None. July 08, 2014, April 25, 2013 FINDINGS: CT HEAD FINDINGS Brain: No evidence of acute infarction, hemorrhage, hydrocephalus, extra-axial collection or mass lesion/mass effect. There is chronic diffuse atrophy. Chronic bilateral periventricular white matter small vessel ischemic changes identified. Small low-density is identified in the left frontal white matter chronic unchanged compared prior exam in 2014. Small old lacunar infarctions in both basal ganglia are noted. Vascular: No hyperdense vessel or unexpected calcification. Skull: Normal. Negative for fracture or focal lesion. Sinuses/Orbits: No acute finding. Other: None. CT CERVICAL SPINE FINDINGS Alignment: Normal. Skull base and vertebrae: No acute fracture. No primary bone lesion or focal pathologic process. Soft tissues and spinal canal: No prevertebral fluid or swelling. No visible canal hematoma. Disc levels: Degenerative joint changes of the spine with  narrowed joint space and osteophyte formation are identified throughout cervical spine. Upper chest: Biapical scar are noted. Other: None. IMPRESSION: No focal acute intracranial abnormality identified. No acute fracture or dislocation of cervical spine. Electronically Signed   By: Abelardo Diesel M.D.   On: 04/29/2017 18:31   Ct Cervical Spine Wo Contrast  Result Date: 04/29/2017 CLINICAL DATA:  Status post fall. Patient has a history of brain surgery. EXAM: CT HEAD WITHOUT CONTRAST CT CERVICAL SPINE WITHOUT CONTRAST TECHNIQUE: Multidetector CT imaging of the head and cervical spine was performed following the standard protocol without intravenous contrast. Multiplanar CT image reconstructions of the cervical spine were also generated. COMPARISON:  None. July 08, 2014, April 25, 2013 FINDINGS: CT HEAD FINDINGS Brain: No evidence of acute infarction, hemorrhage, hydrocephalus, extra-axial collection or mass lesion/mass effect. There is chronic diffuse atrophy. Chronic bilateral periventricular white matter small vessel ischemic changes identified. Small low-density is identified in the left frontal white matter chronic unchanged compared prior exam in 2014. Small old lacunar infarctions in both basal ganglia are noted. Vascular: No hyperdense vessel or unexpected calcification. Skull: Normal. Negative for fracture or focal lesion. Sinuses/Orbits: No acute finding. Other: None. CT CERVICAL SPINE FINDINGS Alignment: Normal. Skull base and vertebrae: No acute fracture. No primary bone lesion or focal pathologic process. Soft tissues and spinal canal: No prevertebral fluid or swelling. No visible canal hematoma. Disc levels: Degenerative joint changes of the spine with narrowed joint space and osteophyte formation are identified throughout cervical spine. Upper chest: Biapical scar are noted. Other: None. IMPRESSION: No focal acute intracranial abnormality identified. No acute fracture or dislocation of cervical  spine. Electronically Signed   By: Abelardo Diesel M.D.   On: 04/29/2017 18:31        Discharge Exam: Vitals:  05/01/17 0235 05/01/17 0540  BP: 131/80 (!) 145/69  Pulse: 71 87  Resp: 18   Temp: 98.2 F (36.8 C) 98.6 F (37 C)  SpO2: 100% 100%   Vitals:   04/30/17 1721 04/30/17 2115 05/01/17 0235 05/01/17 0540  BP: (!) 137/43 130/69 131/80 (!) 145/69  Pulse: 78 (!) 151 71 87  Resp: 18 18 18    Temp: 99 F (37.2 C) 98.2 F (36.8 C) 98.2 F (36.8 C) 98.6 F (37 C)  TempSrc: Oral Oral Oral Oral  SpO2:  100% 100% 100%  Weight:      Height:        General: Pt is alert, awake, not in acute distress Cardiovascular: RRR, S1/S2 +, no rubs, no gallops Respiratory: CTA bilaterally, no wheezing, no rhonchi Abdominal: Soft, NT, ND, bowel sounds + Extremities: no edema, no cyanosis   The results of significant diagnostics from this hospitalization (including imaging, microbiology, ancillary and laboratory) are listed below for reference.    Significant Diagnostic Studies: Dg Chest 2 View  Result Date: 04/29/2017 CLINICAL DATA:  Syncope EXAM: CHEST  2 VIEW COMPARISON:  02/09/2014 FINDINGS: Hypoventilation with bibasilar atelectasis. Negative for heart failure. Negative for pneumonia or effusion. Atherosclerotic aortic arch. IMPRESSION: Mild bibasilar atelectasis. Electronically Signed   By: Franchot Gallo M.D.   On: 04/29/2017 18:51   Ct Head Wo Contrast  Result Date: 04/29/2017 CLINICAL DATA:  Status post fall. Patient has a history of brain surgery. EXAM: CT HEAD WITHOUT CONTRAST CT CERVICAL SPINE WITHOUT CONTRAST TECHNIQUE: Multidetector CT imaging of the head and cervical spine was performed following the standard protocol without intravenous contrast. Multiplanar CT image reconstructions of the cervical spine were also generated. COMPARISON:  None. July 08, 2014, April 25, 2013 FINDINGS: CT HEAD FINDINGS Brain: No evidence of acute infarction, hemorrhage, hydrocephalus,  extra-axial collection or mass lesion/mass effect. There is chronic diffuse atrophy. Chronic bilateral periventricular white matter small vessel ischemic changes identified. Small low-density is identified in the left frontal white matter chronic unchanged compared prior exam in 2014. Small old lacunar infarctions in both basal ganglia are noted. Vascular: No hyperdense vessel or unexpected calcification. Skull: Normal. Negative for fracture or focal lesion. Sinuses/Orbits: No acute finding. Other: None. CT CERVICAL SPINE FINDINGS Alignment: Normal. Skull base and vertebrae: No acute fracture. No primary bone lesion or focal pathologic process. Soft tissues and spinal canal: No prevertebral fluid or swelling. No visible canal hematoma. Disc levels: Degenerative joint changes of the spine with narrowed joint space and osteophyte formation are identified throughout cervical spine. Upper chest: Biapical scar are noted. Other: None. IMPRESSION: No focal acute intracranial abnormality identified. No acute fracture or dislocation of cervical spine. Electronically Signed   By: Abelardo Diesel M.D.   On: 04/29/2017 18:31   Ct Cervical Spine Wo Contrast  Result Date: 04/29/2017 CLINICAL DATA:  Status post fall. Patient has a history of brain surgery. EXAM: CT HEAD WITHOUT CONTRAST CT CERVICAL SPINE WITHOUT CONTRAST TECHNIQUE: Multidetector CT imaging of the head and cervical spine was performed following the standard protocol without intravenous contrast. Multiplanar CT image reconstructions of the cervical spine were also generated. COMPARISON:  None. July 08, 2014, April 25, 2013 FINDINGS: CT HEAD FINDINGS Brain: No evidence of acute infarction, hemorrhage, hydrocephalus, extra-axial collection or mass lesion/mass effect. There is chronic diffuse atrophy. Chronic bilateral periventricular white matter small vessel ischemic changes identified. Small low-density is identified in the left frontal white matter chronic  unchanged compared prior exam in 2014. Small old  lacunar infarctions in both basal ganglia are noted. Vascular: No hyperdense vessel or unexpected calcification. Skull: Normal. Negative for fracture or focal lesion. Sinuses/Orbits: No acute finding. Other: None. CT CERVICAL SPINE FINDINGS Alignment: Normal. Skull base and vertebrae: No acute fracture. No primary bone lesion or focal pathologic process. Soft tissues and spinal canal: No prevertebral fluid or swelling. No visible canal hematoma. Disc levels: Degenerative joint changes of the spine with narrowed joint space and osteophyte formation are identified throughout cervical spine. Upper chest: Biapical scar are noted. Other: None. IMPRESSION: No focal acute intracranial abnormality identified. No acute fracture or dislocation of cervical spine. Electronically Signed   By: Abelardo Diesel M.D.   On: 04/29/2017 18:31     Microbiology: No results found for this or any previous visit (from the past 240 hour(s)).   Labs: Basic Metabolic Panel: Recent Labs  Lab 04/29/17 1944 04/30/17 0528 05/01/17 0413  NA 137 137 135  K 4.6 4.7 4.1  CL 104 106 105  CO2 23 22 23   GLUCOSE 126* 90 91  BUN 22* 24* 17  CREATININE 1.66* 1.51* 1.24  CALCIUM 11.2* 10.9* 10.8*   Liver Function Tests: Recent Labs  Lab 04/29/17 1944  AST 19  ALT 14*  ALKPHOS 113  BILITOT 0.6  PROT 6.8  ALBUMIN 3.8   No results for input(s): LIPASE, AMYLASE in the last 168 hours. No results for input(s): AMMONIA in the last 168 hours. CBC: Recent Labs  Lab 04/29/17 1944 04/30/17 0528  WBC 9.3 6.7  NEUTROABS 8.0*  --   HGB 14.1 13.2  HCT 42.1 39.5  MCV 94.6 94.7  PLT 210 219   Cardiac Enzymes: Recent Labs  Lab 04/30/17 0001 04/30/17 0528 04/30/17 1248  TROPONINI <0.03 <0.03 <0.03   BNP: Invalid input(s): POCBNP CBG: Recent Labs  Lab 04/30/17 0555 05/01/17 0619  GLUCAP 115* 88    Time coordinating discharge:  Greater than 30  minutes  Signed:  Orson Eva, DO [facility] Pager: [phone] 05/01/2017, 1:52 PM

## 2017-05-01 NOTE — NC FL2 (Signed)
[place] LEVEL OF CARE SCREENING TOOL     IDENTIFICATION  Patient Name: Richard Mack Birthdate: January 04, 1929 Sex: male Admission Date (Current Location): 04/29/2017  [place] and [place] Number:  Herbalist and Address:  [facility], [place] [address], [place], [place] [address]      Provider Number: [id number]  Attending Physician Name and Address:  Orson Eva, MD  Relative Name and Phone Number:       Current Level of Care: SNF Recommended Level of Care: [place] Prior Approval Number:    Date Approved/Denied:   PASRR Number: [id number]  Discharge Plan: SNF    Current Diagnoses: Patient Active Problem List   Diagnosis Date Noted  . Orthostatic hypotension   . Syncope and collapse   . Syncope 04/29/2017  . Dizzy 04/29/2017  . History of brain tumor 04/02/2017  . Chronic headaches 11/15/2016  . Vascular dementia without behavioral disturbance 08/17/2016  . Brain tumor ([place])   . Osteoporosis 07/01/2009  . Primary hyperparathyroidism ([place]) 01/09/2009  . Hypercalcemia 01/09/2009  . Anxiety state 01/09/2009  . Essential hypertension 01/09/2009    Orientation RESPIRATION BLADDER Height & Weight     Self, Place, Situation  Normal Continent Weight: 155 lb 6.8 oz (70.5 kg) Height:  5\' 6"  (167.6 cm)  BEHAVIORAL SYMPTOMS/MOOD NEUROLOGICAL BOWEL NUTRITION STATUS      Continent (Please see d/c summary)  AMBULATORY STATUS COMMUNICATION OF NEEDS Skin   Limited Assist Verbally Normal                       Personal Care Assistance Level of Assistance  Bathing, Feeding, Dressing Bathing Assistance: Limited assistance Feeding assistance: Independent Dressing Assistance: Limited assistance     Functional Limitations Info  Sight, Hearing, Speech Sight Info: Adequate Hearing Info: Adequate Speech Info: Adequate    SPECIAL CARE FACTORS FREQUENCY  PT (By licensed PT), OT (By licensed OT)     PT  Frequency: 2x OT Frequency: 2x            Contractures Contractures Info: Not present    Additional Factors Info  Code Status, Allergies Code Status Info: Full Code Allergies Info: Cephalexin           Current Medications (05/01/2017):  This is the current hospital active medication list Current Facility-Administered Medications  Medication Dose Route Frequency Provider Last Rate Last Dose  . 0.9 %  sodium chloride infusion  250 mL Intravenous PRN Phillips Grout, MD      . acetaminophen (TYLENOL) tablet 650 mg  650 mg Oral Daily Phillips Grout, MD   650 mg at 05/01/17 0931  . acetaminophen (TYLENOL) tablet 650 mg  650 mg Oral Q6H PRN Derrill Kay A, MD      . donepezil (ARICEPT) tablet 10 mg  10 mg Oral QHS Derrill Kay A, MD   10 mg at 04/30/17 2033  . HYDROcodone-acetaminophen (NORCO/VICODIN) 5-325 MG per tablet 1 tablet  1 tablet Oral Q8H PRN Phillips Grout, MD   1 tablet at 04/30/17 0342  . Influenza vac split quadrivalent PF (FLUZONE HIGH-DOSE) injection 0.5 mL  0.5 mL Intramuscular Tomorrow-1000 Tat, David, MD      . memantine (NAMENDA XR) 24 hr capsule 28 mg  28 mg Oral Daily Derrill Kay A, MD   28 mg at 05/01/17 0932  . sodium chloride flush (NS) 0.9 % injection 3 mL  3 mL Intravenous Q12H Derrill Kay  A, MD   3 mL at 05/01/17 0928  . sodium chloride flush (NS) 0.9 % injection 3 mL  3 mL Intravenous Q12H Derrill Kay A, MD   3 mL at 05/01/17 0928  . sodium chloride flush (NS) 0.9 % injection 3 mL  3 mL Intravenous PRN Phillips Grout, MD         Discharge Medications: Please see discharge summary for a list of discharge medications.  Relevant Imaging Results:  Relevant Lab Results:   Additional Information SSN: [id number]  Eileen Stanford, LCSW

## 2017-05-01 NOTE — Care Management Note (Signed)
Case Management Note  Patient Details  Name: Edson Deridder MRN: [id number] Date of Birth: Jun 30, 1928  Subjective/Objective:                 Patient will return to SNF at DC as facilitated by CSW.   Action/Plan:   Expected Discharge Date:  05/01/17               Expected Discharge Plan:  Skilled Nursing Facility  In-House Referral:  Clinical Social Work  Discharge planning Services     Post Acute Care Choice:    Choice offered to:     DME Arranged:    DME Agency:     HH Arranged:    [place] Agency:     Status of Service:  Completed, signed off  If discussed at [employer], dates discussed:    Additional Comments:  Carles Collet, RN 05/01/2017, 2:44 PM

## 2017-05-01 NOTE — Clinical Social Work Note (Signed)
Clinical Social Worker facilitated patient discharge including contacting patient family and facility to confirm patient discharge plans.  Clinical information faxed to facility and family agreeable with plan.  CSW arranged ambulance transport via [facility] to [facility].  RN to call [phone] (Pt will go to room 212) for report prior to discharge.  Clinical Social Worker will sign off for now as social work intervention is no longer needed. Please consult [place] again if new need arises.  [place], [place]

## 2017-05-01 NOTE — Progress Notes (Signed)
  Echocardiogram 2D Echocardiogram has been performed.  Jennette Dubin 05/01/2017, 11:55 AM

## 2017-05-01 NOTE — Clinical Social Work Placement (Signed)
   CLINICAL SOCIAL WORK PLACEMENT  NOTE  Date:  05/01/2017  Patient Details  Name: Richard Mack MRN: [id number] Date of Birth: 10/14/1928  Clinical Social Work is seeking post-discharge placement for this patient at the [place] level of care (*CSW will initial, date and re-position this form in  chart as items are completed):      Patient/family provided with [facility] Work Department's list of facilities offering this level of care within the geographic area requested by the patient (or if unable, by the patient's family).  Yes   Patient/family informed of their freedom to choose among providers that offer the needed level of care, that participate in Medicare, Medicaid or managed care program needed by the patient, have an available bed and are willing to accept the patient.      Patient/family informed of [facility]'s ownership interest in [facility] and [facility], as well as of the fact that they are under no obligation to receive care at these facilities.  PASRR submitted to EDS on       PASRR number received on 05/01/17     Existing PASRR number confirmed on 05/01/17     FL2 transmitted to all facilities in geographic area requested by pt/family on 05/01/17     FL2 transmitted to all facilities within larger geographic area on       Patient informed that his/her managed care company has contracts with or will negotiate with certain facilities, including the following:        Yes   Patient/family informed of bed offers received.  Patient chooses bed at [place]     Physician recommends and patient chooses bed at      Patient to be transferred to [facility] on 05/01/17.  Patient to be transferred to facility by [facility]     Patient family notified on 05/01/17 of transfer.  Name of family member notified:        PHYSICIAN Please prepare prescriptions, Please sign FL2     Additional  Comment:    _______________________________________________ Eileen Stanford, LCSW 05/01/2017, 2:44 PM

## 2017-05-02 ENCOUNTER — Telehealth: Payer: Self-pay

## 2017-05-02 ENCOUNTER — Encounter: Payer: Self-pay | Admitting: Adult Health

## 2017-05-02 ENCOUNTER — Non-Acute Institutional Stay (SKILLED_NURSING_FACILITY): Payer: Medicare Other | Admitting: Adult Health

## 2017-05-02 DIAGNOSIS — D496 Neoplasm of unspecified behavior of brain: Secondary | ICD-10-CM | POA: Diagnosis not present

## 2017-05-02 DIAGNOSIS — F411 Generalized anxiety disorder: Secondary | ICD-10-CM

## 2017-05-02 DIAGNOSIS — R51 Headache: Secondary | ICD-10-CM | POA: Diagnosis not present

## 2017-05-02 DIAGNOSIS — I951 Orthostatic hypotension: Secondary | ICD-10-CM | POA: Diagnosis not present

## 2017-05-02 DIAGNOSIS — R55 Syncope and collapse: Secondary | ICD-10-CM | POA: Diagnosis not present

## 2017-05-02 DIAGNOSIS — I10 Essential (primary) hypertension: Secondary | ICD-10-CM | POA: Diagnosis not present

## 2017-05-02 DIAGNOSIS — M6281 Muscle weakness (generalized): Secondary | ICD-10-CM | POA: Diagnosis not present

## 2017-05-02 DIAGNOSIS — E559 Vitamin D deficiency, unspecified: Secondary | ICD-10-CM | POA: Diagnosis not present

## 2017-05-02 DIAGNOSIS — E210 Primary hyperparathyroidism: Secondary | ICD-10-CM | POA: Diagnosis not present

## 2017-05-02 DIAGNOSIS — F0150 Vascular dementia without behavioral disturbance: Secondary | ICD-10-CM | POA: Diagnosis not present

## 2017-05-02 DIAGNOSIS — R2689 Other abnormalities of gait and mobility: Secondary | ICD-10-CM | POA: Diagnosis not present

## 2017-05-02 DIAGNOSIS — M62459 Contracture of muscle, unspecified thigh: Secondary | ICD-10-CM | POA: Diagnosis not present

## 2017-05-02 DIAGNOSIS — R519 Headache, unspecified: Secondary | ICD-10-CM

## 2017-05-02 LAB — VITAMIN D 25 HYDROXY (VIT D DEFICIENCY, FRACTURES): VIT D 25 HYDROXY: 7.4 ng/mL — AB (ref 30.0–100.0)

## 2017-05-02 NOTE — Progress Notes (Signed)
Location:   [facility] Room Number: [place] of Service:  SNF (31)   CODE STATUS: Full Code  Allergies  Allergen Reactions  . Cephalexin Nausea And Vomiting    Chief Complaint  Patient presents with  . Acute Visit    Follow up hospitalization     HPI:  He is an 83 year old long term resident of this facility who has been hospitalized from 04-29-17 through 05-01-17. He was hospitalized after having a syncopal episode likely related to a vagal reaction (questionabl micturition syncope). He does have a long standing history of hypercalcemia which can be contributing to solute diuresis and his hypovolemia. More than likely he has primary hyperparathyroidism. He cannot fully participate in the hpi or ros; but did tell me that he is feeling good. There are no reports of chest pain; sudden weakness; or changes in appetite. He will continue to be followed for his chronic illnesses including: orthostatic hypotension; vascular dementia; chronic headaches. There are no nursing concerns at this time.    Past Medical History:  Diagnosis Date  . Brain tumor ([place])   . Chronic constipation   . Headache(784.0)   . Hypercalcemia   . Hypercholesteremia   . Hypertension   . Insomnia   . Irregular heartbeat     Past Surgical History:  Procedure Laterality Date  . BRAIN SURGERY     x3  . SHOULDER SURGERY     right    Social History   Socioeconomic History  . Marital status: Married    Spouse name: Apolonio Schneiders  . Number of children: 2  . Years of education: 2nd  . Highest education level: Not on file  Social Needs  . Financial resource strain: Not on file  . Food insecurity - worry: Not on file  . Food insecurity - inability: Not on file  . Transportation needs - medical: Not on file  . Transportation needs - non-medical: Not on file  Occupational History  . Occupation: Retired  Tobacco Use  . Smoking status: Former Smoker    Years: 1.00    Types: Cigarettes   Last attempt to quit: 09/19/1976    Years since quitting: 40.6  . Smokeless tobacco: Never Used  Substance and Sexual Activity  . Alcohol use: No  . Drug use: No  . Sexual activity: Not on file  Other Topics Concern  . Not on file  Social History Narrative  . Not on file   Family History  Problem Relation Age of Onset  . Appendicitis Father   . AAA (abdominal aortic aneurysm) Father       VITAL SIGNS BP 128/68   Pulse 84   Temp 98.4 F (36.9 C)   Resp 16   Ht 5\' 6"  (1.676 m)   Wt 146 lb (66.2 kg)   SpO2 97%   BMI 23.57 kg/m   Outpatient Encounter Medications as of 05/02/2017  Medication Sig  . acetaminophen (TYLENOL) 325 MG tablet Take 650 mg by mouth daily.   Marland Kitchen acetaminophen (TYLENOL) 325 MG tablet Take 650 mg by mouth every 6 (six) hours as needed for headache.  . donepezil (ARICEPT) 10 MG tablet Take 10 mg by mouth at bedtime.   Marland Kitchen HYDROcodone-acetaminophen (NORCO/VICODIN) 5-325 MG tablet Take 1 tablet by mouth every 8 (eight) hours as needed for moderate pain.  . memantine (NAMENDA XR) 28 MG CP24 24 hr capsule Take 28 mg by mouth daily.   No facility-administered encounter medications on file as  of 05/02/2017.      SIGNIFICANT DIAGNOSTIC EXAMS  TODAY:  04-29-17: chest x-ray:  Mild bibasilar atelectasis.   04-29-17: ct of head and cervical spine:  No focal acute intracranial abnormality identified.  No acute fracture or dislocation of cervical spine.     LABS REVIEWED: PREVIOUS   08-16-16: wbc 8.6; hgb 12.9; hct 37.9; mcv 96.6; plt 240; glucose 153; bun 15.7; creat 1.03; k+ 4.1; na++ 139; ca 12.3  tsh 1.02 09-21-16: PTH: 151.800 02-04-17: wbc 4.6; hgb 12.8; hct 39; plt 223; glucose 98; bun 24; creat 1.3; k+ 4.6 na++ 136  Liver normal    tsh 0.97 04-08-17: wbc 10.7; hgb 14.5; hct 42.8; mcv 93.2; plt 227; glucose 110; bun 20; creat 1.50; k+ 4.4; na++ 136; ca 11.7; liver normal albumin 3.9  TODAY:   04-29-17: wbc 9.3; hgb 14.1; hct 42.1; mcv 94.6; plt 210;  glucose 126; bun 22; creat 1.66 ;k+ 4.6; na++ 137; ca 11.2; liver normal albumin 3.8; d-dimer: 0.48;  04-30-17: wbc6.2; hgb 13.2; hct 39.5; mcv 94.7; plt 219; glucose 90; bun 24; creat 1.51; k+ 4.7; na++ 137  PTH: 112  05-01-17: vit D 7.4    Review of Systems  Unable to perform ROS: Dementia (confused )    Physical Exam  Constitutional: No distress.  Frail   Neck: No thyromegaly present.  Cardiovascular: Normal rate, regular rhythm and intact distal pulses.  Murmur heard. 1/6  Pulmonary/Chest: Effort normal and breath sounds normal. No respiratory distress.  Abdominal: Soft. Bowel sounds are normal. He exhibits no distension. There is no tenderness.  Musculoskeletal: Normal range of motion. He exhibits no edema.  Lymphadenopathy:    He has no cervical adenopathy.  Neurological: He is alert.  Skin: Skin is warm and dry. He is not diaphoretic.  Psychiatric: He has a normal mood and affect.     ASSESSMENT/ PLAN:  TODAY:   1. Orthostatic hypotension: has essential hypertension: is stable: b/p128/68 his medications for hypertension have been stopped will continue to monitor   2. Anxiety:is stable  is currently off all medications will monitor his status   3. Primary hyperparathyroidism: unchanged:  PTH 112 will monitor  4. Brain tumor: unchanged:  is status post surgery X3; has headaches: will continue tylenol daily and every 6 hours as needed. Has vicodin 5/325 mg every 8 hours as needed for pain.   5. Vascular dementia without behavioral disturbance: without change: is on aricept 10 mg nightly and namenda xr 28 mg daily  His weight is 146 pounds will monitor   6. Hypercalcemia: without change ca level 11.2;  vit D 7.4  Will check cbc; cmp  Will setup endocrinology consult for hyperparathyroidism and hypercalcemia.    MD is aware of resident's narcotic use and is in agreement with current plan of care. We will attempt to wean resident as apropriate     Ok Edwards  NP [facility]  Contact [phone] Monday through Friday 8am- 5pm  After hours call [phone]

## 2017-05-02 NOTE — Telephone Encounter (Signed)
This is a patient of [facility], who was admitted to [facility] after hospitalization. TOC - Hospital F/U is needed. Hospital discharge from [facility] on 05/01/2017. 

## 2017-05-04 DIAGNOSIS — R2689 Other abnormalities of gait and mobility: Secondary | ICD-10-CM | POA: Diagnosis not present

## 2017-05-04 DIAGNOSIS — M62459 Contracture of muscle, unspecified thigh: Secondary | ICD-10-CM | POA: Diagnosis not present

## 2017-05-04 DIAGNOSIS — M6281 Muscle weakness (generalized): Secondary | ICD-10-CM | POA: Diagnosis not present

## 2017-05-04 DIAGNOSIS — R55 Syncope and collapse: Secondary | ICD-10-CM | POA: Diagnosis not present

## 2017-05-05 DIAGNOSIS — R2689 Other abnormalities of gait and mobility: Secondary | ICD-10-CM | POA: Diagnosis not present

## 2017-05-05 DIAGNOSIS — M62459 Contracture of muscle, unspecified thigh: Secondary | ICD-10-CM | POA: Diagnosis not present

## 2017-05-05 DIAGNOSIS — D649 Anemia, unspecified: Secondary | ICD-10-CM | POA: Diagnosis not present

## 2017-05-05 DIAGNOSIS — M6281 Muscle weakness (generalized): Secondary | ICD-10-CM | POA: Diagnosis not present

## 2017-05-05 DIAGNOSIS — I10 Essential (primary) hypertension: Secondary | ICD-10-CM | POA: Diagnosis not present

## 2017-05-05 DIAGNOSIS — R55 Syncope and collapse: Secondary | ICD-10-CM | POA: Diagnosis not present

## 2017-05-05 LAB — CBC AND DIFFERENTIAL
HCT: 38 — AB (ref 41–53)
HEMOGLOBIN: 12.7 — AB (ref 13.5–17.5)
Neutrophils Absolute: 3
PLATELETS: 203 (ref 150–399)
WBC: 4.9

## 2017-05-06 DIAGNOSIS — M6281 Muscle weakness (generalized): Secondary | ICD-10-CM | POA: Diagnosis not present

## 2017-05-06 DIAGNOSIS — R2689 Other abnormalities of gait and mobility: Secondary | ICD-10-CM | POA: Diagnosis not present

## 2017-05-06 DIAGNOSIS — R55 Syncope and collapse: Secondary | ICD-10-CM | POA: Diagnosis not present

## 2017-05-06 DIAGNOSIS — M62459 Contracture of muscle, unspecified thigh: Secondary | ICD-10-CM | POA: Diagnosis not present

## 2017-05-09 DIAGNOSIS — M6281 Muscle weakness (generalized): Secondary | ICD-10-CM | POA: Diagnosis not present

## 2017-05-09 DIAGNOSIS — M62459 Contracture of muscle, unspecified thigh: Secondary | ICD-10-CM | POA: Diagnosis not present

## 2017-05-09 DIAGNOSIS — R2689 Other abnormalities of gait and mobility: Secondary | ICD-10-CM | POA: Diagnosis not present

## 2017-05-09 DIAGNOSIS — R55 Syncope and collapse: Secondary | ICD-10-CM | POA: Diagnosis not present

## 2017-05-11 ENCOUNTER — Non-Acute Institutional Stay (SKILLED_NURSING_FACILITY): Payer: Medicare Other | Admitting: Adult Health

## 2017-05-11 ENCOUNTER — Encounter: Payer: Self-pay | Admitting: Adult Health

## 2017-05-11 DIAGNOSIS — I951 Orthostatic hypotension: Secondary | ICD-10-CM | POA: Diagnosis not present

## 2017-05-11 DIAGNOSIS — M62459 Contracture of muscle, unspecified thigh: Secondary | ICD-10-CM | POA: Diagnosis not present

## 2017-05-11 DIAGNOSIS — R55 Syncope and collapse: Secondary | ICD-10-CM | POA: Diagnosis not present

## 2017-05-11 DIAGNOSIS — M6281 Muscle weakness (generalized): Secondary | ICD-10-CM | POA: Diagnosis not present

## 2017-05-11 DIAGNOSIS — R2689 Other abnormalities of gait and mobility: Secondary | ICD-10-CM | POA: Diagnosis not present

## 2017-05-11 DIAGNOSIS — F0150 Vascular dementia without behavioral disturbance: Secondary | ICD-10-CM

## 2017-05-11 NOTE — Progress Notes (Signed)
Location:   [facility] Room Number: [place] of Service:  SNF (31)   CODE STATUS: Full Code  Allergies  Allergen Reactions  . Cephalexin Nausea And Vomiting    Chief Complaint  Patient presents with  . Acute Visit    Care Plan Meeting    HPI:  We have come together for his care plan meeting. His family is not available and he is unable to participate in the care plan. We did discuss his plan of care; his medication regimen. His MOST form was updated on 02-10-17. There are no nursing issues at this time. There are no reports of further syncope episodes; no reports of changes in appetite and no changes in behaviors.   Past Medical History:  Diagnosis Date  . Brain tumor ([place])   . Chronic constipation   . Headache(784.0)   . Hypercalcemia   . Hypercholesteremia   . Hypertension   . Insomnia   . Irregular heartbeat     Past Surgical History:  Procedure Laterality Date  . BRAIN SURGERY     x3  . SHOULDER SURGERY     right    Social History   Socioeconomic History  . Marital status: Married    Spouse name: Apolonio Schneiders  . Number of children: 2  . Years of education: 2nd  . Highest education level: Not on file  Social Needs  . Financial resource strain: Not on file  . Food insecurity - worry: Not on file  . Food insecurity - inability: Not on file  . Transportation needs - medical: Not on file  . Transportation needs - non-medical: Not on file  Occupational History  . Occupation: Retired  Tobacco Use  . Smoking status: Former Smoker    Years: 1.00    Types: Cigarettes    Last attempt to quit: 09/19/1976    Years since quitting: 40.6  . Smokeless tobacco: Never Used  Substance and Sexual Activity  . Alcohol use: No  . Drug use: No  . Sexual activity: Not on file  Other Topics Concern  . Not on file  Social History Narrative  . Not on file   Family History  Problem Relation Age of Onset  . Appendicitis Father   . AAA (abdominal aortic  aneurysm) Father       VITAL SIGNS BP 128/68   Pulse 84   Temp 98.4 F (36.9 C)   Resp 16   Ht 5\' 6"  (1.676 m)   Wt 146 lb (66.2 kg)   SpO2 97%   BMI 23.57 kg/m    Outpatient Encounter Medications as of 05/11/2017  Medication Sig  . acetaminophen (TYLENOL) 325 MG tablet Take 650 mg by mouth daily.   Marland Kitchen acetaminophen (TYLENOL) 325 MG tablet Take 650 mg by mouth every 6 (six) hours as needed for headache.  . donepezil (ARICEPT) 10 MG tablet Take 10 mg by mouth at bedtime.   Marland Kitchen HYDROcodone-acetaminophen (NORCO/VICODIN) 5-325 MG tablet Take 1 tablet by mouth every 8 (eight) hours as needed for moderate pain.  . memantine (NAMENDA XR) 28 MG CP24 24 hr capsule Take 28 mg by mouth daily.   No facility-administered encounter medications on file as of 05/11/2017.      SIGNIFICANT DIAGNOSTIC EXAMS  PREVIOUS:  04-29-17: chest x-ray:  Mild bibasilar atelectasis.   04-29-17: ct of head and cervical spine:  No focal acute intracranial abnormality identified.  No acute fracture or dislocation of cervical spine.   NO NEW EXAMS  LABS REVIEWED: PREVIOUS   08-16-16: wbc 8.6; hgb 12.9; hct 37.9; mcv 96.6; plt 240; glucose 153; bun 15.7; creat 1.03; k+ 4.1; na++ 139; ca 12.3  tsh 1.02 09-21-16: PTH: 151.800 02-04-17: wbc 4.6; hgb 12.8; hct 39; plt 223; glucose 98; bun 24; creat 1.3; k+ 4.6 na++ 136  Liver normal    tsh 0.97 04-08-17: wbc 10.7; hgb 14.5; hct 42.8; mcv 93.2; plt 227; glucose 110; bun 20; creat 1.50; k+ 4.4; na++ 136; ca 11.7; liver normal albumin 3.9 04-29-17: wbc 9.3; hgb 14.1; hct 42.1; mcv 94.6; plt 210; glucose 126; bun 22; creat 1.66 ;k+ 4.6; na++ 137; ca 11.2; liver normal albumin 3.8; d-dimer: 0.48;  04-30-17: wbc6.2; hgb 13.2; hct 39.5; mcv 94.7; plt 219; glucose 90; bun 24; creat 1.51; k+ 4.7; na++ 137  PTH: 112  05-01-17: vit D 7.4  NO NEW LABS      Review of Systems  Unable to perform ROS: Dementia (is confused )    Physical Exam  Constitutional: No distress.    Frail   Neck: Neck supple. No thyromegaly present.  Cardiovascular: Normal rate, regular rhythm and intact distal pulses.  Murmur heard. 1/6  Pulmonary/Chest: Effort normal and breath sounds normal. No respiratory distress.  Abdominal: Soft. Bowel sounds are normal. He exhibits no distension. There is no tenderness.  Musculoskeletal: Normal range of motion. He exhibits no edema.  Lymphadenopathy:    He has no cervical adenopathy.  Neurological: He is alert.  Skin: Skin is warm and dry. He is not diaphoretic.  Psychiatric: He has a normal mood and affect.    ASSESSMENT/ PLAN:  TODAY;   1. Orthostatic hypotension 2. Vascular dementia without behavioral disturbance  Will continue his current plan of care Will not make medication changes  Time spent with patient and care plan team: 40 minutes: discussed medical status; nursing concerns; care plan and medications.   MD is aware of resident's narcotic use and is in agreement with current plan of care. We will attempt to wean resident as apropriate   Ok Edwards NP [facility]  Contact [phone] Monday through Friday 8am- 5pm  After hours call [phone]

## 2017-05-12 DIAGNOSIS — R2689 Other abnormalities of gait and mobility: Secondary | ICD-10-CM | POA: Diagnosis not present

## 2017-05-12 DIAGNOSIS — R55 Syncope and collapse: Secondary | ICD-10-CM | POA: Diagnosis not present

## 2017-05-12 DIAGNOSIS — M6281 Muscle weakness (generalized): Secondary | ICD-10-CM | POA: Diagnosis not present

## 2017-05-12 DIAGNOSIS — M62459 Contracture of muscle, unspecified thigh: Secondary | ICD-10-CM | POA: Diagnosis not present

## 2017-05-13 DIAGNOSIS — M6281 Muscle weakness (generalized): Secondary | ICD-10-CM | POA: Diagnosis not present

## 2017-05-13 DIAGNOSIS — M62459 Contracture of muscle, unspecified thigh: Secondary | ICD-10-CM | POA: Diagnosis not present

## 2017-05-13 DIAGNOSIS — R55 Syncope and collapse: Secondary | ICD-10-CM | POA: Diagnosis not present

## 2017-05-13 DIAGNOSIS — R2689 Other abnormalities of gait and mobility: Secondary | ICD-10-CM | POA: Diagnosis not present

## 2017-05-15 ENCOUNTER — Encounter: Payer: Self-pay | Admitting: Internal Medicine

## 2017-05-15 DIAGNOSIS — E559 Vitamin D deficiency, unspecified: Secondary | ICD-10-CM | POA: Insufficient documentation

## 2017-05-16 DIAGNOSIS — M62459 Contracture of muscle, unspecified thigh: Secondary | ICD-10-CM | POA: Diagnosis not present

## 2017-05-16 DIAGNOSIS — R55 Syncope and collapse: Secondary | ICD-10-CM | POA: Diagnosis not present

## 2017-05-16 DIAGNOSIS — M6281 Muscle weakness (generalized): Secondary | ICD-10-CM | POA: Diagnosis not present

## 2017-05-16 DIAGNOSIS — R2689 Other abnormalities of gait and mobility: Secondary | ICD-10-CM | POA: Diagnosis not present

## 2017-05-17 DIAGNOSIS — M62459 Contracture of muscle, unspecified thigh: Secondary | ICD-10-CM | POA: Diagnosis not present

## 2017-05-17 DIAGNOSIS — R55 Syncope and collapse: Secondary | ICD-10-CM | POA: Diagnosis not present

## 2017-05-17 DIAGNOSIS — R2689 Other abnormalities of gait and mobility: Secondary | ICD-10-CM | POA: Diagnosis not present

## 2017-05-17 DIAGNOSIS — M6281 Muscle weakness (generalized): Secondary | ICD-10-CM | POA: Diagnosis not present

## 2017-05-18 DIAGNOSIS — R2689 Other abnormalities of gait and mobility: Secondary | ICD-10-CM | POA: Diagnosis not present

## 2017-05-18 DIAGNOSIS — R55 Syncope and collapse: Secondary | ICD-10-CM | POA: Diagnosis not present

## 2017-05-18 DIAGNOSIS — M6281 Muscle weakness (generalized): Secondary | ICD-10-CM | POA: Diagnosis not present

## 2017-05-18 DIAGNOSIS — M62459 Contracture of muscle, unspecified thigh: Secondary | ICD-10-CM | POA: Diagnosis not present

## 2017-05-19 DIAGNOSIS — R2689 Other abnormalities of gait and mobility: Secondary | ICD-10-CM | POA: Diagnosis not present

## 2017-05-19 DIAGNOSIS — M62459 Contracture of muscle, unspecified thigh: Secondary | ICD-10-CM | POA: Diagnosis not present

## 2017-05-19 DIAGNOSIS — M6281 Muscle weakness (generalized): Secondary | ICD-10-CM | POA: Diagnosis not present

## 2017-05-19 DIAGNOSIS — R55 Syncope and collapse: Secondary | ICD-10-CM | POA: Diagnosis not present

## 2017-05-20 DIAGNOSIS — M6281 Muscle weakness (generalized): Secondary | ICD-10-CM | POA: Diagnosis not present

## 2017-05-20 DIAGNOSIS — R2689 Other abnormalities of gait and mobility: Secondary | ICD-10-CM | POA: Diagnosis not present

## 2017-05-20 DIAGNOSIS — R55 Syncope and collapse: Secondary | ICD-10-CM | POA: Diagnosis not present

## 2017-05-20 DIAGNOSIS — M62459 Contracture of muscle, unspecified thigh: Secondary | ICD-10-CM | POA: Diagnosis not present

## 2017-05-22 DIAGNOSIS — M62459 Contracture of muscle, unspecified thigh: Secondary | ICD-10-CM | POA: Diagnosis not present

## 2017-05-22 DIAGNOSIS — R2689 Other abnormalities of gait and mobility: Secondary | ICD-10-CM | POA: Diagnosis not present

## 2017-05-22 DIAGNOSIS — R55 Syncope and collapse: Secondary | ICD-10-CM | POA: Diagnosis not present

## 2017-05-22 DIAGNOSIS — M6281 Muscle weakness (generalized): Secondary | ICD-10-CM | POA: Diagnosis not present

## 2017-05-23 DIAGNOSIS — R55 Syncope and collapse: Secondary | ICD-10-CM | POA: Diagnosis not present

## 2017-05-23 DIAGNOSIS — M62459 Contracture of muscle, unspecified thigh: Secondary | ICD-10-CM | POA: Diagnosis not present

## 2017-05-23 DIAGNOSIS — R2689 Other abnormalities of gait and mobility: Secondary | ICD-10-CM | POA: Diagnosis not present

## 2017-05-23 DIAGNOSIS — M6281 Muscle weakness (generalized): Secondary | ICD-10-CM | POA: Diagnosis not present

## 2017-05-24 DIAGNOSIS — M62459 Contracture of muscle, unspecified thigh: Secondary | ICD-10-CM | POA: Diagnosis not present

## 2017-05-24 DIAGNOSIS — R55 Syncope and collapse: Secondary | ICD-10-CM | POA: Diagnosis not present

## 2017-05-24 DIAGNOSIS — R2689 Other abnormalities of gait and mobility: Secondary | ICD-10-CM | POA: Diagnosis not present

## 2017-05-24 DIAGNOSIS — M6281 Muscle weakness (generalized): Secondary | ICD-10-CM | POA: Diagnosis not present

## 2017-05-25 DIAGNOSIS — R2689 Other abnormalities of gait and mobility: Secondary | ICD-10-CM | POA: Diagnosis not present

## 2017-05-25 DIAGNOSIS — M62459 Contracture of muscle, unspecified thigh: Secondary | ICD-10-CM | POA: Diagnosis not present

## 2017-05-25 DIAGNOSIS — M6281 Muscle weakness (generalized): Secondary | ICD-10-CM | POA: Diagnosis not present

## 2017-05-25 DIAGNOSIS — R55 Syncope and collapse: Secondary | ICD-10-CM | POA: Diagnosis not present

## 2017-05-26 ENCOUNTER — Non-Acute Institutional Stay (SKILLED_NURSING_FACILITY): Payer: Medicare Other | Admitting: Adult Health

## 2017-05-26 ENCOUNTER — Encounter: Payer: Self-pay | Admitting: Adult Health

## 2017-05-26 DIAGNOSIS — M62459 Contracture of muscle, unspecified thigh: Secondary | ICD-10-CM | POA: Diagnosis not present

## 2017-05-26 DIAGNOSIS — R2689 Other abnormalities of gait and mobility: Secondary | ICD-10-CM | POA: Diagnosis not present

## 2017-05-26 DIAGNOSIS — Z20828 Contact with and (suspected) exposure to other viral communicable diseases: Secondary | ICD-10-CM | POA: Diagnosis not present

## 2017-05-26 DIAGNOSIS — M6281 Muscle weakness (generalized): Secondary | ICD-10-CM | POA: Diagnosis not present

## 2017-05-26 DIAGNOSIS — R55 Syncope and collapse: Secondary | ICD-10-CM | POA: Diagnosis not present

## 2017-05-26 NOTE — Progress Notes (Signed)
Location:   [facility] Room Number: [place] of Service:  SNF (31)   CODE STATUS:  Full Code  Allergies  Allergen Reactions  . Cephalexin Nausea And Vomiting    Chief Complaint  Patient presents with  . Acute Visit    Flu Like Symptoms    HPI:  He has been exposed to influenza A. There is an outbreak on this unit. He is unable to fully participate in the hpi or ros. There are no reports of cough; sore throat; body aches or fever. There are no nursing concerns at this time.   Past Medical History:  Diagnosis Date  . Brain tumor ([place])   . Chronic constipation   . Headache(784.0)   . Hypercalcemia   . Hypercholesteremia   . Hypertension   . Insomnia   . Irregular heartbeat     Past Surgical History:  Procedure Laterality Date  . BRAIN SURGERY     x3  . SHOULDER SURGERY     right    Social History   Socioeconomic History  . Marital status: Married    Spouse name: Apolonio Schneiders  . Number of children: 2  . Years of education: 2nd  . Highest education level: Not on file  Social Needs  . Financial resource strain: Not on file  . Food insecurity - worry: Not on file  . Food insecurity - inability: Not on file  . Transportation needs - medical: Not on file  . Transportation needs - non-medical: Not on file  Occupational History  . Occupation: Retired  Tobacco Use  . Smoking status: Former Smoker    Years: 1.00    Types: Cigarettes    Last attempt to quit: 09/19/1976    Years since quitting: 40.7  . Smokeless tobacco: Never Used  Substance and Sexual Activity  . Alcohol use: No  . Drug use: No  . Sexual activity: Not on file  Other Topics Concern  . Not on file  Social History Narrative  . Not on file   Family History  Problem Relation Age of Onset  . Appendicitis Father   . AAA (abdominal aortic aneurysm) Father       VITAL SIGNS BP 138/69   Pulse 78   Temp (!) 97.2 F (36.2 C)   Resp 18   Ht 5\' 6"  (1.676 m)   Wt 155 lb 4.8 oz  (70.4 kg)   SpO2 98%   BMI 25.07 kg/m   Outpatient Encounter Medications as of 05/26/2017  Medication Sig  . acetaminophen (TYLENOL) 325 MG tablet Take 650 mg by mouth daily.   Marland Kitchen acetaminophen (TYLENOL) 325 MG tablet Take 650 mg by mouth every 6 (six) hours as needed for headache.  . donepezil (ARICEPT) 10 MG tablet Take 10 mg by mouth at bedtime.   Marland Kitchen HYDROcodone-acetaminophen (NORCO/VICODIN) 5-325 MG tablet Take 1 tablet by mouth every 8 (eight) hours as needed for moderate pain.  . memantine (NAMENDA XR) 28 MG CP24 24 hr capsule Take 28 mg by mouth daily.   No facility-administered encounter medications on file as of 05/26/2017.      SIGNIFICANT DIAGNOSTIC EXAMS  PREVIOUS:  04-29-17: chest x-ray:  Mild bibasilar atelectasis.   04-29-17: ct of head and cervical spine:  No focal acute intracranial abnormality identified.  No acute fracture or dislocation of cervical spine.   NO NEW EXAMS   LABS REVIEWED: PREVIOUS   08-16-16: wbc 8.6; hgb 12.9; hct 37.9; mcv 96.6; plt 240; glucose 153;  bun 15.7; creat 1.03; k+ 4.1; na++ 139; ca 12.3  tsh 1.02 09-21-16: PTH: 151.800 02-04-17: wbc 4.6; hgb 12.8; hct 39; plt 223; glucose 98; bun 24; creat 1.3; k+ 4.6 na++ 136  Liver normal    tsh 0.97 04-08-17: wbc 10.7; hgb 14.5; hct 42.8; mcv 93.2; plt 227; glucose 110; bun 20; creat 1.50; k+ 4.4; na++ 136; ca 11.7; liver normal albumin 3.9 04-29-17: wbc 9.3; hgb 14.1; hct 42.1; mcv 94.6; plt 210; glucose 126; bun 22; creat 1.66 ;k+ 4.6; na++ 137; ca 11.2; liver normal albumin 3.8; d-dimer: 0.48;  04-30-17: wbc6.2; hgb 13.2; hct 39.5; mcv 94.7; plt 219; glucose 90; bun 24; creat 1.51; k+ 4.7; na++ 137  PTH: 112  05-01-17: vit D 7.4  NO NEW LABS      Review of Systems  Unable to perform ROS: Dementia (confused )   Physical Exam  Constitutional: No distress.  Frail   HENT:  Mouth/Throat: Oropharynx is clear and moist.  Neck: No thyromegaly present.  Cardiovascular: Normal rate, regular  rhythm and intact distal pulses.  Murmur heard. 1/6  Pulmonary/Chest: Effort normal and breath sounds normal. No respiratory distress.  Abdominal: Soft. Bowel sounds are normal. He exhibits no distension. There is no tenderness.  Musculoskeletal: Normal range of motion. He exhibits no edema.  Lymphadenopathy:    He has no cervical adenopathy.  Neurological: He is alert.  Skin: Skin is warm and dry. He is not diaphoretic.    ASSESSMENT/ PLAN:  TODAY;   1. Exposure to influenza: will begin tamiflu 75 mg daily for 14 days and will monitor      MD is aware of resident's narcotic use and is in agreement with current plan of care. We will attempt to wean resident as apropriate     Ok Edwards NP [facility]  Contact [phone] Monday through Friday 8am- 5pm  After hours call [phone]

## 2017-05-31 DIAGNOSIS — R2689 Other abnormalities of gait and mobility: Secondary | ICD-10-CM | POA: Diagnosis not present

## 2017-05-31 DIAGNOSIS — M6281 Muscle weakness (generalized): Secondary | ICD-10-CM | POA: Diagnosis not present

## 2017-05-31 DIAGNOSIS — R55 Syncope and collapse: Secondary | ICD-10-CM | POA: Diagnosis not present

## 2017-05-31 DIAGNOSIS — M62459 Contracture of muscle, unspecified thigh: Secondary | ICD-10-CM | POA: Diagnosis not present

## 2017-06-01 DIAGNOSIS — R55 Syncope and collapse: Secondary | ICD-10-CM | POA: Diagnosis not present

## 2017-06-01 DIAGNOSIS — M6281 Muscle weakness (generalized): Secondary | ICD-10-CM | POA: Diagnosis not present

## 2017-06-01 DIAGNOSIS — R2689 Other abnormalities of gait and mobility: Secondary | ICD-10-CM | POA: Diagnosis not present

## 2017-06-01 DIAGNOSIS — M62459 Contracture of muscle, unspecified thigh: Secondary | ICD-10-CM | POA: Diagnosis not present

## 2017-06-02 ENCOUNTER — Non-Acute Institutional Stay (SKILLED_NURSING_FACILITY): Payer: Medicare Other | Admitting: Internal Medicine

## 2017-06-02 DIAGNOSIS — D496 Neoplasm of unspecified behavior of brain: Secondary | ICD-10-CM | POA: Diagnosis not present

## 2017-06-02 DIAGNOSIS — R55 Syncope and collapse: Secondary | ICD-10-CM | POA: Diagnosis not present

## 2017-06-02 DIAGNOSIS — G8929 Other chronic pain: Secondary | ICD-10-CM

## 2017-06-02 DIAGNOSIS — M6281 Muscle weakness (generalized): Secondary | ICD-10-CM | POA: Diagnosis not present

## 2017-06-02 DIAGNOSIS — R2689 Other abnormalities of gait and mobility: Secondary | ICD-10-CM | POA: Diagnosis not present

## 2017-06-02 DIAGNOSIS — E210 Primary hyperparathyroidism: Secondary | ICD-10-CM

## 2017-06-02 DIAGNOSIS — M62459 Contracture of muscle, unspecified thigh: Secondary | ICD-10-CM | POA: Diagnosis not present

## 2017-06-02 DIAGNOSIS — F0150 Vascular dementia without behavioral disturbance: Secondary | ICD-10-CM

## 2017-06-02 DIAGNOSIS — R51 Headache: Secondary | ICD-10-CM | POA: Diagnosis not present

## 2017-06-03 DIAGNOSIS — Z85841 Personal history of malignant neoplasm of brain: Secondary | ICD-10-CM | POA: Diagnosis not present

## 2017-06-03 DIAGNOSIS — F0390 Unspecified dementia without behavioral disturbance: Secondary | ICD-10-CM | POA: Diagnosis not present

## 2017-06-03 DIAGNOSIS — I10 Essential (primary) hypertension: Secondary | ICD-10-CM | POA: Diagnosis not present

## 2017-06-03 DIAGNOSIS — R55 Syncope and collapse: Secondary | ICD-10-CM | POA: Diagnosis not present

## 2017-06-03 DIAGNOSIS — M6281 Muscle weakness (generalized): Secondary | ICD-10-CM | POA: Diagnosis not present

## 2017-06-03 DIAGNOSIS — D649 Anemia, unspecified: Secondary | ICD-10-CM | POA: Diagnosis not present

## 2017-06-03 LAB — CBC AND DIFFERENTIAL
HCT: 42 (ref 41–53)
Hemoglobin: 13.7 (ref 13.5–17.5)
PLATELETS: 215 (ref 150–399)
WBC: 4.9

## 2017-06-03 LAB — BASIC METABOLIC PANEL
BUN: 22 — AB (ref 4–21)
CREATININE: 1.4 — AB (ref 0.6–1.3)
Glucose: 146
Potassium: 4.7 (ref 3.4–5.3)
Sodium: 140 (ref 137–147)

## 2017-06-06 DIAGNOSIS — F0390 Unspecified dementia without behavioral disturbance: Secondary | ICD-10-CM | POA: Diagnosis not present

## 2017-06-06 DIAGNOSIS — Z85841 Personal history of malignant neoplasm of brain: Secondary | ICD-10-CM | POA: Diagnosis not present

## 2017-06-06 DIAGNOSIS — M6281 Muscle weakness (generalized): Secondary | ICD-10-CM | POA: Diagnosis not present

## 2017-06-06 DIAGNOSIS — R55 Syncope and collapse: Secondary | ICD-10-CM | POA: Diagnosis not present

## 2017-06-07 DIAGNOSIS — R55 Syncope and collapse: Secondary | ICD-10-CM | POA: Diagnosis not present

## 2017-06-07 DIAGNOSIS — M6281 Muscle weakness (generalized): Secondary | ICD-10-CM | POA: Diagnosis not present

## 2017-06-07 DIAGNOSIS — Z85841 Personal history of malignant neoplasm of brain: Secondary | ICD-10-CM | POA: Diagnosis not present

## 2017-06-07 DIAGNOSIS — F0390 Unspecified dementia without behavioral disturbance: Secondary | ICD-10-CM | POA: Diagnosis not present

## 2017-06-08 DIAGNOSIS — Z85841 Personal history of malignant neoplasm of brain: Secondary | ICD-10-CM | POA: Diagnosis not present

## 2017-06-08 DIAGNOSIS — F0390 Unspecified dementia without behavioral disturbance: Secondary | ICD-10-CM | POA: Diagnosis not present

## 2017-06-08 DIAGNOSIS — R55 Syncope and collapse: Secondary | ICD-10-CM | POA: Diagnosis not present

## 2017-06-08 DIAGNOSIS — M6281 Muscle weakness (generalized): Secondary | ICD-10-CM | POA: Diagnosis not present

## 2017-06-09 DIAGNOSIS — R55 Syncope and collapse: Secondary | ICD-10-CM | POA: Diagnosis not present

## 2017-06-09 DIAGNOSIS — Z85841 Personal history of malignant neoplasm of brain: Secondary | ICD-10-CM | POA: Diagnosis not present

## 2017-06-09 DIAGNOSIS — F0390 Unspecified dementia without behavioral disturbance: Secondary | ICD-10-CM | POA: Diagnosis not present

## 2017-06-09 DIAGNOSIS — M6281 Muscle weakness (generalized): Secondary | ICD-10-CM | POA: Diagnosis not present

## 2017-06-10 DIAGNOSIS — F0390 Unspecified dementia without behavioral disturbance: Secondary | ICD-10-CM | POA: Diagnosis not present

## 2017-06-10 DIAGNOSIS — Z85841 Personal history of malignant neoplasm of brain: Secondary | ICD-10-CM | POA: Diagnosis not present

## 2017-06-10 DIAGNOSIS — R55 Syncope and collapse: Secondary | ICD-10-CM | POA: Diagnosis not present

## 2017-06-10 DIAGNOSIS — M6281 Muscle weakness (generalized): Secondary | ICD-10-CM | POA: Diagnosis not present

## 2017-06-13 DIAGNOSIS — F0390 Unspecified dementia without behavioral disturbance: Secondary | ICD-10-CM | POA: Diagnosis not present

## 2017-06-13 DIAGNOSIS — R55 Syncope and collapse: Secondary | ICD-10-CM | POA: Diagnosis not present

## 2017-06-13 DIAGNOSIS — Z85841 Personal history of malignant neoplasm of brain: Secondary | ICD-10-CM | POA: Diagnosis not present

## 2017-06-13 DIAGNOSIS — M6281 Muscle weakness (generalized): Secondary | ICD-10-CM | POA: Diagnosis not present

## 2017-06-14 DIAGNOSIS — R55 Syncope and collapse: Secondary | ICD-10-CM | POA: Diagnosis not present

## 2017-06-14 DIAGNOSIS — Z85841 Personal history of malignant neoplasm of brain: Secondary | ICD-10-CM | POA: Diagnosis not present

## 2017-06-14 DIAGNOSIS — F0390 Unspecified dementia without behavioral disturbance: Secondary | ICD-10-CM | POA: Diagnosis not present

## 2017-06-14 DIAGNOSIS — M6281 Muscle weakness (generalized): Secondary | ICD-10-CM | POA: Diagnosis not present

## 2017-06-15 DIAGNOSIS — M6281 Muscle weakness (generalized): Secondary | ICD-10-CM | POA: Diagnosis not present

## 2017-06-15 DIAGNOSIS — F0390 Unspecified dementia without behavioral disturbance: Secondary | ICD-10-CM | POA: Diagnosis not present

## 2017-06-15 DIAGNOSIS — Z85841 Personal history of malignant neoplasm of brain: Secondary | ICD-10-CM | POA: Diagnosis not present

## 2017-06-15 DIAGNOSIS — R55 Syncope and collapse: Secondary | ICD-10-CM | POA: Diagnosis not present

## 2017-06-16 DIAGNOSIS — R55 Syncope and collapse: Secondary | ICD-10-CM | POA: Diagnosis not present

## 2017-06-16 DIAGNOSIS — Z85841 Personal history of malignant neoplasm of brain: Secondary | ICD-10-CM | POA: Diagnosis not present

## 2017-06-16 DIAGNOSIS — M6281 Muscle weakness (generalized): Secondary | ICD-10-CM | POA: Diagnosis not present

## 2017-06-16 DIAGNOSIS — F0390 Unspecified dementia without behavioral disturbance: Secondary | ICD-10-CM | POA: Diagnosis not present

## 2017-06-17 DIAGNOSIS — F0390 Unspecified dementia without behavioral disturbance: Secondary | ICD-10-CM | POA: Diagnosis not present

## 2017-06-17 DIAGNOSIS — Z85841 Personal history of malignant neoplasm of brain: Secondary | ICD-10-CM | POA: Diagnosis not present

## 2017-06-17 DIAGNOSIS — R55 Syncope and collapse: Secondary | ICD-10-CM | POA: Diagnosis not present

## 2017-06-17 DIAGNOSIS — M6281 Muscle weakness (generalized): Secondary | ICD-10-CM | POA: Diagnosis not present

## 2017-06-20 DIAGNOSIS — Z85841 Personal history of malignant neoplasm of brain: Secondary | ICD-10-CM | POA: Diagnosis not present

## 2017-06-20 DIAGNOSIS — R55 Syncope and collapse: Secondary | ICD-10-CM | POA: Diagnosis not present

## 2017-06-20 DIAGNOSIS — F0390 Unspecified dementia without behavioral disturbance: Secondary | ICD-10-CM | POA: Diagnosis not present

## 2017-06-20 DIAGNOSIS — M6281 Muscle weakness (generalized): Secondary | ICD-10-CM | POA: Diagnosis not present

## 2017-06-21 DIAGNOSIS — M6281 Muscle weakness (generalized): Secondary | ICD-10-CM | POA: Diagnosis not present

## 2017-06-21 DIAGNOSIS — Z85841 Personal history of malignant neoplasm of brain: Secondary | ICD-10-CM | POA: Diagnosis not present

## 2017-06-21 DIAGNOSIS — F0390 Unspecified dementia without behavioral disturbance: Secondary | ICD-10-CM | POA: Diagnosis not present

## 2017-06-21 DIAGNOSIS — R55 Syncope and collapse: Secondary | ICD-10-CM | POA: Diagnosis not present

## 2017-06-22 DIAGNOSIS — F0390 Unspecified dementia without behavioral disturbance: Secondary | ICD-10-CM | POA: Diagnosis not present

## 2017-06-22 DIAGNOSIS — Z85841 Personal history of malignant neoplasm of brain: Secondary | ICD-10-CM | POA: Diagnosis not present

## 2017-06-22 DIAGNOSIS — R55 Syncope and collapse: Secondary | ICD-10-CM | POA: Diagnosis not present

## 2017-06-22 DIAGNOSIS — M6281 Muscle weakness (generalized): Secondary | ICD-10-CM | POA: Diagnosis not present

## 2017-06-23 DIAGNOSIS — M6281 Muscle weakness (generalized): Secondary | ICD-10-CM | POA: Diagnosis not present

## 2017-06-23 DIAGNOSIS — F0390 Unspecified dementia without behavioral disturbance: Secondary | ICD-10-CM | POA: Diagnosis not present

## 2017-06-23 DIAGNOSIS — Z85841 Personal history of malignant neoplasm of brain: Secondary | ICD-10-CM | POA: Diagnosis not present

## 2017-06-23 DIAGNOSIS — R55 Syncope and collapse: Secondary | ICD-10-CM | POA: Diagnosis not present

## 2017-06-30 ENCOUNTER — Non-Acute Institutional Stay (SKILLED_NURSING_FACILITY): Payer: Medicare Other | Admitting: Adult Health

## 2017-06-30 ENCOUNTER — Encounter: Payer: Self-pay | Admitting: Adult Health

## 2017-06-30 DIAGNOSIS — E210 Primary hyperparathyroidism: Secondary | ICD-10-CM

## 2017-06-30 DIAGNOSIS — I951 Orthostatic hypotension: Secondary | ICD-10-CM | POA: Diagnosis not present

## 2017-06-30 DIAGNOSIS — I10 Essential (primary) hypertension: Secondary | ICD-10-CM

## 2017-06-30 DIAGNOSIS — F411 Generalized anxiety disorder: Secondary | ICD-10-CM

## 2017-06-30 NOTE — Progress Notes (Signed)
Location:  [facility] Room Number: [address] of Service:  SNF (31)   CODE STATUS: Full Code  Allergies  Allergen Reactions  . Cephalexin Nausea And Vomiting    Chief Complaint  Patient presents with  . Medical Management of Chronic Issues    Hypertension with orthostatic hypotension; hyperparathyroidism; anxiety.     HPI:  He is an 84 year old long term resident of this facility being seen for the management of his chronic illnesses: hypertension with orthostatic hypotension; hyperparathyroidism and anxiety. He is unable to fully participate in the hpi or ros. There are no reports of changes in appetite; no behavioral issues present; no reports of uncontrolled pain. There are no nursing concerns at this time.   Past Medical History:  Diagnosis Date  . Brain tumor ([place])   . Chronic constipation   . Headache(784.0)   . Hypercalcemia   . Hypercholesteremia   . Hypertension   . Insomnia   . Irregular heartbeat     Past Surgical History:  Procedure Laterality Date  . BRAIN SURGERY     x3  . SHOULDER SURGERY     right    Social History   Socioeconomic History  . Marital status: Married    Spouse name: Apolonio Schneiders  . Number of children: 2  . Years of education: 2nd  . Highest education level: Not on file  Social Needs  . Financial resource strain: Not on file  . Food insecurity - worry: Not on file  . Food insecurity - inability: Not on file  . Transportation needs - medical: Not on file  . Transportation needs - non-medical: Not on file  Occupational History  . Occupation: Retired  Tobacco Use  . Smoking status: Former Smoker    Years: 1.00    Types: Cigarettes    Last attempt to quit: 09/19/1976    Years since quitting: 40.8  . Smokeless tobacco: Never Used  Substance and Sexual Activity  . Alcohol use: No  . Drug use: No  . Sexual activity: Not on file  Other Topics Concern  . Not on file  Social History Narrative  . Not on file    Family History  Problem Relation Age of Onset  . Appendicitis Father   . AAA (abdominal aortic aneurysm) Father       VITAL SIGNS BP 128/70   Pulse 96   Temp 98.3 F (36.8 C)   Ht 5\' 6"  (1.676 m)   Wt 149 lb 12.8 oz (67.9 kg)   SpO2 98%   BMI 24.18 kg/m   Outpatient Encounter Medications as of 06/30/2017  Medication Sig  . acetaminophen (TYLENOL) 325 MG tablet Take 650 mg by mouth daily.   Marland Kitchen acetaminophen (TYLENOL) 325 MG tablet Take 650 mg by mouth every 6 (six) hours as needed for headache.  . donepezil (ARICEPT) 10 MG tablet Take 10 mg by mouth at bedtime.   Marland Kitchen HYDROcodone-acetaminophen (NORCO/VICODIN) 5-325 MG tablet Take 1 tablet by mouth every 8 (eight) hours as needed for moderate pain.  . memantine (NAMENDA XR) 28 MG CP24 24 hr capsule Take 28 mg by mouth daily.   No facility-administered encounter medications on file as of 06/30/2017.      SIGNIFICANT DIAGNOSTIC EXAMS  PREVIOUS  04-29-17: chest x-ray:  Mild bibasilar atelectasis.   04-29-17: ct of head and cervical spine:  No focal acute intracranial abnormality identified.  No acute fracture or dislocation of cervical spine.  NO NEW EXAMS  LABS REVIEWED: PREVIOUS   08-16-16: wbc 8.6; hgb 12.9; hct 37.9; mcv 96.6; plt 240; glucose 153; bun 15.7; creat 1.03; k+ 4.1; na++ 139; ca 12.3  tsh 1.02 09-21-16: PTH: 151.800 02-04-17: wbc 4.6; hgb 12.8; hct 39; plt 223; glucose 98; bun 24; creat 1.3; k+ 4.6 na++ 136  Liver normal    tsh 0.97 04-08-17: wbc 10.7; hgb 14.5; hct 42.8; mcv 93.2; plt 227; glucose 110; bun 20; creat 1.50; k+ 4.4; na++ 136; ca 11.7; liver normal albumin 3.9 04-29-17: wbc 9.3; hgb 14.1; hct 42.1; mcv 94.6; plt 210; glucose 126; bun 22; creat 1.66 ;k+ 4.6; na++ 137; ca 11.2; liver normal albumin 3.8; d-dimer: 0.48;  04-30-17: wbc6.2; hgb 13.2; hct 39.5; mcv 94.7; plt 219; glucose 90; bun 24; creat 1.51; k+ 4.7; na++ 137  PTH: 112  05-01-17: vit D 7.4   TODAY:   05-05-17: wbc 4.9; hgb 12.7;  hct 38.0; mcv 94.2; plt 203; glucose 95; bun 12.9; creat 1.03; k+ 4.3; na++ 140; ca 10.9; liver norma albumin 3.9  06-03-17: wbc 4.9; hgb 13.7; hct 42.1; mcv 96.4; plt 215; glucose 146; bun 21.5; creat 1.41; k+ 4.7; na++ 140; ca 12.0   Review of Systems  Unable to perform ROS: Dementia (confused )    Physical Exam  Constitutional: No distress.  Frail   Neck: No thyromegaly present.  Cardiovascular: Normal rate, regular rhythm and intact distal pulses.  Murmur heard. 1/6  Pulmonary/Chest: Effort normal and breath sounds normal. No respiratory distress.  Abdominal: Soft. Bowel sounds are normal. He exhibits no distension. There is no tenderness.  Lymphadenopathy:    He has no cervical adenopathy.  Skin: He is not diaphoretic.     ASSESSMENT/ PLAN:  TODAY:   1. Orthostatic hypotension: has essential hypertension: is stable: b/p128/70 is off medications;  will continue to monitor   2. Anxiety state:is stable  is currently off all medications will monitor his status   3. Primary hyperparathyroidism: unchanged:  PTH 112 will monitor  PREVIOUS   4. Brain tumor: unchanged:  is status post surgery X3; has headaches: will continue tylenol daily and every 6 hours as needed. Has vicodin 5/325 mg every 8 hours as needed for pain.   5. Vascular dementia without behavioral disturbance: without change: is on aricept 10 mg nightly and namenda xr 28 mg daily  His weight is 146 pounds will monitor   6. Hypercalcemia: without change ca level 12.0;  vit D 7.4         MD is aware of resident's narcotic use and is in agreement with current plan of care. We will attempt to wean resident as apropriate   Ok Edwards NP [facility]  Contact [phone] Monday through Friday 8am- 5pm  After hours call [phone]

## 2017-07-08 DIAGNOSIS — F0150 Vascular dementia without behavioral disturbance: Secondary | ICD-10-CM | POA: Diagnosis not present

## 2017-07-08 DIAGNOSIS — G301 Alzheimer's disease with late onset: Secondary | ICD-10-CM | POA: Diagnosis not present

## 2017-07-08 DIAGNOSIS — F0280 Dementia in other diseases classified elsewhere without behavioral disturbance: Secondary | ICD-10-CM | POA: Diagnosis not present

## 2017-07-08 DIAGNOSIS — F419 Anxiety disorder, unspecified: Secondary | ICD-10-CM | POA: Diagnosis not present

## 2017-07-26 DIAGNOSIS — G301 Alzheimer's disease with late onset: Secondary | ICD-10-CM | POA: Diagnosis not present

## 2017-07-26 DIAGNOSIS — F0150 Vascular dementia without behavioral disturbance: Secondary | ICD-10-CM | POA: Diagnosis not present

## 2017-07-28 ENCOUNTER — Non-Acute Institutional Stay (SKILLED_NURSING_FACILITY): Payer: Medicare Other | Admitting: Adult Health

## 2017-07-28 ENCOUNTER — Encounter: Payer: Self-pay | Admitting: Adult Health

## 2017-07-28 DIAGNOSIS — F0150 Vascular dementia without behavioral disturbance: Secondary | ICD-10-CM

## 2017-07-28 DIAGNOSIS — D496 Neoplasm of unspecified behavior of brain: Secondary | ICD-10-CM | POA: Diagnosis not present

## 2017-07-28 DIAGNOSIS — R519 Headache, unspecified: Secondary | ICD-10-CM

## 2017-07-28 DIAGNOSIS — R51 Headache: Secondary | ICD-10-CM | POA: Diagnosis not present

## 2017-07-28 NOTE — Progress Notes (Signed)
Location:   [facility] Room Number: [place] of Service:  SNF (31)   CODE STATUS: Full Code  Allergies  Allergen Reactions  . Cephalexin Nausea And Vomiting    Chief Complaint  Patient presents with  . Medical Management of Chronic Issues    Dementia; brain tumor; headaches;     HPI:  He is a 84 year old long term resident of this facility being seen for the management of his chronic illnesses: dementia; brain tumor; headaches. He is unable to fully participate in the hpi or ros. There are no reports of changes in appetite; changes in behaviors; or changes in weight. There are no nursing concerns at this time.   Past Medical History:  Diagnosis Date  . Brain tumor ([place])   . Chronic constipation   . Headache(784.0)   . Hypercalcemia   . Hypercholesteremia   . Hypertension   . Insomnia   . Irregular heartbeat     Past Surgical History:  Procedure Laterality Date  . BRAIN SURGERY     x3  . SHOULDER SURGERY     right    Social History   Socioeconomic History  . Marital status: Married    Spouse name: Apolonio Schneiders  . Number of children: 2  . Years of education: 2nd  . Highest education level: Not on file  Occupational History  . Occupation: Retired  Scientific laboratory technician  . Financial resource strain: Not on file  . Food insecurity:    Worry: Not on file    Inability: Not on file  . Transportation needs:    Medical: Not on file    Non-medical: Not on file  Tobacco Use  . Smoking status: Former Smoker    Years: 1.00    Types: Cigarettes    Last attempt to quit: 09/19/1976    Years since quitting: 40.8  . Smokeless tobacco: Never Used  Substance and Sexual Activity  . Alcohol use: No  . Drug use: No  . Sexual activity: Not on file  Lifestyle  . Physical activity:    Days per week: Not on file    Minutes per session: Not on file  . Stress: Not on file  Relationships  . Social connections:    Talks on phone: Not on file    Gets together: Not  on file    Attends religious service: Not on file    Active member of club or organization: Not on file    Attends meetings of clubs or organizations: Not on file    Relationship status: Not on file  . Intimate partner violence:    Fear of current or ex partner: Not on file    Emotionally abused: Not on file    Physically abused: Not on file    Forced sexual activity: Not on file  Other Topics Concern  . Not on file  Social History Narrative  . Not on file   Family History  Problem Relation Age of Onset  . Appendicitis Father   . AAA (abdominal aortic aneurysm) Father       VITAL SIGNS BP 118/64   Pulse 78   Temp 98.9 F (37.2 C)   Resp 18   Ht 5\' 6"  (1.676 m)   Wt 144 lb (65.3 kg)   SpO2 97%   BMI 23.24 kg/m   Outpatient Encounter Medications as of 07/28/2017  Medication Sig  . acetaminophen (TYLENOL) 325 MG tablet Take 650 mg by mouth daily.   Marland Kitchen  acetaminophen (TYLENOL) 325 MG tablet Take 650 mg by mouth every 6 (six) hours as needed for headache.  . donepezil (ARICEPT) 10 MG tablet Take 10 mg by mouth at bedtime.   Marland Kitchen HYDROcodone-acetaminophen (NORCO/VICODIN) 5-325 MG tablet Take 1 tablet by mouth every 8 (eight) hours as needed for moderate pain.  . memantine (NAMENDA XR) 28 MG CP24 24 hr capsule Take 28 mg by mouth daily.   No facility-administered encounter medications on file as of 07/28/2017.      SIGNIFICANT DIAGNOSTIC EXAMS  PREVIOUS  04-29-17: chest x-ray:  Mild bibasilar atelectasis.   04-29-17: ct of head and cervical spine:  No focal acute intracranial abnormality identified.  No acute fracture or dislocation of cervical spine.  NO NEW EXAMS      LABS REVIEWED: PREVIOUS   08-16-16: wbc 8.6; hgb 12.9; hct 37.9; mcv 96.6; plt 240; glucose 153; bun 15.7; creat 1.03; k+ 4.1; na++ 139; ca 12.3  tsh 1.02 09-21-16: PTH: 151.800 02-04-17: wbc 4.6; hgb 12.8; hct 39; plt 223; glucose 98; bun 24; creat 1.3; k+ 4.6 na++ 136  Liver normal    tsh  0.97 04-08-17: wbc 10.7; hgb 14.5; hct 42.8; mcv 93.2; plt 227; glucose 110; bun 20; creat 1.50; k+ 4.4; na++ 136; ca 11.7; liver normal albumin 3.9 04-29-17: wbc 9.3; hgb 14.1; hct 42.1; mcv 94.6; plt 210; glucose 126; bun 22; creat 1.66 ;k+ 4.6; na++ 137; ca 11.2; liver normal albumin 3.8; d-dimer: 0.48;  04-30-17: wbc6.2; hgb 13.2; hct 39.5; mcv 94.7; plt 219; glucose 90; bun 24; creat 1.51; k+ 4.7; na++ 137  PTH: 112  05-01-17: vit D 7.4 05-05-17: wbc 4.9; hgb 12.7; hct 38.0; mcv 94.2; plt 203; glucose 95; bun 12.9; creat 1.03; k+ 4.3; na++ 140; ca 10.9; liver norma albumin 3.9  06-03-17: wbc 4.9; hgb 13.7; hct 42.1; mcv 96.4; plt 215; glucose 146; bun 21.5; creat 1.41; k+ 4.7; na++ 140; ca 12.0   NO NEW LABS.    Review of Systems  Unable to perform ROS: Dementia (confused )    Physical Exam  Constitutional: No distress.  Frail   Neck: No thyromegaly present.  Cardiovascular: Normal rate, regular rhythm and intact distal pulses.  Murmur heard. 1/6  Pulmonary/Chest: Effort normal and breath sounds normal. No respiratory distress.  Abdominal: Soft. Bowel sounds are normal. He exhibits no distension. There is no tenderness.  Musculoskeletal: Normal range of motion. He exhibits no edema.  Lymphadenopathy:    He has no cervical adenopathy.  Neurological: He is alert.  Skin: Skin is warm and dry. He is not diaphoretic.  Psychiatric: He has a normal mood and affect.    ASSESSMENT/ PLAN:  TODAY:   1. Brain tumor: unchanged:  is status post surgery X3; has headaches: will continue tylenol daily and every 6 hours as needed. Has vicodin 5/325 mg every 8 hours as needed for pain.   2. Vascular dementia without behavioral disturbance: without change: is on aricept 10 mg nightly and namenda xr 28 mg daily  His weight is 144 pounds will monitor   3. Hypercalcemia: without change ca level 12.0;  vit D 7.4  PREVIOUS   4. Orthostatic hypotension: has essential hypertension: is stable:  b/p118/64 is off medications;  will continue to monitor   5. Anxiety state:is stable  is currently off all medications will monitor his status   6. Primary hyperparathyroidism: unchanged:  PTH 112 will monitor   MD is aware of resident's narcotic use and is in agreement  with current plan of care. We will attempt to wean resident as apropriate    Ok Edwards NP [facility]  Contact [phone] Monday through Friday 8am- 5pm  After hours call [phone]

## 2017-07-29 ENCOUNTER — Non-Acute Institutional Stay (SKILLED_NURSING_FACILITY): Payer: Medicare Other | Admitting: Adult Health

## 2017-07-29 ENCOUNTER — Encounter: Payer: Self-pay | Admitting: Adult Health

## 2017-07-29 DIAGNOSIS — Z87898 Personal history of other specified conditions: Secondary | ICD-10-CM

## 2017-07-29 DIAGNOSIS — E210 Primary hyperparathyroidism: Secondary | ICD-10-CM | POA: Diagnosis not present

## 2017-07-29 DIAGNOSIS — F0150 Vascular dementia without behavioral disturbance: Secondary | ICD-10-CM

## 2017-07-29 NOTE — Progress Notes (Signed)
Location:   [facility] Room Number: [place] of Service:  SNF (31)   CODE STATUS: Full Code (Most form updated 02/10/17)  Allergies  Allergen Reactions  . Cephalexin Nausea And Vomiting    Chief Complaint  Patient presents with  . Acute Visit    Care Plan Meeting    HPI:  We have come together for his routine care plan meeting. His daughter is present; he is unable to participate in the meeting. We did discuss his medical status; his medications. His MOST form is up to date. There are no nursing concerns. There are no reports of changes in appetite; no behavioral issues; no indications of pain present. He is telling his family that his head hurts daily. He does receive tylenol daily and has as needed dosing. Will change this to three times daily routinely.   Past Medical History:  Diagnosis Date  . Brain tumor ([place])   . Chronic constipation   . Headache(784.0)   . Hypercalcemia   . Hypercholesteremia   . Hypertension   . Insomnia   . Irregular heartbeat     Past Surgical History:  Procedure Laterality Date  . BRAIN SURGERY     x3  . SHOULDER SURGERY     right    Social History   Socioeconomic History  . Marital status: Married    Spouse name: Richard Mack  . Number of children: 2  . Years of education: 2nd  . Highest education level: Not on file  Occupational History  . Occupation: Retired  Scientific laboratory technician  . Financial resource strain: Not on file  . Food insecurity:    Worry: Not on file    Inability: Not on file  . Transportation needs:    Medical: Not on file    Non-medical: Not on file  Tobacco Use  . Smoking status: Former Smoker    Years: 1.00    Types: Cigarettes    Last attempt to quit: 09/19/1976    Years since quitting: 40.8  . Smokeless tobacco: Never Used  Substance and Sexual Activity  . Alcohol use: No  . Drug use: No  . Sexual activity: Not on file  Lifestyle  . Physical activity:    Days per week: Not on file   Minutes per session: Not on file  . Stress: Not on file  Relationships  . Social connections:    Talks on phone: Not on file    Gets together: Not on file    Attends religious service: Not on file    Active member of club or organization: Not on file    Attends meetings of clubs or organizations: Not on file    Relationship status: Not on file  . Intimate partner violence:    Fear of current or ex partner: Not on file    Emotionally abused: Not on file    Physically abused: Not on file    Forced sexual activity: Not on file  Other Topics Concern  . Not on file  Social History Narrative  . Not on file   Family History  Problem Relation Age of Onset  . Appendicitis Father   . AAA (abdominal aortic aneurysm) Father       VITAL SIGNS BP 118/64   Pulse 78   Temp 98.9 F (37.2 C)   Resp 18   Ht 5\' 6"  (1.676 m)   Wt 144 lb (65.3 kg)   SpO2 97%   BMI 23.24 kg/m  Outpatient Encounter Medications as of 07/29/2017  Medication Sig  . acetaminophen (TYLENOL) 325 MG tablet Take 650 mg by mouth daily.   Marland Kitchen acetaminophen (TYLENOL) 325 MG tablet Take 650 mg by mouth every 6 (six) hours as needed for headache.  . donepezil (ARICEPT) 10 MG tablet Take 10 mg by mouth at bedtime.   Marland Kitchen HYDROcodone-acetaminophen (NORCO/VICODIN) 5-325 MG tablet Take 1 tablet by mouth every 8 (eight) hours as needed for moderate pain.  . memantine (NAMENDA XR) 28 MG CP24 24 hr capsule Take 28 mg by mouth daily.   No facility-administered encounter medications on file as of 07/29/2017.      SIGNIFICANT DIAGNOSTIC EXAMS  PREVIOUS  04-29-17: chest x-ray:  Mild bibasilar atelectasis.   04-29-17: ct of head and cervical spine:  No focal acute intracranial abnormality identified.  No acute fracture or dislocation of cervical spine.  NO NEW EXAMS      LABS REVIEWED: PREVIOUS   08-16-16: wbc 8.6; hgb 12.9; hct 37.9; mcv 96.6; plt 240; glucose 153; bun 15.7; creat 1.03; k+ 4.1; na++ 139; ca 12.3  tsh  1.02 09-21-16: PTH: 151.800 02-04-17: wbc 4.6; hgb 12.8; hct 39; plt 223; glucose 98; bun 24; creat 1.3; k+ 4.6 na++ 136  Liver normal    tsh 0.97 04-08-17: wbc 10.7; hgb 14.5; hct 42.8; mcv 93.2; plt 227; glucose 110; bun 20; creat 1.50; k+ 4.4; na++ 136; ca 11.7; liver normal albumin 3.9 04-29-17: wbc 9.3; hgb 14.1; hct 42.1; mcv 94.6; plt 210; glucose 126; bun 22; creat 1.66 ;k+ 4.6; na++ 137; ca 11.2; liver normal albumin 3.8; d-dimer: 0.48;  04-30-17: wbc6.2; hgb 13.2; hct 39.5; mcv 94.7; plt 219; glucose 90; bun 24; creat 1.51; k+ 4.7; na++ 137  PTH: 112  05-01-17: vit D 7.4 05-05-17: wbc 4.9; hgb 12.7; hct 38.0; mcv 94.2; plt 203; glucose 95; bun 12.9; creat 1.03; k+ 4.3; na++ 140; ca 10.9; liver norma albumin 3.9  06-03-17: wbc 4.9; hgb 13.7; hct 42.1; mcv 96.4; plt 215; glucose 146; bun 21.5; creat 1.41; k+ 4.7; na++ 140; ca 12.0   NO NEW LABS.    Review of Systems  Unable to perform ROS: Dementia (confusion )    Physical Exam  Constitutional: No distress.  Frail   Neck: No thyromegaly present.  Cardiovascular: Normal rate, regular rhythm and intact distal pulses.  Murmur heard. 1/6  Pulmonary/Chest: Effort normal and breath sounds normal. No respiratory distress.  Abdominal: Soft. Bowel sounds are normal. He exhibits no distension. There is no tenderness.  Musculoskeletal: Normal range of motion. He exhibits no edema.  Lymphadenopathy:    He has no cervical adenopathy.  Neurological: He is alert.  Skin: Skin is warm and dry. He is not diaphoretic.  Psychiatric: He has a normal mood and affect.      ASSESSMENT/ PLAN:  TODAY:   1. Primary hyperparathyroidism 2. History of brain tumor 3. Vascular dementia without behavioral disturbance  Will continue his current plan of care Will begin tylenol 650 mg three times daily   Time spent with patient and family: 30 minutes: discussed medications; medical status; code status. Verbalized understanding.   MD is aware of  resident's narcotic use and is in agreement with current plan of care. We will attempt to wean resident as apropriate   Ok Edwards NP [facility]  Contact [phone] Monday through Friday 8am- 5pm  After hours call [phone]

## 2017-07-31 ENCOUNTER — Encounter: Payer: Self-pay | Admitting: Internal Medicine

## 2017-07-31 NOTE — Progress Notes (Signed)
Patient ID: Richard Mack, male   DOB: [birth year], 84 y.o.   MRN: [id number]  Location:  [facility]   Place of Service:  SNF (31) Provider:  Culpeper, Blossom, DO  Patient Care Team: Gildardo Cranker, DO as PCP - General (Internal Medicine) Nyoka Cowden Phylis Bougie, NP as Nurse Practitioner ([place], [place] ([place])  Extended Emergency Contact Information Primary Emergency Contact: Poth,[name] Address: [address]          [place], [place] [address] [name] of [place] Phone: [phone] Work Phone: [phone] Mobile Phone: [phone] Relation: Spouse  Code Status:   Goals of care: Advanced Directive information Advanced Directives 07/29/2017  Does Patient Have a Medical Advance Directive? Yes  Type of Advance Directive Out of facility DNR (pink MOST or yellow form)  Does patient want to make changes to medical advance directive? No - Patient declined  Would patient like information on creating a medical advance directive? -  Pre-existing out of facility DNR order (yellow form or pink MOST form) Pink MOST form placed in chart (order not valid for inpatient use)     Chief Complaint  Patient presents with  . Medical Management of Chronic Issues    30 day f/u    HPI:  Pt is a 84 y.o. male seen today for medical management of chronic diseases.  He c/o HA and feeling confused. No other concerns. No nursing issues. No f/c, CP, SOB. Appetite ok and sleeps well. He is a poor historian due to dementia. Hx obtained from chart.  Orthostatic hypotension with hx HTN - stable off meds  Anxiety - stable off meds  Brain tumor - s/p surgery x 3; stable. He has HA and takes Tylenol daily and every 6 hours as needed; vicodin 5/325 mg every 8 hours as needed for pain.   Vascular dementia - stable on aricept 10 mg nightly and namenda xr 28 mg daily. Weight is stable.    Hypercalcemia with hx primary  hyperPTHdism - stable. Ca 10.8;  Vitamin D 25OH level 7.4; PTH 112   Past Medical History:  Diagnosis Date  . Brain tumor ([place])   . Chronic constipation   . Headache(784.0)   . Hypercalcemia   . Hypercholesteremia   . Hypertension   . Insomnia   . Irregular heartbeat    Past Surgical History:  Procedure Laterality Date  . BRAIN SURGERY     x3  . SHOULDER SURGERY     right    Allergies  Allergen Reactions  . Cephalexin Nausea And Vomiting    Outpatient Encounter Medications as of 06/02/2017  Medication Sig  . acetaminophen (TYLENOL) 325 MG tablet Take 650 mg by mouth daily.   Marland Kitchen acetaminophen (TYLENOL) 325 MG tablet Take 650 mg by mouth every 6 (six) hours as needed for headache.  . donepezil (ARICEPT) 10 MG tablet Take 10 mg by mouth at bedtime.   Marland Kitchen HYDROcodone-acetaminophen (NORCO/VICODIN) 5-325 MG tablet Take 1 tablet by mouth every 8 (eight) hours as needed for moderate pain.  . memantine (NAMENDA XR) 28 MG CP24 24 hr capsule Take 28 mg by mouth daily.   No facility-administered encounter medications on file as of 06/02/2017.     Review of Systems  Unable to perform ROS: Dementia    Immunization History  Administered Date(s) Administered  . Influenza-Unspecified 02/05/2010, 02/18/2012, 03/12/2013, 02/14/2014  . PPD Test 08/17/2016  . Pneumococcal-Unspecified 11/06/2016  . Td 09/05/2011   Pertinent  Health Maintenance Due  Topic Date Due  . PNA vac Low Risk Adult (2 of 2 - PCV13) 11/06/2017  . INFLUENZA VACCINE  Discontinued   Fall Risk  05/15/2017 04/14/2017 11/04/2016  Falls in the past year? No No No   Functional Status Survey:    Vitals:   06/02/17 2134  BP: 128/70  Pulse: 96  Temp: 98.3 F (36.8 C)  SpO2: 98%  Weight: 155 lb 4.8 oz (70.4 kg)   Body mass index is 25.07 kg/m. Physical Exam  Constitutional: He appears well-developed.  Frail appearing in NAD  HENT:  Mouth/Throat: Oropharynx is clear and moist.  MMM; no oral thrush  Eyes:  Pupils are equal, round, and reactive to light. No scleral icterus.  Neck: Neck supple. Carotid bruit is not present. No thyromegaly present.  Cardiovascular: Regular rhythm and intact distal pulses. Tachycardia present. Exam reveals no gallop and no friction rub.  Murmur heard.  Systolic murmur is present with a grade of 1/6. No distal LE edema. No calf TTP  Pulmonary/Chest: Effort normal and breath sounds normal. He has no wheezes. He has no rales. He exhibits no tenderness.  Abdominal: Soft. Normal appearance and bowel sounds are normal. He exhibits no distension, no abdominal bruit, no pulsatile midline mass and no mass. There is no hepatomegaly. There is no tenderness. There is no rigidity, no rebound and no guarding. No hernia.  Musculoskeletal: He exhibits edema.  Lymphadenopathy:    He has no cervical adenopathy.  Neurological: He is alert.  Skin: Skin is warm and dry. No rash noted.  Psychiatric: He has a normal mood and affect. His behavior is normal.    Labs reviewed: Recent Labs    04/29/17 1944 04/30/17 0528 05/01/17 0413 06/03/17  NA 137 137 135 140  K 4.6 4.7 4.1 4.7  CL 104 106 105  --   CO2 23 22 23   --   GLUCOSE 126* 90 91  --   BUN 22* 24* 17 22*  CREATININE 1.66* 1.51* 1.24 1.4*  CALCIUM 11.2* 10.9* 10.8*  --    Recent Labs    02/04/17 04/08/17 1145 04/29/17 1944  AST 12* 20 19  ALT 10 13* 14*  ALKPHOS 102 125 113  BILITOT  --  0.7 0.6  PROT  --  7.0 6.8  ALBUMIN  --  3.9 3.8   Recent Labs    04/08/17 1145 04/29/17 1944 04/30/17 0528 05/05/17 06/03/17  WBC 10.7* 9.3 6.7 4.9 4.9  NEUTROABS 9.3* 8.0*  --  3  --   HGB 14.5 14.1 13.2 12.7* 13.7  HCT 42.8 42.1 39.5 38* 42  MCV 93.2 94.6 94.7  --   --   PLT 227 210 219 203 215   Lab Results  Component Value Date   TSH 0.97 02/04/2017   No results found for: HGBA1C No results found for: CHOL, HDL, LDLCALC, LDLDIRECT, TRIG, CHOLHDL  Significant Diagnostic Results in last 30 days:  No results  found.  Assessment/Plan   ICD-10-CM   1. Primary hyperparathyroidism ([place]) E21.0   2. Vascular dementia without behavioral disturbance F01.50   3. Chronic nonintractable headache, unspecified headache type R51    2/2 #4  4. Brain tumor ([place]) D49.6    Cont current meds as ordered  PT/OT/ST as indicated  Will follow  Labs/tests ordered: cbc, bmp   Jaquayla Hege S. Perlie Gold  [facility] [address] [place], [place] [address] (  [phone] Cell (Monday-Friday 8 AM - 5 PM) [phone] After 5 PM and follow prompts

## 2017-08-08 DIAGNOSIS — H25813 Combined forms of age-related cataract, bilateral: Secondary | ICD-10-CM | POA: Diagnosis not present

## 2017-08-08 DIAGNOSIS — H524 Presbyopia: Secondary | ICD-10-CM | POA: Diagnosis not present

## 2017-08-12 DIAGNOSIS — F0150 Vascular dementia without behavioral disturbance: Secondary | ICD-10-CM | POA: Diagnosis not present

## 2017-08-12 DIAGNOSIS — G301 Alzheimer's disease with late onset: Secondary | ICD-10-CM | POA: Diagnosis not present

## 2017-08-12 DIAGNOSIS — F419 Anxiety disorder, unspecified: Secondary | ICD-10-CM | POA: Diagnosis not present

## 2017-08-29 DIAGNOSIS — F0280 Dementia in other diseases classified elsewhere without behavioral disturbance: Secondary | ICD-10-CM | POA: Diagnosis not present

## 2017-08-29 DIAGNOSIS — G301 Alzheimer's disease with late onset: Secondary | ICD-10-CM | POA: Diagnosis not present

## 2017-08-29 DIAGNOSIS — F419 Anxiety disorder, unspecified: Secondary | ICD-10-CM | POA: Diagnosis not present

## 2017-08-29 DIAGNOSIS — F0150 Vascular dementia without behavioral disturbance: Secondary | ICD-10-CM | POA: Diagnosis not present

## 2017-08-30 ENCOUNTER — Encounter: Payer: Self-pay | Admitting: Adult Health

## 2017-08-30 ENCOUNTER — Non-Acute Institutional Stay (SKILLED_NURSING_FACILITY): Payer: Medicare Other | Admitting: Adult Health

## 2017-08-30 DIAGNOSIS — R51 Headache: Secondary | ICD-10-CM

## 2017-08-30 DIAGNOSIS — D496 Neoplasm of unspecified behavior of brain: Secondary | ICD-10-CM | POA: Diagnosis not present

## 2017-08-30 DIAGNOSIS — E210 Primary hyperparathyroidism: Secondary | ICD-10-CM | POA: Diagnosis not present

## 2017-08-30 DIAGNOSIS — I10 Essential (primary) hypertension: Secondary | ICD-10-CM | POA: Diagnosis not present

## 2017-08-30 DIAGNOSIS — G8929 Other chronic pain: Secondary | ICD-10-CM

## 2017-08-30 DIAGNOSIS — I951 Orthostatic hypotension: Secondary | ICD-10-CM | POA: Diagnosis not present

## 2017-08-30 DIAGNOSIS — F0150 Vascular dementia without behavioral disturbance: Secondary | ICD-10-CM | POA: Diagnosis not present

## 2017-08-30 NOTE — Progress Notes (Signed)
Provider:  Ok Edwards, NP Location:  [facility] Room Number: [place] of Service:  SNF (31)   PCP: Gildardo Cranker, DO Patient Care Team: Gildardo Cranker, DO as PCP - General (Internal Medicine) Nyoka Cowden Phylis Bougie, NP as Nurse Practitioner ([place]) [facility], [place] ([place])  Extended Emergency Contact Information Primary Emergency Contact: Tietze,[name] Address: [address]          [place], [place] [address] [name] of [place] Phone: [phone] Work Phone: [phone] Mobile Phone: [phone] Relation: Spouse  Code Status: Full Code (Most form updated 02/10/17) Goals of Care: Advanced Directive information Advanced Directives 08/30/2017  Does Patient Have a Medical Advance Directive? Yes  Type of Advance Directive Out of facility DNR (pink MOST or yellow form)  Does patient want to make changes to medical advance directive? No - Patient declined  Would patient like information on creating a medical advance directive? -  Pre-existing out of facility DNR order (yellow form or pink MOST form) Pink MOST form placed in chart (order not valid for inpatient use)      Allergies  Allergen Reactions  . Cephalexin Nausea And Vomiting     Chief Complaint  Patient presents with  . Annual Exam        HPI: Patient is a 84 y.o. male seen today for an annual comprehensive examination. He has been hospitalized over this past year for syncope. He has not had recent falls. He continues to be in the same room as his spouse. He is unable to participate in the hpi or ros. There are no reports of any changes in behavior; no changes in appetite; no uncontrolled pain. There are no nursing concerns at this time.   Past Medical History:  Diagnosis Date  . Brain tumor ([place])   . Chronic constipation   . Headache(784.0)   . Hypercalcemia   . Hypercholesteremia   . Hypertension   . Insomnia   . Irregular heartbeat     Past Surgical History:  Procedure Laterality Date  . BRAIN SURGERY     x3  . SHOULDER SURGERY     right    reports that he quit smoking about 40 years ago. His smoking use included cigarettes. He quit after 1.00 year of use. He has never used smokeless tobacco. He reports that he does not drink alcohol or use drugs. Social History   Socioeconomic History  . Marital status: Married    Spouse name: Apolonio Schneiders  . Number of children: 2  . Years of education: 2nd  . Highest education level: Not on file  Occupational History  . Occupation: Retired  Scientific laboratory technician  . Financial resource strain: Not on file  . Food insecurity:    Worry: Not on file    Inability: Not on file  . Transportation needs:    Medical: Not on file    Non-medical: Not on file  Tobacco Use  . Smoking status: Former Smoker    Years: 1.00    Types: Cigarettes    Last attempt to quit: 09/19/1976    Years since quitting: 40.9  . Smokeless tobacco: Never Used  Substance and Sexual Activity  . Alcohol use: No  . Drug use: No  . Sexual activity: Not on file  Lifestyle  . Physical activity:    Days per week: Not on file    Minutes per session: Not on file  . Stress: Not on file  Relationships  . Social connections:  Talks on phone: Not on file    Gets together: Not on file    Attends religious service: Not on file    Active member of club or organization: Not on file    Attends meetings of clubs or organizations: Not on file    Relationship status: Not on file  . Intimate partner violence:    Fear of current or ex partner: Not on file    Emotionally abused: Not on file    Physically abused: Not on file    Forced sexual activity: Not on file  Other Topics Concern  . Not on file  Social History Narrative  . Not on file   Family History  Problem Relation Age of Onset  . Appendicitis Father   . AAA (abdominal aortic aneurysm) Father     Vitals:   08/30/17 1017  BP: 136/68  Pulse: 75  Resp: (!) 22   Temp: 98.2 F (36.8 C)  SpO2: 98%  Weight: 144 lb 12.8 oz (65.7 kg)  Height: 5\' 6"  (1.676 m)   Body mass index is 23.37 kg/m.  Allergies as of 08/30/2017      Reactions   Cephalexin Nausea And Vomiting      Medication List        Accurate as of 08/30/17 10:28 AM. Always use your most recent med list.          acetaminophen 325 MG tablet Commonly known as:  TYLENOL Take 650 mg by mouth daily.   acetaminophen 325 MG tablet Commonly known as:  TYLENOL Take 650 mg by mouth every 6 (six) hours as needed for headache.   donepezil 10 MG tablet Commonly known as:  ARICEPT Take 10 mg by mouth at bedtime.   HYDROcodone-acetaminophen 5-325 MG tablet Commonly known as:  NORCO/VICODIN Take 1 tablet by mouth every 8 (eight) hours as needed for moderate pain.   NAMENDA XR 28 MG Cp24 24 hr capsule Generic drug:  memantine Take 28 mg by mouth daily.        SIGNIFICANT DIAGNOSTIC EXAMS PREVIOUS  04-29-17: chest x-ray:  Mild bibasilar atelectasis.   04-29-17: ct of head and cervical spine:  No focal acute intracranial abnormality identified.  No acute fracture or dislocation of cervical spine.  NO NEW EXAMS      LABS REVIEWED: PREVIOUS   08-16-16: wbc 8.6; hgb 12.9; hct 37.9; mcv 96.6; plt 240; glucose 153; bun 15.7; creat 1.03; k+ 4.1; na++ 139; ca 12.3  tsh 1.02 09-21-16: PTH: 151.800 02-04-17: wbc 4.6; hgb 12.8; hct 39; plt 223; glucose 98; bun 24; creat 1.3; k+ 4.6 na++ 136  Liver normal    tsh 0.97 04-08-17: wbc 10.7; hgb 14.5; hct 42.8; mcv 93.2; plt 227; glucose 110; bun 20; creat 1.50; k+ 4.4; na++ 136; ca 11.7; liver normal albumin 3.9 04-29-17: wbc 9.3; hgb 14.1; hct 42.1; mcv 94.6; plt 210; glucose 126; bun 22; creat 1.66 ;k+ 4.6; na++ 137; ca 11.2; liver normal albumin 3.8; d-dimer: 0.48;  04-30-17: wbc6.2; hgb 13.2; hct 39.5; mcv 94.7; plt 219; glucose 90; bun 24; creat 1.51; k+ 4.7; na++ 137  PTH: 112  05-01-17: vit D 7.4 05-05-17: wbc 4.9; hgb 12.7; hct 38.0;  mcv 94.2; plt 203; glucose 95; bun 12.9; creat 1.03; k+ 4.3; na++ 140; ca 10.9; liver norma albumin 3.9  06-03-17: wbc 4.9; hgb 13.7; hct 42.1; mcv 96.4; plt 215; glucose 146; bun 21.5; creat 1.41; k+ 4.7; na++ 140; ca 12.0   NO NEW LABS.  Review of Systems  Unable to perform ROS: Dementia (confusion )    Physical Exam  Constitutional: No distress.  Frail   HENT:  Mouth/Throat: Oropharynx is clear and moist.  Eyes: Conjunctivae are normal.  Neck: No thyromegaly present.  Cardiovascular: Normal rate, regular rhythm and intact distal pulses.  Murmur heard. 1/6  Pulmonary/Chest: Effort normal and breath sounds normal. No respiratory distress.  Abdominal: Soft. Bowel sounds are normal. He exhibits no distension. There is no tenderness.  Musculoskeletal: Normal range of motion. He exhibits no edema.  Lymphadenopathy:    He has no cervical adenopathy.  Neurological: He is alert.  Skin: Skin is warm and dry. He is not diaphoretic.  Psychiatric: He has a normal mood and affect.     ASSESSMENT/ PLAN:  TODAY:   1. Brain tumor: unchanged:  is status post surgery X3; has headaches: will continue tylenol 650 mg three times daily. Has vicodin 5/325 mg every 8 hours as needed for pain.   2. Vascular dementia without behavioral disturbance: without change: is on aricept 10 mg nightly and namenda xr 28 mg daily  His weight is 144 pounds will monitor   3. Hypercalcemia: without change ca level 12.0;  vit D 7.4  4. Orthostatic hypotension: has essential hypertension: is stable: b/p136/68 is off medications;  will continue to monitor   5. Anxiety state:is stable  is currently off all medications will monitor his status   6. Primary hyperparathyroidism: unchanged:  PTH 112 will monitor  His health maintenance is up to date will monitor   MD is aware of resident's narcotic use and is in agreement with current plan of care. We will wean dosage as appropriate for resident   Ok Edwards  NP [facility]  Contact [phone] Monday through Friday 8am- 5pm  After hours call [phone]

## 2017-08-31 ENCOUNTER — Other Ambulatory Visit: Payer: Self-pay

## 2017-08-31 MED ORDER — HYDROCODONE-ACETAMINOPHEN 5-325 MG PO TABS: 1.0000 | ORAL_TABLET | Freq: Three times a day (TID) | ORAL | 0 refills | Status: AC | PRN

## 2017-08-31 NOTE — Telephone Encounter (Signed)
Rx faxed to [facility] (p) [phone], (f) [phone]  

## 2017-09-07 DIAGNOSIS — Z85841 Personal history of malignant neoplasm of brain: Secondary | ICD-10-CM | POA: Diagnosis not present

## 2017-09-07 DIAGNOSIS — M6281 Muscle weakness (generalized): Secondary | ICD-10-CM | POA: Diagnosis not present

## 2017-09-07 DIAGNOSIS — F0390 Unspecified dementia without behavioral disturbance: Secondary | ICD-10-CM | POA: Diagnosis not present

## 2017-09-08 DIAGNOSIS — F0390 Unspecified dementia without behavioral disturbance: Secondary | ICD-10-CM | POA: Diagnosis not present

## 2017-09-08 DIAGNOSIS — M6281 Muscle weakness (generalized): Secondary | ICD-10-CM | POA: Diagnosis not present

## 2017-09-08 DIAGNOSIS — Z85841 Personal history of malignant neoplasm of brain: Secondary | ICD-10-CM | POA: Diagnosis not present

## 2017-09-09 DIAGNOSIS — F0390 Unspecified dementia without behavioral disturbance: Secondary | ICD-10-CM | POA: Diagnosis not present

## 2017-09-09 DIAGNOSIS — M6281 Muscle weakness (generalized): Secondary | ICD-10-CM | POA: Diagnosis not present

## 2017-09-09 DIAGNOSIS — Z85841 Personal history of malignant neoplasm of brain: Secondary | ICD-10-CM | POA: Diagnosis not present

## 2017-09-12 DIAGNOSIS — M6281 Muscle weakness (generalized): Secondary | ICD-10-CM | POA: Diagnosis not present

## 2017-09-12 DIAGNOSIS — F0390 Unspecified dementia without behavioral disturbance: Secondary | ICD-10-CM | POA: Diagnosis not present

## 2017-09-12 DIAGNOSIS — Z85841 Personal history of malignant neoplasm of brain: Secondary | ICD-10-CM | POA: Diagnosis not present

## 2017-09-13 DIAGNOSIS — F0390 Unspecified dementia without behavioral disturbance: Secondary | ICD-10-CM | POA: Diagnosis not present

## 2017-09-13 DIAGNOSIS — F0150 Vascular dementia without behavioral disturbance: Secondary | ICD-10-CM | POA: Diagnosis not present

## 2017-09-13 DIAGNOSIS — G301 Alzheimer's disease with late onset: Secondary | ICD-10-CM | POA: Diagnosis not present

## 2017-09-13 DIAGNOSIS — F419 Anxiety disorder, unspecified: Secondary | ICD-10-CM | POA: Diagnosis not present

## 2017-09-13 DIAGNOSIS — Z85841 Personal history of malignant neoplasm of brain: Secondary | ICD-10-CM | POA: Diagnosis not present

## 2017-09-13 DIAGNOSIS — M6281 Muscle weakness (generalized): Secondary | ICD-10-CM | POA: Diagnosis not present

## 2017-09-15 DIAGNOSIS — M6281 Muscle weakness (generalized): Secondary | ICD-10-CM | POA: Diagnosis not present

## 2017-09-15 DIAGNOSIS — F0390 Unspecified dementia without behavioral disturbance: Secondary | ICD-10-CM | POA: Diagnosis not present

## 2017-09-15 DIAGNOSIS — Z85841 Personal history of malignant neoplasm of brain: Secondary | ICD-10-CM | POA: Diagnosis not present

## 2017-09-16 DIAGNOSIS — F0390 Unspecified dementia without behavioral disturbance: Secondary | ICD-10-CM | POA: Diagnosis not present

## 2017-09-16 DIAGNOSIS — Z85841 Personal history of malignant neoplasm of brain: Secondary | ICD-10-CM | POA: Diagnosis not present

## 2017-09-16 DIAGNOSIS — M6281 Muscle weakness (generalized): Secondary | ICD-10-CM | POA: Diagnosis not present

## 2017-09-18 DIAGNOSIS — M6281 Muscle weakness (generalized): Secondary | ICD-10-CM | POA: Diagnosis not present

## 2017-09-18 DIAGNOSIS — F0390 Unspecified dementia without behavioral disturbance: Secondary | ICD-10-CM | POA: Diagnosis not present

## 2017-09-18 DIAGNOSIS — Z85841 Personal history of malignant neoplasm of brain: Secondary | ICD-10-CM | POA: Diagnosis not present

## 2017-09-19 DIAGNOSIS — F0390 Unspecified dementia without behavioral disturbance: Secondary | ICD-10-CM | POA: Diagnosis not present

## 2017-09-19 DIAGNOSIS — Z85841 Personal history of malignant neoplasm of brain: Secondary | ICD-10-CM | POA: Diagnosis not present

## 2017-09-19 DIAGNOSIS — M6281 Muscle weakness (generalized): Secondary | ICD-10-CM | POA: Diagnosis not present

## 2017-09-20 DIAGNOSIS — M6281 Muscle weakness (generalized): Secondary | ICD-10-CM | POA: Diagnosis not present

## 2017-09-20 DIAGNOSIS — F0390 Unspecified dementia without behavioral disturbance: Secondary | ICD-10-CM | POA: Diagnosis not present

## 2017-09-20 DIAGNOSIS — Z85841 Personal history of malignant neoplasm of brain: Secondary | ICD-10-CM | POA: Diagnosis not present

## 2017-09-21 DIAGNOSIS — M6281 Muscle weakness (generalized): Secondary | ICD-10-CM | POA: Diagnosis not present

## 2017-09-21 DIAGNOSIS — B351 Tinea unguium: Secondary | ICD-10-CM | POA: Diagnosis not present

## 2017-09-21 DIAGNOSIS — F0390 Unspecified dementia without behavioral disturbance: Secondary | ICD-10-CM | POA: Diagnosis not present

## 2017-09-21 DIAGNOSIS — Z85841 Personal history of malignant neoplasm of brain: Secondary | ICD-10-CM | POA: Diagnosis not present

## 2017-09-21 DIAGNOSIS — I739 Peripheral vascular disease, unspecified: Secondary | ICD-10-CM | POA: Diagnosis not present

## 2017-09-22 DIAGNOSIS — F0390 Unspecified dementia without behavioral disturbance: Secondary | ICD-10-CM | POA: Diagnosis not present

## 2017-09-22 DIAGNOSIS — Z85841 Personal history of malignant neoplasm of brain: Secondary | ICD-10-CM | POA: Diagnosis not present

## 2017-09-22 DIAGNOSIS — M6281 Muscle weakness (generalized): Secondary | ICD-10-CM | POA: Diagnosis not present

## 2017-09-25 DIAGNOSIS — M6281 Muscle weakness (generalized): Secondary | ICD-10-CM | POA: Diagnosis not present

## 2017-09-25 DIAGNOSIS — F0390 Unspecified dementia without behavioral disturbance: Secondary | ICD-10-CM | POA: Diagnosis not present

## 2017-09-25 DIAGNOSIS — Z85841 Personal history of malignant neoplasm of brain: Secondary | ICD-10-CM | POA: Diagnosis not present

## 2017-09-29 ENCOUNTER — Encounter: Payer: Self-pay | Admitting: Internal Medicine

## 2017-09-29 ENCOUNTER — Non-Acute Institutional Stay (SKILLED_NURSING_FACILITY): Payer: Medicare Other | Admitting: Internal Medicine

## 2017-09-29 DIAGNOSIS — R51 Headache: Secondary | ICD-10-CM

## 2017-09-29 DIAGNOSIS — Z87898 Personal history of other specified conditions: Secondary | ICD-10-CM

## 2017-09-29 DIAGNOSIS — I951 Orthostatic hypotension: Secondary | ICD-10-CM

## 2017-09-29 DIAGNOSIS — F0150 Vascular dementia without behavioral disturbance: Secondary | ICD-10-CM | POA: Diagnosis not present

## 2017-09-29 DIAGNOSIS — G8929 Other chronic pain: Secondary | ICD-10-CM

## 2017-09-29 NOTE — Progress Notes (Signed)
Patient ID: Richard Mack, male   DOB: 16-Jul-1928, 84 y.o.   MRN: [id number]  Location:  [facility] Room Number: [place] of Service:  SNF [phone]) Provider:  Tucker, [place], DO  Patient Care Team: Gildardo Cranker, DO as PCP - General (Internal Medicine) Nyoka Cowden Phylis Bougie, NP as Nurse Practitioner ([place]) [facility] [place] ([place])  Extended Emergency Contact Information Primary Emergency Contact: Soliman,[name] Address: [address]          [place], [place] [address] [name] of [place] Phone: [phone] Work Phone: [phone] Mobile Phone: [phone] Relation: Spouse  Code Status:  Full Code Goals of care: Advanced Directive information Advanced Directives 09/29/2017  Does Patient Have a Medical Advance Directive? Yes  Type of Advance Directive Out of facility DNR (pink MOST or yellow form)  Does patient want to make changes to medical advance directive? No - Patient declined  Would patient like information on creating a medical advance directive? -  Pre-existing out of facility DNR order (yellow form or pink MOST form) Pink MOST form placed in chart (order not valid for inpatient use)     Chief Complaint  Patient presents with  . Medical Management of Chronic Issues    1 month follow up    HPI:  Pt is a 84 y.o. male seen today for medical management of chronic diseases.  He c/o HA which is chronic and there has not been a change in intensity. He has leg pain and decreased appetite. He has poor hygiene and does not take baths/showers when time. No other nursing concerns. He is a poor historian due to dementia. Hx obtained from chart   Hx Brain tumor - s/p surgery X 3; he has chronic headaches and takes tylenol 650 mg three times daily; vicodin 5/325 mg every 8 hours as needed for pain.   Vascular dementia - no behavioral disturbance; takes aricept 10 mg nightly and namenda xr 28 mg daily.  Weight up 2 lbs  Hypercalcemia 2/2 primary hyperPTHdism - stable. Ca 2+ 10.8; Vitamin D 25OH level 7.4; PTH 112  Orthostatic hypotension: with hx HTN - stable. He does not take any meds  Anxiety - stable. He does not take any meds   Past Medical History:  Diagnosis Date  . Brain tumor ([place])   . Chronic constipation   . Headache(784.0)   . Hypercalcemia   . Hypercholesteremia   . Hypertension   . Insomnia   . Irregular heartbeat    Past Surgical History:  Procedure Laterality Date  . BRAIN SURGERY     x3  . SHOULDER SURGERY     right    Allergies  Allergen Reactions  . Cephalexin Nausea And Vomiting    Outpatient Encounter Medications as of 09/29/2017  Medication Sig  . acetaminophen (TYLENOL) 325 MG tablet Take 650 mg by mouth daily.   Marland Kitchen acetaminophen (TYLENOL) 325 MG tablet Take 650 mg by mouth every 6 (six) hours as needed for headache.  . donepezil (ARICEPT) 10 MG tablet Take 10 mg by mouth at bedtime.   Marland Kitchen HYDROcodone-acetaminophen (NORCO/VICODIN) 5-325 MG tablet Take 1 tablet by mouth every 8 (eight) hours as needed for moderate pain.  . memantine (NAMENDA XR) 28 MG CP24 24 hr capsule Take 28 mg by mouth daily.  . Nutritional Supplements (NUTRITIONAL SUPPLEMENT PO) NAS (No salt added) diet.  Mechanical soft texture, Regular / thin consistency   No facility-administered encounter medications on  file as of 09/29/2017.     Review of Systems  Unable to perform ROS: Dementia    Immunization History  Administered Date(s) Administered  . Influenza-Unspecified 02/05/2010, 02/18/2012, 03/12/2013, 02/14/2014  . PPD Test 08/17/2016  . Pneumococcal-Unspecified 11/06/2016  . Td 09/05/2011   Pertinent  Health Maintenance Due  Topic Date Due  . PNA vac Low Risk Adult (2 of 2 - PCV13) 11/06/2017  . INFLUENZA VACCINE  Discontinued   Fall Risk  05/15/2017 04/14/2017 11/04/2016  Falls in the past year? No No No   Functional Status Survey:    Vitals:   09/29/17 1222    BP: 119/85  Pulse: 85  Resp: 18  Temp: 98.1 F (36.7 C)  SpO2: 96%  Weight: 146 lb (66.2 kg)  Height: 5\' 6"  (1.676 m)   Body mass index is 23.57 kg/m. Physical Exam  Constitutional: He is oriented to person, place, and time. He appears well-developed and well-nourished.  Frail appearing in NAD, lying in bed; he has a body odor  HENT:  Mouth/Throat: Oropharynx is clear and moist.  MMM; no oral thrush  Eyes: Pupils are equal, round, and reactive to light. No scleral icterus.  Neck: Neck supple. Carotid bruit is not present. No thyromegaly present.  Cardiovascular: Normal rate, regular rhythm and intact distal pulses. Exam reveals no gallop and no friction rub.  Murmur (1/6 SEM) heard. Trace LE edema b/l. No calf TTP  Pulmonary/Chest: Effort normal and breath sounds normal. He has no wheezes. He has no rales. He exhibits no tenderness.  Abdominal: Soft. Bowel sounds are normal. He exhibits no distension, no abdominal bruit, no pulsatile midline mass and no mass. There is no hepatomegaly. There is no tenderness. There is no rebound and no guarding.  Musculoskeletal: He exhibits edema (small and large joint).  Lymphadenopathy:    He has no cervical adenopathy.  Neurological: He is alert and oriented to person, place, and time. He has normal reflexes.  Skin: Skin is warm and dry. No rash noted.  Psychiatric: He has a normal mood and affect. His behavior is normal. Judgment and thought content normal.    Labs reviewed: Recent Labs    04/29/17 1944 04/30/17 0528 05/01/17 0413 06/03/17  NA 137 137 135 140  K 4.6 4.7 4.1 4.7  CL 104 106 105  --   CO2 23 22 23   --   GLUCOSE 126* 90 91  --   BUN 22* 24* 17 22*  CREATININE 1.66* 1.51* 1.24 1.4*  CALCIUM 11.2* 10.9* 10.8*  --    Recent Labs    02/04/17 04/08/17 1145 04/29/17 1944  AST 12* 20 19  ALT 10 13* 14*  ALKPHOS 102 125 113  BILITOT  --  0.7 0.6  PROT  --  7.0 6.8  ALBUMIN  --  3.9 3.8   Recent Labs     04/08/17 1145 04/29/17 1944 04/30/17 0528 05/05/17 06/03/17  WBC 10.7* 9.3 6.7 4.9 4.9  NEUTROABS 9.3* 8.0*  --  3  --   HGB 14.5 14.1 13.2 12.7* 13.7  HCT 42.8 42.1 39.5 38* 42  MCV 93.2 94.6 94.7  --   --   PLT 227 210 219 203 215   Lab Results  Component Value Date   TSH 0.97 02/04/2017   No results found for: HGBA1C No results found for: CHOL, HDL, LDLCALC, LDLDIRECT, TRIG, CHOLHDL  Significant Diagnostic Results in last 30 days:  No results found.  Assessment/Plan   ICD-10-CM  1. Vascular dementia without behavioral disturbance F01.50   2. Chronic nonintractable headache, unspecified headache type R51   3. History of brain tumor Z87.898   4. Orthostatic hypotension I95.1     Cont current meds as ordered  PT/OT/ST as indicated  Discussed importance of proper hygiene  Will follow  Labs/tests ordered: none   Indria Bishara S. Perlie Gold  [facility] [address] [place], [place] [address] [phone] Cell (Monday-Friday 8 AM - 5 PM) [phone] After 5 PM and follow prompts

## 2017-10-25 DIAGNOSIS — G301 Alzheimer's disease with late onset: Secondary | ICD-10-CM | POA: Diagnosis not present

## 2017-10-25 DIAGNOSIS — F419 Anxiety disorder, unspecified: Secondary | ICD-10-CM | POA: Diagnosis not present

## 2017-10-25 DIAGNOSIS — F0150 Vascular dementia without behavioral disturbance: Secondary | ICD-10-CM | POA: Diagnosis not present

## 2017-10-27 ENCOUNTER — Encounter: Payer: Self-pay | Admitting: Adult Health

## 2017-10-27 ENCOUNTER — Non-Acute Institutional Stay (SKILLED_NURSING_FACILITY): Payer: Medicare Other | Admitting: Adult Health

## 2017-10-27 DIAGNOSIS — F0150 Vascular dementia without behavioral disturbance: Secondary | ICD-10-CM

## 2017-10-27 DIAGNOSIS — D496 Neoplasm of unspecified behavior of brain: Secondary | ICD-10-CM | POA: Diagnosis not present

## 2017-10-27 NOTE — Progress Notes (Signed)
Location:   [facility] Room Number: [place] of Service:  SNF (31)   CODE STATUS: Full Code (Most form updated 02/10/17)  Allergies  Allergen Reactions  . Cephalexin Nausea And Vomiting    Chief Complaint  Patient presents with  . Medical Management of Chronic Issues    Brain tumor; hypercalcemia; dementia    HPI:  He is a 84 year old long term resident of this facility being seen for the management of his chronic illnesses: brain tumor; hypercalcemia; dementia. He is unable to fully participate in the hpi or ros. There are no reports of changes in appetite; no reports of changes in appetite; no anxiety or agitation. There are no nursing concerns at this time.    Past Medical History:  Diagnosis Date  . Brain tumor ([place])   . Chronic constipation   . Headache(784.0)   . Hypercalcemia   . Hypercholesteremia   . Hypertension   . Insomnia   . Irregular heartbeat     Past Surgical History:  Procedure Laterality Date  . BRAIN SURGERY     x3  . SHOULDER SURGERY     right    Social History   Socioeconomic History  . Marital status: Married    Spouse name: Apolonio Schneiders  . Number of children: 2  . Years of education: 2nd  . Highest education level: Not on file  Occupational History  . Occupation: Retired  Scientific laboratory technician  . Financial resource strain: Not on file  . Food insecurity:    Worry: Not on file    Inability: Not on file  . Transportation needs:    Medical: Not on file    Non-medical: Not on file  Tobacco Use  . Smoking status: Former Smoker    Years: 1.00    Types: Cigarettes    Last attempt to quit: 09/19/1976    Years since quitting: 41.1  . Smokeless tobacco: Never Used  Substance and Sexual Activity  . Alcohol use: No  . Drug use: No  . Sexual activity: Not on file  Lifestyle  . Physical activity:    Days per week: Not on file    Minutes per session: Not on file  . Stress: Not on file  Relationships  . Social connections:    Talks on phone: Not on file    Gets together: Not on file    Attends religious service: Not on file    Active member of club or organization: Not on file    Attends meetings of clubs or organizations: Not on file    Relationship status: Not on file  . Intimate partner violence:    Fear of current or ex partner: Not on file    Emotionally abused: Not on file    Physically abused: Not on file    Forced sexual activity: Not on file  Other Topics Concern  . Not on file  Social History Narrative  . Not on file   Family History  Problem Relation Age of Onset  . Appendicitis Father   . AAA (abdominal aortic aneurysm) Father       VITAL SIGNS BP 124/80   Pulse 78   Temp 98.6 F (37 C)   Resp 20   Ht 5\' 6"  (1.676 m)   Wt 143 lb 1.6 oz (64.9 kg)   SpO2 94%   BMI 23.10 kg/m   Outpatient Encounter Medications as of 10/27/2017  Medication Sig  . acetaminophen (TYLENOL) 325 MG  tablet Take 650 mg by mouth daily.   Marland Kitchen acetaminophen (TYLENOL) 325 MG tablet Take 650 mg by mouth every 6 (six) hours as needed for headache.  . donepezil (ARICEPT) 10 MG tablet Take 10 mg by mouth at bedtime.   Marland Kitchen HYDROcodone-acetaminophen (NORCO/VICODIN) 5-325 MG tablet Take 1 tablet by mouth every 8 (eight) hours as needed for moderate pain.  . memantine (NAMENDA XR) 28 MG CP24 24 hr capsule Take 28 mg by mouth daily.  . Nutritional Supplements (NUTRITIONAL SUPPLEMENT PO) NAS (No salt added) diet.  Mechanical soft texture, Regular / thin consistency   No facility-administered encounter medications on file as of 10/27/2017.      SIGNIFICANT DIAGNOSTIC EXAMS  PREVIOUS  04-29-17: chest x-ray:  Mild bibasilar atelectasis.   04-29-17: ct of head and cervical spine:  No focal acute intracranial abnormality identified.  No acute fracture or dislocation of cervical spine.  NO NEW EXAMS      LABS REVIEWED: PREVIOUS   02-04-17: wbc 4.6; hgb 12.8; hct 39; plt 223; glucose 98; bun 24; creat 1.3; k+ 4.6  na++ 136  Liver normal    tsh 0.97 04-08-17: wbc 10.7; hgb 14.5; hct 42.8; mcv 93.2; plt 227; glucose 110; bun 20; creat 1.50; k+ 4.4; na++ 136; ca 11.7; liver normal albumin 3.9 04-29-17: wbc 9.3; hgb 14.1; hct 42.1; mcv 94.6; plt 210; glucose 126; bun 22; creat 1.66 ;k+ 4.6; na++ 137; ca 11.2; liver normal albumin 3.8; d-dimer: 0.48;  04-30-17: wbc6.2; hgb 13.2; hct 39.5; mcv 94.7; plt 219; glucose 90; bun 24; creat 1.51; k+ 4.7; na++ 137  PTH: 112  05-01-17: vit D 7.4 05-05-17: wbc 4.9; hgb 12.7; hct 38.0; mcv 94.2; plt 203; glucose 95; bun 12.9; creat 1.03; k+ 4.3; na++ 140; ca 10.9; liver norma albumin 3.9  06-03-17: wbc 4.9; hgb 13.7; hct 42.1; mcv 96.4; plt 215; glucose 146; bun 21.5; creat 1.41; k+ 4.7; na++ 140; ca 12.0   NO NEW LABS.    Review of Systems  Unable to perform ROS: Dementia (confusion )    Physical Exam  Constitutional: No distress.  Frail   Neck: No thyromegaly present.  Cardiovascular: Normal rate, regular rhythm and intact distal pulses.  Murmur heard. 1/6  Pulmonary/Chest: Effort normal and breath sounds normal. No respiratory distress.  Abdominal: Soft. Bowel sounds are normal. He exhibits no distension. There is no tenderness.  Musculoskeletal: Normal range of motion. He exhibits no edema.  Lymphadenopathy:    He has no cervical adenopathy.  Neurological: He is alert.  Skin: Skin is warm and dry. He is not diaphoretic.  Psychiatric: He has a normal mood and affect.      ASSESSMENT/ PLAN:  TODAY:   1. Brain tumor: unchanged:  is status post surgery X3; has headaches: will continue tylenol 650 mg three times daily. Has vicodin 5/325 mg every 8 hours as needed for pain.   2. Vascular dementia without behavioral disturbance: without change: is on aricept 10 mg nightly and namenda xr 28 mg daily  His weight is 143 pounds will monitor   3. Hypercalcemia: without change ca level 12.0;  vit D 7.4  PREVIOUS   4. Orthostatic hypotension: has essential  hypertension: is stable: b/p124/80 is off medications;  will continue to monitor   5. Anxiety state:is stable  is currently off all medications will monitor his status   6. Primary hyperparathyroidism: unchanged:  PTH 112 will monitor   MD is aware of resident's narcotic use and is in  agreement with current plan of care. We will attempt to wean resident as apropriate   Ok Edwards NP [facility]  Contact [phone] Monday through Friday 8am- 5pm  After hours call [phone]

## 2017-11-10 ENCOUNTER — Non-Acute Institutional Stay (SKILLED_NURSING_FACILITY): Payer: Medicare Other

## 2017-11-10 DIAGNOSIS — Z0000 Encounter for general adult medical examination without abnormal findings: Secondary | ICD-10-CM | POA: Diagnosis not present

## 2017-11-10 NOTE — Progress Notes (Addendum)
Subjective:   Richard Mack is a 84 y.o. male who presents for Medicare Annual/Subsequent preventive examination at [facility]  Last AWV-11/04/2016     Objective:    Vitals: BP 123/80 (BP Location: Right Arm, Patient Position: Supine)   Pulse 78   Temp 98.6 F (37 C) (Oral)   Ht 5\' 6"  (1.676 m)   Wt 143 lb (64.9 kg)   BMI 23.08 kg/m   Body mass index is 23.08 kg/m.  Advanced Directives 11/10/2017 10/27/2017 09/29/2017 08/30/2017 07/29/2017 07/28/2017 06/30/2017  Does Patient Have a Medical Advance Directive? Yes Yes Yes Yes Yes Yes No  Type of Advance Directive Out of facility DNR (pink MOST or yellow form) Out of facility DNR (pink MOST or yellow form) Out of facility DNR (pink MOST or yellow form) Out of facility DNR (pink MOST or yellow form) Out of facility DNR (pink MOST or yellow form) Out of facility DNR (pink MOST or yellow form) -  Does patient want to make changes to medical advance directive? No - Patient declined No - Patient declined No - Patient declined No - Patient declined No - Patient declined No - Patient declined No - Patient declined  Would patient like information on creating a medical advance directive? - - - - - - -  Pre-existing out of facility DNR order (yellow form or pink MOST form) Pink MOST form placed in chart (order not valid for inpatient use) Pink MOST form placed in chart (order not valid for inpatient use) Pink MOST form placed in chart (order not valid for inpatient use) Pink MOST form placed in chart (order not valid for inpatient use) Pink MOST form placed in chart (order not valid for inpatient use) Pink MOST form placed in chart (order not valid for inpatient use) -    Tobacco Social History   Tobacco Use  Smoking Status Former Smoker  . Years: 1.00  . Types: Cigarettes  . Last attempt to quit: 09/19/1976  . Years since quitting: 41.1  Smokeless Tobacco Never Used     Counseling given: Not Answered   Clinical  Intake:  Pre-visit preparation completed: No  Pain : 0-10 Pain Score: 2  Pain Type: Chronic pain Pain Location: Head Pain Orientation: Anterior Pain Descriptors / Indicators: Aching     Nutritional Risks: None Diabetes: No  How often do you need to have someone help you when you read instructions, pamphlets, or other written materials from your doctor or pharmacy?: 2 - Rarely What is the last grade level you completed in school?: Trade school  Interpreter Needed?: No  Information entered by :: Tyson Dense, RN  Past Medical History:  Diagnosis Date  . Brain tumor ([place])   . Chronic constipation   . Headache(784.0)   . Hypercalcemia   . Hypercholesteremia   . Hypertension   . Insomnia   . Irregular heartbeat    Past Surgical History:  Procedure Laterality Date  . BRAIN SURGERY     x3  . SHOULDER SURGERY     right   Family History  Problem Relation Age of Onset  . Appendicitis Father   . AAA (abdominal aortic aneurysm) Father    Social History   Socioeconomic History  . Marital status: Married    Spouse name: Apolonio Schneiders  . Number of children: 2  . Years of education: 2nd  . Highest education level: Not on file  Occupational History  . Occupation: Retired  Scientific laboratory technician  . Financial  resource strain: Not hard at all  . Food insecurity:    Worry: Never true    Inability: Never true  . Transportation needs:    Medical: No    Non-medical: No  Tobacco Use  . Smoking status: Former Smoker    Years: 1.00    Types: Cigarettes    Last attempt to quit: 09/19/1976    Years since quitting: 41.1  . Smokeless tobacco: Never Used  Substance and Sexual Activity  . Alcohol use: No  . Drug use: No  . Sexual activity: Not on file  Lifestyle  . Physical activity:    Days per week: 5 days    Minutes per session: 20 min  . Stress: Not at all  Relationships  . Social connections:    Talks on phone: Once a week    Gets together: Once a week    Attends religious  service: Never    Active member of club or organization: No    Attends meetings of clubs or organizations: Never    Relationship status: Married  Other Topics Concern  . Not on file  Social History Narrative  . Not on file    Outpatient Encounter Medications as of 11/10/2017  Medication Sig  . acetaminophen (TYLENOL) 325 MG tablet Take 650 mg by mouth daily.   Marland Kitchen acetaminophen (TYLENOL) 325 MG tablet Take 650 mg by mouth every 6 (six) hours as needed for headache.  . donepezil (ARICEPT) 10 MG tablet Take 10 mg by mouth at bedtime.   Marland Kitchen HYDROcodone-acetaminophen (NORCO/VICODIN) 5-325 MG tablet Take 1 tablet by mouth every 8 (eight) hours as needed for moderate pain.  . memantine (NAMENDA XR) 28 MG CP24 24 hr capsule Take 28 mg by mouth daily.  . Nutritional Supplements (NUTRITIONAL SUPPLEMENT PO) NAS (No salt added) diet.  Mechanical soft texture, Regular / thin consistency   No facility-administered encounter medications on file as of 11/10/2017.     Activities of Daily Living In your present state of health, do you have any difficulty performing the following activities: 11/10/2017 04/30/2017  Hearing? Y N  Vision? N N  Difficulty concentrating or making decisions? Tempie Donning  Walking or climbing stairs? Y N  Dressing or bathing? Y N  Doing errands, shopping? Tempie Donning  Preparing Food and eating ? Y -  Using the Toilet? N -  In the past six months, have you accidently leaked urine? N -  Do you have problems with loss of bowel control? N -  Managing your Medications? Y -  Managing your Finances? Y -  Housekeeping or managing your Housekeeping? Y -  Some recent data might be hidden    Patient Care Team: Gildardo Cranker, DO as PCP - General (Internal Medicine) Nyoka Cowden Phylis Bougie, NP as Nurse Practitioner ([facility] [place] [facility])   Assessment:   This is a routine wellness examination for [place].  Exercise Activities and Dietary  recommendations Current Exercise Habits: Home exercise routine, Type of exercise: walking, Time (Minutes): 20, Frequency (Times/Week): 5, Weekly Exercise (Minutes/Week): 100, Intensity: Mild, Exercise limited by: neurologic condition(s);orthopedic condition(s)  Goals    None      Fall Risk Fall Risk  11/10/2017 05/15/2017 04/14/2017 11/04/2016  Falls in the past year? No No No No   Is the patient's home free of loose throw rugs in walkways, pet beds, electrical cords, etc?   yes      Grab bars in the bathroom? yes  Handrails on the stairs?   yes      Adequate lighting?   yes  Depression Screen PHQ 2/9 Scores 11/10/2017 05/15/2017 11/04/2016  PHQ - 2 Score 0 0 0    Cognitive Function MMSE - Mini Mental State Exam 05/15/2017  Not completed: Unable to complete     6CIT Screen 11/10/2017 11/04/2016  What Year? 4 points 4 points  What month? 3 points 3 points  What time? 0 points 0 points  Count back from 20 4 points 4 points  Months in reverse 4 points 4 points  Repeat phrase 10 points 10 points  Total Score 25 25    Immunization History  Administered Date(s) Administered  . Influenza-Unspecified 02/05/2010, 02/18/2012, 03/12/2013, 02/14/2014  . PPD Test 08/17/2016  . Pneumococcal-Unspecified 11/06/2016  . Td 09/05/2011    Qualifies for Shingles Vaccine? Not in past records  Screening Tests Health Maintenance  Topic Date Due  . PNA vac Low Risk Adult (2 of 2 - PCV13) 11/06/2017  . TETANUS/TDAP  09/04/2021  . INFLUENZA VACCINE  Discontinued   Cancer Screenings: Lung: Low Dose CT Chest recommended if Age 75-80 years, 30 pack-year currently smoking OR have quit w/in 15years. Patient does not qualify. Colorectal: up to date  Additional Screenings:  Hepatitis C Screening:declined  Prevnar due: ordered    Plan:    I have personally reviewed and addressed the Medicare Annual Wellness questionnaire and have noted the following in the patient's chart:  A. Medical and social  history B. Use of alcohol, tobacco or illicit drugs  C. Current medications and supplements D. Functional ability and status E.  Nutritional status F.  Physical activity G. Advance directives H. List of other physicians I.  Hospitalizations, surgeries, and ER visits in previous 12 months J.  [place] to include hearing, vision, cognitive, depression L. Referrals and appointments - none  In addition, I have reviewed and discussed with patient certain preventive protocols, quality metrics, and best practice recommendations. A written personalized care plan for preventive services as well as general preventive health recommendations were provided to patient.  See attached scanned questionnaire for additional information.   Signed,   Tyson Dense, RN Nurse Health Advisor  Patient concerns: None

## 2017-11-10 NOTE — Patient Instructions (Signed)
Richard Mack , Thank you for taking time to come for your Medicare Wellness Visit. I appreciate your ongoing commitment to your health goals. Please review the following plan we discussed and let me know if I can assist you in the future.   Screening recommendations/referrals: Colonoscopy excluded, over age 82 Recommended yearly ophthalmology/optometry visit for glaucoma screening and checkup Recommended yearly dental visit for hygiene and checkup  Vaccinations: Influenza vaccine up to date, due 2019 fall season Pneumococcal vaccine 13 due, ordered Tdap vaccine up to date, due 09/04/2021 Shingles vaccine not in past records    Advanced directives: in chart  Conditions/risks identified: none  Next appointment: Dr. Eulas Post makes rounds  Preventive Care 50 Years and Older, Male Preventive care refers to lifestyle choices and visits with your health care provider that can promote health and wellness. What does preventive care include?  A yearly physical exam. This is also called an annual well check.  Dental exams once or twice a year.  Routine eye exams. Ask your health care provider how often you should have your eyes checked.  Personal lifestyle choices, including:  Daily care of your teeth and gums.  Regular physical activity.  Eating a healthy diet.  Avoiding tobacco and drug use.  Limiting alcohol use.  Practicing safe sex.  Taking low doses of aspirin every day.  Taking vitamin and mineral supplements as recommended by your health care provider. What happens during an annual well check? The services and screenings done by your health care provider during your annual well check will depend on your age, overall health, lifestyle risk factors, and family history of disease. Counseling  Your health care provider may ask you questions about your:  Alcohol use.  Tobacco use.  Drug use.  Emotional well-being.  Home and relationship well-being.  Sexual  activity.  Eating habits.  History of falls.  Memory and ability to understand (cognition).  Work and work Statistician. Screening  You may have the following tests or measurements:  Height, weight, and BMI.  Blood pressure.  Lipid and cholesterol levels. These may be checked every 5 years, or more frequently if you are over 33 years old.  Skin check.  Lung cancer screening. You may have this screening every year starting at age 37 if you have a 30-pack-year history of smoking and currently smoke or have quit within the past 15 years.  Fecal occult blood test (FOBT) of the stool. You may have this test every year starting at age 15.  Flexible sigmoidoscopy or colonoscopy. You may have a sigmoidoscopy every 5 years or a colonoscopy every 10 years starting at age 93.  Prostate cancer screening. Recommendations will vary depending on your family history and other risks.  Hepatitis C blood test.  Hepatitis B blood test.  Sexually transmitted disease (STD) testing.  Diabetes screening. This is done by checking your blood sugar (glucose) after you have not eaten for a while (fasting). You may have this done every 1-3 years.  Abdominal aortic aneurysm (AAA) screening. You may need this if you are a current or former smoker.  Osteoporosis. You may be screened starting at age 62 if you are at high risk. Talk with your health care provider about your test results, treatment options, and if necessary, the need for more tests. Vaccines  Your health care provider may recommend certain vaccines, such as:  Influenza vaccine. This is recommended every year.  Tetanus, diphtheria, and acellular pertussis (Tdap, Td) vaccine. You may need a Td  booster every 10 years.  Zoster vaccine. You may need this after age 55.  Pneumococcal 13-valent conjugate (PCV13) vaccine. One dose is recommended after age 47.  Pneumococcal polysaccharide (PPSV23) vaccine. One dose is recommended after age  39. Talk to your health care provider about which screenings and vaccines you need and how often you need them. This information is not intended to replace advice given to you by your health care provider. Make sure you discuss any questions you have with your health care provider. Document Released: 05/16/2015 Document Revised: 01/07/2016 Document Reviewed: 02/18/2015 Elsevier Interactive Patient Education  2017 [employer] Prevention in the Home Falls can cause injuries. They can happen to people of all ages. There are many things you can do to make your home safe and to help prevent falls. What can I do on the outside of my home?  Regularly fix the edges of walkways and driveways and fix any cracks.  Remove anything that might make you trip as you walk through a door, such as a raised step or threshold.  Trim any bushes or trees on the path to your home.  Use bright outdoor lighting.  Clear any walking paths of anything that might make someone trip, such as rocks or tools.  Regularly check to see if handrails are loose or broken. Make sure that both sides of any steps have handrails.  Any raised decks and porches should have guardrails on the edges.  Have any leaves, snow, or ice cleared regularly.  Use sand or salt on walking paths during winter.  Clean up any spills in your garage right away. This includes oil or grease spills. What can I do in the bathroom?  Use night lights.  Install grab bars by the toilet and in the tub and shower. Do not use towel bars as grab bars.  Use non-skid mats or decals in the tub or shower.  If you need to sit down in the shower, use a plastic, non-slip stool.  Keep the floor dry. Clean up any water that spills on the floor as soon as it happens.  Remove soap buildup in the tub or shower regularly.  Attach bath mats securely with double-sided non-slip rug tape.  Do not have throw rugs and other things on the floor that can make  you trip. What can I do in the bedroom?  Use night lights.  Make sure that you have a light by your bed that is easy to reach.  Do not use any sheets or blankets that are too big for your bed. They should not hang down onto the floor.  Have a firm chair that has side arms. You can use this for support while you get dressed.  Do not have throw rugs and other things on the floor that can make you trip. What can I do in the kitchen?  Clean up any spills right away.  Avoid walking on wet floors.  Keep items that you use a lot in easy-to-reach places.  If you need to reach something above you, use a strong step stool that has a grab bar.  Keep electrical cords out of the way.  Do not use floor polish or wax that makes floors slippery. If you must use wax, use non-skid floor wax.  Do not have throw rugs and other things on the floor that can make you trip. What can I do with my stairs?  Do not leave any items on the stairs.  Make sure that there are handrails on both sides of the stairs and use them. Fix handrails that are broken or loose. Make sure that handrails are as long as the stairways.  Check any carpeting to make sure that it is firmly attached to the stairs. Fix any carpet that is loose or worn.  Avoid having throw rugs at the top or bottom of the stairs. If you do have throw rugs, attach them to the floor with carpet tape.  Make sure that you have a light switch at the top of the stairs and the bottom of the stairs. If you do not have them, ask someone to add them for you. What else can I do to help prevent falls?  Wear shoes that:  Do not have high heels.  Have rubber bottoms.  Are comfortable and fit you well.  Are closed at the toe. Do not wear sandals.  If you use a stepladder:  Make sure that it is fully opened. Do not climb a closed stepladder.  Make sure that both sides of the stepladder are locked into place.  Ask someone to hold it for you, if  possible.  Clearly mark and make sure that you can see:  Any grab bars or handrails.  First and last steps.  Where the edge of each step is.  Use tools that help you move around (mobility aids) if they are needed. These include:  Canes.  Walkers.  Scooters.  Crutches.  Turn on the lights when you go into a dark area. Replace any light bulbs as soon as they burn out.  Set up your furniture so you have a clear path. Avoid moving your furniture around.  If any of your floors are uneven, fix them.  If there are any pets around you, be aware of where they are.  Review your medicines with your doctor. Some medicines can make you feel dizzy. This can increase your chance of falling. Ask your doctor what other things that you can do to help prevent falls. This information is not intended to replace advice given to you by your health care provider. Make sure you discuss any questions you have with your health care provider. Document Released: 02/13/2009 Document Revised: 09/25/2015 Document Reviewed: 05/24/2014 Elsevier Interactive Patient Education  2017 [employer].

## 2017-11-16 ENCOUNTER — Non-Acute Institutional Stay (SKILLED_NURSING_FACILITY): Payer: Medicare Other | Admitting: Adult Health

## 2017-11-16 ENCOUNTER — Encounter: Payer: Self-pay | Admitting: Adult Health

## 2017-11-16 DIAGNOSIS — F411 Generalized anxiety disorder: Secondary | ICD-10-CM

## 2017-11-16 DIAGNOSIS — I951 Orthostatic hypotension: Secondary | ICD-10-CM | POA: Diagnosis not present

## 2017-11-16 DIAGNOSIS — E210 Primary hyperparathyroidism: Secondary | ICD-10-CM

## 2017-11-16 NOTE — Progress Notes (Signed)
Location:   [facility] Room Number: [place] of Service:  SNF (31)   CODE STATUS: Full Code  Allergies  Allergen Reactions  . Cephalexin Nausea And Vomiting    Chief Complaint  Patient presents with  . Medical Management of Chronic Issues    orthostatic hypotension; hyperparathyroidism; anxiety.     HPI:  He is a 84 year old long term resident of this facility being seen for the management of his chronic illnesses: orthostatic hypotension; hyperparathyroidism; anxiety. He is unable to fully participate in the hpi or ros. He has had a fall without injury. There are no reports of uncontrolled pain; no change in appetite; no behavioral issues present. There are no nursing concerns at this time.   Past Medical History:  Diagnosis Date  . Brain tumor ([place])   . Chronic constipation   . Headache(784.0)   . Hypercalcemia   . Hypercholesteremia   . Hypertension   . Insomnia   . Irregular heartbeat     Past Surgical History:  Procedure Laterality Date  . BRAIN SURGERY     x3  . SHOULDER SURGERY     right    Social History   Socioeconomic History  . Marital status: Married    Spouse name: Apolonio Schneiders  . Number of children: 2  . Years of education: 2nd  . Highest education level: Not on file  Occupational History  . Occupation: Retired  Scientific laboratory technician  . Financial resource strain: Not hard at all  . Food insecurity:    Worry: Never true    Inability: Never true  . Transportation needs:    Medical: No    Non-medical: No  Tobacco Use  . Smoking status: Former Smoker    Years: 1.00    Types: Cigarettes    Last attempt to quit: 09/19/1976    Years since quitting: 41.1  . Smokeless tobacco: Never Used  Substance and Sexual Activity  . Alcohol use: No  . Drug use: No  . Sexual activity: Not on file  Lifestyle  . Physical activity:    Days per week: 5 days    Minutes per session: 20 min  . Stress: Not at all  Relationships  . Social connections:     Talks on phone: Once a week    Gets together: Once a week    Attends religious service: Never    Active member of club or organization: No    Attends meetings of clubs or organizations: Never    Relationship status: Married  . Intimate partner violence:    Fear of current or ex partner: No    Emotionally abused: No    Physically abused: No    Forced sexual activity: No  Other Topics Concern  . Not on file  Social History Narrative  . Not on file   Family History  Problem Relation Age of Onset  . Appendicitis Father   . AAA (abdominal aortic aneurysm) Father       VITAL SIGNS BP 132/80   Pulse 80   Temp 98 F (36.7 C)   Resp 16   Ht 5\' 6"  (1.676 m)   Wt 143 lb 1.6 oz (64.9 kg)   SpO2 98%   BMI 23.10 kg/m   Outpatient Encounter Medications as of 11/16/2017  Medication Sig  . acetaminophen (TYLENOL) 325 MG tablet Take 650 mg by mouth daily.   Marland Kitchen acetaminophen (TYLENOL) 325 MG tablet Take 650 mg by mouth every 6 (  six) hours as needed for headache.  . donepezil (ARICEPT) 10 MG tablet Take 10 mg by mouth at bedtime.   Marland Kitchen HYDROcodone-acetaminophen (NORCO/VICODIN) 5-325 MG tablet Take 1 tablet by mouth every 8 (eight) hours as needed for moderate pain.  . memantine (NAMENDA XR) 28 MG CP24 24 hr capsule Take 28 mg by mouth daily.  . Nutritional Supplements (NUTRITIONAL SUPPLEMENT PO) NAS (No salt added) diet.  Mechanical soft texture, Regular / thin consistency   No facility-administered encounter medications on file as of 11/16/2017.      SIGNIFICANT DIAGNOSTIC EXAMS  PREVIOUS  04-29-17: chest x-ray:  Mild bibasilar atelectasis.   04-29-17: ct of head and cervical spine:  No focal acute intracranial abnormality identified.  No acute fracture or dislocation of cervical spine.  NO NEW EXAMS      LABS REVIEWED: PREVIOUS   02-04-17: wbc 4.6; hgb 12.8; hct 39; plt 223; glucose 98; bun 24; creat 1.3; k+ 4.6 na++ 136  Liver normal    tsh 0.97 04-08-17: wbc 10.7; hgb  14.5; hct 42.8; mcv 93.2; plt 227; glucose 110; bun 20; creat 1.50; k+ 4.4; na++ 136; ca 11.7; liver normal albumin 3.9 04-29-17: wbc 9.3; hgb 14.1; hct 42.1; mcv 94.6; plt 210; glucose 126; bun 22; creat 1.66 ;k+ 4.6; na++ 137; ca 11.2; liver normal albumin 3.8; d-dimer: 0.48;  04-30-17: wbc6.2; hgb 13.2; hct 39.5; mcv 94.7; plt 219; glucose 90; bun 24; creat 1.51; k+ 4.7; na++ 137  PTH: 112  05-01-17: vit D 7.4 05-05-17: wbc 4.9; hgb 12.7; hct 38.0; mcv 94.2; plt 203; glucose 95; bun 12.9; creat 1.03; k+ 4.3; na++ 140; ca 10.9; liver norma albumin 3.9  06-03-17: wbc 4.9; hgb 13.7; hct 42.1; mcv 96.4; plt 215; glucose 146; bun 21.5; creat 1.41; k+ 4.7; na++ 140; ca 12.0   NO NEW LABS.    Review of Systems  Unable to perform ROS: Dementia (confused )    Physical Exam  Constitutional: No distress.  Frail   Neck: No thyromegaly present.  Cardiovascular: Normal rate, regular rhythm and intact distal pulses.  Murmur heard. 1/6  Pulmonary/Chest: Effort normal and breath sounds normal. No respiratory distress.  Abdominal: Soft. Bowel sounds are normal. He exhibits no distension. There is no tenderness.  Musculoskeletal: He exhibits no edema.  Is able to move all extremities Uses wheelchair   Lymphadenopathy:    He has no cervical adenopathy.  Neurological: He is alert.  Skin: Skin is warm and dry. He is not diaphoretic.  Psychiatric: He has a normal mood and affect.    ASSESSMENT/ PLAN:  TODAY:   1. Orthostatic hypotension: has essential hypertension: is stable: b/p132/80 is off medications;  will continue to monitor   2. Anxiety state:is stable  is currently off all medications will monitor his status   3. Primary hyperparathyroidism: unchanged:  PTH 112 will monitor  PREVIOUS   4. Brain tumor: unchanged:  is status post surgery X3; has headaches: will continue tylenol 650 mg  daily. Has vicodin 5/325 mg every 8 hours as needed for pain.   5. Vascular dementia without  behavioral disturbance: without change: is on aricept 10 mg nightly and namenda xr 28 mg daily  His weight is 143 pounds will monitor   6. Hypercalcemia: without change ca level 12.0;  vit D 7.4   MD is aware of resident's narcotic use and is in agreement with current plan of care. We will attempt to wean resident as apropriate   Ok Edwards NP  [place] [phone] Monday through Friday 8am- 5pm  After hours call [phone]

## 2017-12-01 DIAGNOSIS — G301 Alzheimer's disease with late onset: Secondary | ICD-10-CM | POA: Diagnosis not present

## 2017-12-01 DIAGNOSIS — F0150 Vascular dementia without behavioral disturbance: Secondary | ICD-10-CM | POA: Diagnosis not present

## 2017-12-01 DIAGNOSIS — F419 Anxiety disorder, unspecified: Secondary | ICD-10-CM | POA: Diagnosis not present

## 2017-12-14 ENCOUNTER — Encounter: Payer: Self-pay | Admitting: Adult Health

## 2017-12-14 NOTE — Progress Notes (Signed)
Entered in error

## 2017-12-21 ENCOUNTER — Encounter: Payer: Self-pay | Admitting: Internal Medicine

## 2017-12-23 DIAGNOSIS — M6281 Muscle weakness (generalized): Secondary | ICD-10-CM | POA: Diagnosis not present

## 2017-12-23 DIAGNOSIS — Z85841 Personal history of malignant neoplasm of brain: Secondary | ICD-10-CM | POA: Diagnosis not present

## 2017-12-26 DIAGNOSIS — Z85841 Personal history of malignant neoplasm of brain: Secondary | ICD-10-CM | POA: Diagnosis not present

## 2017-12-26 DIAGNOSIS — M6281 Muscle weakness (generalized): Secondary | ICD-10-CM | POA: Diagnosis not present

## 2017-12-27 DIAGNOSIS — M6281 Muscle weakness (generalized): Secondary | ICD-10-CM | POA: Diagnosis not present

## 2017-12-27 DIAGNOSIS — Z85841 Personal history of malignant neoplasm of brain: Secondary | ICD-10-CM | POA: Diagnosis not present

## 2017-12-28 DIAGNOSIS — M6281 Muscle weakness (generalized): Secondary | ICD-10-CM | POA: Diagnosis not present

## 2017-12-28 DIAGNOSIS — Z85841 Personal history of malignant neoplasm of brain: Secondary | ICD-10-CM | POA: Diagnosis not present

## 2017-12-29 ENCOUNTER — Encounter: Payer: Self-pay | Admitting: Adult Health

## 2017-12-29 ENCOUNTER — Non-Acute Institutional Stay (SKILLED_NURSING_FACILITY): Payer: Medicare Other | Admitting: Adult Health

## 2017-12-29 DIAGNOSIS — D496 Neoplasm of unspecified behavior of brain: Secondary | ICD-10-CM | POA: Diagnosis not present

## 2017-12-29 DIAGNOSIS — F0150 Vascular dementia without behavioral disturbance: Secondary | ICD-10-CM

## 2017-12-29 DIAGNOSIS — F419 Anxiety disorder, unspecified: Secondary | ICD-10-CM | POA: Diagnosis not present

## 2017-12-29 DIAGNOSIS — Z85841 Personal history of malignant neoplasm of brain: Secondary | ICD-10-CM | POA: Diagnosis not present

## 2017-12-29 DIAGNOSIS — M6281 Muscle weakness (generalized): Secondary | ICD-10-CM | POA: Diagnosis not present

## 2017-12-29 DIAGNOSIS — G301 Alzheimer's disease with late onset: Secondary | ICD-10-CM | POA: Diagnosis not present

## 2017-12-29 NOTE — Progress Notes (Signed)
Location:   [facility] Room Number: [place] of Service:  SNF (31)   CODE STATUS: Full Code  Allergies  Allergen Reactions  . Cephalexin Nausea And Vomiting    Chief Complaint  Patient presents with  . Medical Management of Chronic Issues    Brain tumor; vascular dementia; hypercalcemia    HPI:  He is a 84 year old long term resident of this facility being seen for the management of his chronic illnesses; brain tumor; vascular dementia; hypercalcemia. He is unable to fully participate in the hpi or ros. There are no reports of uncontrolled pain; no changes in appetite; no anxiety. There are no nursing concerns at this time.   Past Medical History:  Diagnosis Date  . Brain tumor ([place])   . Chronic constipation   . Headache(784.0)   . Hypercalcemia   . Hypercholesteremia   . Hypertension   . Insomnia   . Irregular heartbeat     Past Surgical History:  Procedure Laterality Date  . BRAIN SURGERY     x3  . SHOULDER SURGERY     right    Social History   Socioeconomic History  . Marital status: Married    Spouse name: Apolonio Schneiders  . Number of children: 2  . Years of education: 2nd  . Highest education level: Not on file  Occupational History  . Occupation: Retired  Scientific laboratory technician  . Financial resource strain: Not hard at all  . Food insecurity:    Worry: Never true    Inability: Never true  . Transportation needs:    Medical: No    Non-medical: No  Tobacco Use  . Smoking status: Former Smoker    Years: 1.00    Types: Cigarettes    Last attempt to quit: 09/19/1976    Years since quitting: 41.3  . Smokeless tobacco: Never Used  Substance and Sexual Activity  . Alcohol use: No  . Drug use: No  . Sexual activity: Not on file  Lifestyle  . Physical activity:    Days per week: 5 days    Minutes per session: 20 min  . Stress: Not at all  Relationships  . Social connections:    Talks on phone: Once a week    Gets together: Once a week   Attends religious service: Never    Active member of club or organization: No    Attends meetings of clubs or organizations: Never    Relationship status: Married  . Intimate partner violence:    Fear of current or ex partner: No    Emotionally abused: No    Physically abused: No    Forced sexual activity: No  Other Topics Concern  . Not on file  Social History Narrative  . Not on file   Family History  Problem Relation Age of Onset  . Appendicitis Father   . AAA (abdominal aortic aneurysm) Father       VITAL SIGNS BP 126/64   Pulse 77   Temp (!) 97.3 F (36.3 C)   Resp 18   Ht 5\' 6"  (1.676 m)   Wt 143 lb 1.6 oz (64.9 kg)   SpO2 98%   BMI 23.10 kg/m   Outpatient Encounter Medications as of 12/29/2017  Medication Sig  . acetaminophen (TYLENOL) 325 MG tablet Take 650 mg by mouth daily.   Marland Kitchen acetaminophen (TYLENOL) 325 MG tablet Take 650 mg by mouth every 6 (six) hours as needed for headache.  . donepezil (  ARICEPT) 10 MG tablet Take 10 mg by mouth at bedtime.   Marland Kitchen HYDROcodone-acetaminophen (NORCO/VICODIN) 5-325 MG tablet Take 1 tablet by mouth every 8 (eight) hours as needed for moderate pain.  . memantine (NAMENDA XR) 28 MG CP24 24 hr capsule Take 28 mg by mouth daily.  . Nutritional Supplements (NUTRITIONAL SUPPLEMENT PO) NAS (No salt added) diet.  Mechanical soft texture, Regular / thin consistency   No facility-administered encounter medications on file as of 12/29/2017.      SIGNIFICANT DIAGNOSTIC EXAMS  PREVIOUS  04-29-17: chest x-ray:  Mild bibasilar atelectasis.   04-29-17: ct of head and cervical spine:  No focal acute intracranial abnormality identified.  No acute fracture or dislocation of cervical spine.  NO NEW EXAMS      LABS REVIEWED: PREVIOUS   02-04-17: wbc 4.6; hgb 12.8; hct 39; plt 223; glucose 98; bun 24; creat 1.3; k+ 4.6 na++ 136  Liver normal    tsh 0.97 04-08-17: wbc 10.7; hgb 14.5; hct 42.8; mcv 93.2; plt 227; glucose 110; bun 20; creat  1.50; k+ 4.4; na++ 136; ca 11.7; liver normal albumin 3.9 04-29-17: wbc 9.3; hgb 14.1; hct 42.1; mcv 94.6; plt 210; glucose 126; bun 22; creat 1.66 ;k+ 4.6; na++ 137; ca 11.2; liver normal albumin 3.8; d-dimer: 0.48;  04-30-17: wbc6.2; hgb 13.2; hct 39.5; mcv 94.7; plt 219; glucose 90; bun 24; creat 1.51; k+ 4.7; na++ 137  PTH: 112  05-01-17: vit D 7.4 05-05-17: wbc 4.9; hgb 12.7; hct 38.0; mcv 94.2; plt 203; glucose 95; bun 12.9; creat 1.03; k+ 4.3; na++ 140; ca 10.9; liver norma albumin 3.9  06-03-17: wbc 4.9; hgb 13.7; hct 42.1; mcv 96.4; plt 215; glucose 146; bun 21.5; creat 1.41; k+ 4.7; na++ 140; ca 12.0   NO NEW LABS.    Review of Systems  Unable to perform ROS: Dementia (confused )    Physical Exam  Constitutional: No distress.  Frail   Neck: No thyromegaly present.  Cardiovascular: Normal rate, regular rhythm and intact distal pulses.  Murmur heard. 1/6  Pulmonary/Chest: Effort normal and breath sounds normal. No respiratory distress.  Abdominal: Soft. Bowel sounds are normal. He exhibits no distension. There is no tenderness.  Musculoskeletal: He exhibits no edema.  Is able to move all extremities Uses wheelchair    Lymphadenopathy:    He has no cervical adenopathy.  Neurological: He is alert.  Skin: Skin is warm and dry. He is not diaphoretic.  Psychiatric: He has a normal mood and affect.       ASSESSMENT/ PLAN:  TODAY:   1. Brain tumor: unchanged:  is status post surgery X3; has headaches: will continue tylenol 650 mg  daily. Has vicodin 5/325 mg every 8 hours as needed for pain.   2. Vascular dementia without behavioral disturbance: without change: is on aricept 10 mg nightly and namenda xr 28 mg daily  His weight is 143 pounds will monitor   3. Hypercalcemia: without change ca level 12.0;  vit D 7.4  PREVIOUS   4. Orthostatic hypotension: has essential hypertension: is stable: b/p126/64 is off medications;  will continue to monitor   5. Anxiety state:is  stable  is currently off all medications will monitor his status   6. Primary hyperparathyroidism: unchanged:  PTH 112 will monitor    MD is aware of resident's narcotic use and is in agreement with current plan of care. We will attempt to wean resident as apropriate   Ok Edwards NP [facility]  Contact [phone] Monday through Friday 8am- 5pm  After hours call [phone]

## 2018-01-01 DIAGNOSIS — Z741 Need for assistance with personal care: Secondary | ICD-10-CM | POA: Diagnosis not present

## 2018-01-01 DIAGNOSIS — M6281 Muscle weakness (generalized): Secondary | ICD-10-CM | POA: Diagnosis not present

## 2018-01-01 DIAGNOSIS — Z85841 Personal history of malignant neoplasm of brain: Secondary | ICD-10-CM | POA: Diagnosis not present

## 2018-01-02 DIAGNOSIS — M6281 Muscle weakness (generalized): Secondary | ICD-10-CM | POA: Diagnosis not present

## 2018-01-02 DIAGNOSIS — Z85841 Personal history of malignant neoplasm of brain: Secondary | ICD-10-CM | POA: Diagnosis not present

## 2018-01-02 DIAGNOSIS — F4323 Adjustment disorder with mixed anxiety and depressed mood: Secondary | ICD-10-CM | POA: Diagnosis not present

## 2018-01-02 DIAGNOSIS — Z741 Need for assistance with personal care: Secondary | ICD-10-CM | POA: Diagnosis not present

## 2018-01-03 DIAGNOSIS — Z85841 Personal history of malignant neoplasm of brain: Secondary | ICD-10-CM | POA: Diagnosis not present

## 2018-01-03 DIAGNOSIS — M6281 Muscle weakness (generalized): Secondary | ICD-10-CM | POA: Diagnosis not present

## 2018-01-03 DIAGNOSIS — Z741 Need for assistance with personal care: Secondary | ICD-10-CM | POA: Diagnosis not present

## 2018-01-04 DIAGNOSIS — M6281 Muscle weakness (generalized): Secondary | ICD-10-CM | POA: Diagnosis not present

## 2018-01-04 DIAGNOSIS — Z741 Need for assistance with personal care: Secondary | ICD-10-CM | POA: Diagnosis not present

## 2018-01-04 DIAGNOSIS — Z85841 Personal history of malignant neoplasm of brain: Secondary | ICD-10-CM | POA: Diagnosis not present

## 2018-01-05 DIAGNOSIS — M6281 Muscle weakness (generalized): Secondary | ICD-10-CM | POA: Diagnosis not present

## 2018-01-05 DIAGNOSIS — Z741 Need for assistance with personal care: Secondary | ICD-10-CM | POA: Diagnosis not present

## 2018-01-05 DIAGNOSIS — Z85841 Personal history of malignant neoplasm of brain: Secondary | ICD-10-CM | POA: Diagnosis not present

## 2018-01-06 DIAGNOSIS — M6281 Muscle weakness (generalized): Secondary | ICD-10-CM | POA: Diagnosis not present

## 2018-01-06 DIAGNOSIS — Z85841 Personal history of malignant neoplasm of brain: Secondary | ICD-10-CM | POA: Diagnosis not present

## 2018-01-06 DIAGNOSIS — Z741 Need for assistance with personal care: Secondary | ICD-10-CM | POA: Diagnosis not present

## 2018-01-09 DIAGNOSIS — Z741 Need for assistance with personal care: Secondary | ICD-10-CM | POA: Diagnosis not present

## 2018-01-09 DIAGNOSIS — M6281 Muscle weakness (generalized): Secondary | ICD-10-CM | POA: Diagnosis not present

## 2018-01-09 DIAGNOSIS — Z85841 Personal history of malignant neoplasm of brain: Secondary | ICD-10-CM | POA: Diagnosis not present

## 2018-01-09 DIAGNOSIS — F4323 Adjustment disorder with mixed anxiety and depressed mood: Secondary | ICD-10-CM | POA: Diagnosis not present

## 2018-01-11 DIAGNOSIS — Z741 Need for assistance with personal care: Secondary | ICD-10-CM | POA: Diagnosis not present

## 2018-01-11 DIAGNOSIS — Z85841 Personal history of malignant neoplasm of brain: Secondary | ICD-10-CM | POA: Diagnosis not present

## 2018-01-11 DIAGNOSIS — M6281 Muscle weakness (generalized): Secondary | ICD-10-CM | POA: Diagnosis not present

## 2018-01-12 DIAGNOSIS — Z85841 Personal history of malignant neoplasm of brain: Secondary | ICD-10-CM | POA: Diagnosis not present

## 2018-01-12 DIAGNOSIS — Z741 Need for assistance with personal care: Secondary | ICD-10-CM | POA: Diagnosis not present

## 2018-01-12 DIAGNOSIS — M6281 Muscle weakness (generalized): Secondary | ICD-10-CM | POA: Diagnosis not present

## 2018-01-13 DIAGNOSIS — Z85841 Personal history of malignant neoplasm of brain: Secondary | ICD-10-CM | POA: Diagnosis not present

## 2018-01-13 DIAGNOSIS — Z741 Need for assistance with personal care: Secondary | ICD-10-CM | POA: Diagnosis not present

## 2018-01-13 DIAGNOSIS — M6281 Muscle weakness (generalized): Secondary | ICD-10-CM | POA: Diagnosis not present

## 2018-01-14 DIAGNOSIS — M6281 Muscle weakness (generalized): Secondary | ICD-10-CM | POA: Diagnosis not present

## 2018-01-14 DIAGNOSIS — Z741 Need for assistance with personal care: Secondary | ICD-10-CM | POA: Diagnosis not present

## 2018-01-14 DIAGNOSIS — Z85841 Personal history of malignant neoplasm of brain: Secondary | ICD-10-CM | POA: Diagnosis not present

## 2018-01-19 DIAGNOSIS — Z85841 Personal history of malignant neoplasm of brain: Secondary | ICD-10-CM | POA: Diagnosis not present

## 2018-01-19 DIAGNOSIS — M6281 Muscle weakness (generalized): Secondary | ICD-10-CM | POA: Diagnosis not present

## 2018-01-19 DIAGNOSIS — Z741 Need for assistance with personal care: Secondary | ICD-10-CM | POA: Diagnosis not present

## 2018-01-22 DIAGNOSIS — Z741 Need for assistance with personal care: Secondary | ICD-10-CM | POA: Diagnosis not present

## 2018-01-22 DIAGNOSIS — Z85841 Personal history of malignant neoplasm of brain: Secondary | ICD-10-CM | POA: Diagnosis not present

## 2018-01-22 DIAGNOSIS — M6281 Muscle weakness (generalized): Secondary | ICD-10-CM | POA: Diagnosis not present

## 2018-01-23 ENCOUNTER — Encounter: Payer: Self-pay | Admitting: Internal Medicine

## 2018-01-23 ENCOUNTER — Non-Acute Institutional Stay (SKILLED_NURSING_FACILITY): Payer: Medicare Other | Admitting: Internal Medicine

## 2018-01-23 DIAGNOSIS — E210 Primary hyperparathyroidism: Secondary | ICD-10-CM

## 2018-01-23 DIAGNOSIS — Z87898 Personal history of other specified conditions: Secondary | ICD-10-CM

## 2018-01-23 DIAGNOSIS — R519 Headache, unspecified: Secondary | ICD-10-CM

## 2018-01-23 DIAGNOSIS — I951 Orthostatic hypotension: Secondary | ICD-10-CM | POA: Diagnosis not present

## 2018-01-23 DIAGNOSIS — Z79899 Other long term (current) drug therapy: Secondary | ICD-10-CM

## 2018-01-23 DIAGNOSIS — M6281 Muscle weakness (generalized): Secondary | ICD-10-CM | POA: Diagnosis not present

## 2018-01-23 DIAGNOSIS — F0150 Vascular dementia without behavioral disturbance: Secondary | ICD-10-CM

## 2018-01-23 DIAGNOSIS — R51 Headache: Secondary | ICD-10-CM | POA: Diagnosis not present

## 2018-01-23 DIAGNOSIS — Z85841 Personal history of malignant neoplasm of brain: Secondary | ICD-10-CM | POA: Diagnosis not present

## 2018-01-23 DIAGNOSIS — F4323 Adjustment disorder with mixed anxiety and depressed mood: Secondary | ICD-10-CM | POA: Diagnosis not present

## 2018-01-23 DIAGNOSIS — Z741 Need for assistance with personal care: Secondary | ICD-10-CM | POA: Diagnosis not present

## 2018-01-23 NOTE — Progress Notes (Signed)
Patient ID: Richard Mack, male   DOB: 05-14-1928, 84 y.o.   MRN: [id number]   Location:  [place] Room Number: [place] of Service:  SNF [phone]) Provider:  [place], [place], DO  Patient Care Team: Gildardo Cranker, DO as PCP - General (Internal Medicine) Nyoka Cowden Phylis Bougie, NP as Nurse Practitioner ([place] [place] ([place])  Extended Emergency Contact Information Primary Emergency Contact: Sahota,[name] Address: [address]          [place], [place] [address] [name] of [place] Phone: [phone] Work Phone: [phone] Mobile Phone: [phone] Relation: Spouse  Code Status:  Full Code Goals of care: Advanced Directive information Advanced Directives 01/23/2018  Does Patient Have a Medical Advance Directive? Yes  Type of Advance Directive Out of facility DNR (pink MOST or yellow form)  Does patient want to make changes to medical advance directive? No - Patient declined  Would patient like information on creating a medical advance directive? -  Pre-existing out of facility DNR order (yellow form or pink MOST form) Pink MOST form placed in chart (order not valid for inpatient use)     Chief Complaint  Patient presents with  . Medical Management of Chronic Issues        HPI:  Pt is a 84 y.o. male seen today for medical management of chronic diseases.  He c/o chronic HA. Appetite ok and sleeps well. No falls. No nursing issues. He is a poor historian due to dementia. Hx obtained from chart.  Brain tumor - s/p surgery x 3; unchanged; he has chronic headaches. Takes tylenol 650 mg  Daily; vicodin 5/325 mg every 8 hours as needed for pain.   Vascular dementia - stable on aricept 10 mg nightly and namenda xr 28 mg daily; weight stable  Hypercalcemia with primary hyperPTHdism - unchanged; Ca2+ 12.0;  Vit D25OH level 7.4; PTH 112  Orthostatic hypotension - hx HTN; stable off BP  meds  Anxiety state - stable off meds   Past Medical History:  Diagnosis Date  . Brain tumor ([place])   . Chronic constipation   . Headache(784.0)   . Hypercalcemia   . Hypercholesteremia   . Hypertension   . Insomnia   . Irregular heartbeat    Past Surgical History:  Procedure Laterality Date  . BRAIN SURGERY     x3  . SHOULDER SURGERY     right    Allergies  Allergen Reactions  . Cephalexin Nausea And Vomiting    Outpatient Encounter Medications as of 01/23/2018  Medication Sig  . acetaminophen (TYLENOL) 325 MG tablet Take 650 mg by mouth daily.   Marland Kitchen acetaminophen (TYLENOL) 325 MG tablet Take 650 mg by mouth every 6 (six) hours as needed for headache.  . donepezil (ARICEPT) 10 MG tablet Take 10 mg by mouth at bedtime.   Marland Kitchen HYDROcodone-acetaminophen (NORCO/VICODIN) 5-325 MG tablet Take 1 tablet by mouth every 8 (eight) hours as needed for moderate pain.  . memantine (NAMENDA XR) 28 MG CP24 24 hr capsule Take 28 mg by mouth daily.  . Nutritional Supplements (NUTRITIONAL SUPPLEMENT PO) NAS (No salt added) diet.  Mechanical soft texture, Regular / thin consistency   No facility-administered encounter medications on file as of 01/23/2018.     Review of Systems  Unable to perform ROS: Dementia    Immunization History  Administered Date(s) Administered  . Influenza-Unspecified 02/05/2010, 02/18/2012, 03/12/2013, 02/14/2014  . PPD Test 08/17/2016  .  Pneumococcal-Unspecified 11/06/2016  . Td 09/05/2011   Pertinent  Health Maintenance Due  Topic Date Due  . PNA vac Low Risk Adult (2 of 2 - PCV13) 01/30/2018 (Originally 11/06/2017)  . INFLUENZA VACCINE  Discontinued   Fall Risk  11/10/2017 05/15/2017 04/14/2017 11/04/2016  Falls in the past year? No No No No   Functional Status Survey:    Vitals:   01/23/18 0924  Weight: 143 lb 1.6 oz (64.9 kg)  Height: 5\' 6"  (1.676 m)   Body mass index is 23.1 kg/m. Physical Exam  Constitutional: He appears well-developed.  Frail  appearing sitting in chair at bedside  HENT:  Mouth/Throat: Oropharynx is clear and moist.  MMM; no oral thrush  Eyes: Pupils are equal, round, and reactive to light. No scleral icterus.  Neck: Neck supple. Carotid bruit is not present. No thyromegaly present.  Cardiovascular: Normal rate, regular rhythm and intact distal pulses. Exam reveals no gallop and no friction rub.  Murmur (1/6 SEM) heard. no distal LE swelling. No calf TTP  Pulmonary/Chest: Effort normal. He has decreased breath sounds (b/l at base). He has no wheezes. He has no rales. He exhibits no tenderness.  Abdominal: Soft. Bowel sounds are normal. He exhibits no distension, no abdominal bruit, no pulsatile midline mass and no mass. There is no hepatomegaly. There is no tenderness. There is no rebound and no guarding.  Musculoskeletal: He exhibits edema (small and large joints).  Lymphadenopathy:    He has no cervical adenopathy.  Neurological: He is alert. He has normal reflexes.  Skin: Skin is warm and dry. No rash noted.  Psychiatric: He has a normal mood and affect. His behavior is normal.    Labs reviewed: Recent Labs    04/29/17 1944 04/30/17 0528 05/01/17 0413 06/03/17  NA 137 137 135 140  K 4.6 4.7 4.1 4.7  CL 104 106 105  --   CO2 23 22 23   --   GLUCOSE 126* 90 91  --   BUN 22* 24* 17 22*  CREATININE 1.66* 1.51* 1.24 1.4*  CALCIUM 11.2* 10.9* 10.8*  --    Recent Labs    02/04/17 04/08/17 1145 04/29/17 1944  AST 12* 20 19  ALT 10 13* 14*  ALKPHOS 102 125 113  BILITOT  --  0.7 0.6  PROT  --  7.0 6.8  ALBUMIN  --  3.9 3.8   Recent Labs    04/08/17 1145 04/29/17 1944 04/30/17 0528 05/05/17 06/03/17  WBC 10.7* 9.3 6.7 4.9 4.9  NEUTROABS 9.3* 8.0*  --  3  --   HGB 14.5 14.1 13.2 12.7* 13.7  HCT 42.8 42.1 39.5 38* 42  MCV 93.2 94.6 94.7  --   --   PLT 227 210 219 203 215   Lab Results  Component Value Date   TSH 0.97 02/04/2017   No results found for: HGBA1C No results found for: CHOL,  HDL, LDLCALC, LDLDIRECT, TRIG, CHOLHDL  Significant Diagnostic Results in last 30 days:  No results found.  Assessment/Plan   ICD-10-CM   1. High risk medication use Z79.899   2. Vascular dementia without behavioral disturbance F01.50   3. Primary hyperparathyroidism ([place]) E21.0    with hyperCa2+  4. Chronic nonintractable headache, unspecified headache type R51    2/2 #5  5. History of brain tumor Z87.898   6. Orthostatic hypotension I95.1     Cont current meds as ordered  Cont nutritional supplements as ordered  PT/OT/ST as indicated  Will follow  Labs/tests ordered: cmp (note pt known to refuse lab draws)   Franklin S. Perlie Gold  [facility] [address] [place], [place] [address] [phone] Cell (Monday-Friday 8 AM - 5 PM) [phone] After 5 PM and follow prompts

## 2018-01-24 DIAGNOSIS — Z741 Need for assistance with personal care: Secondary | ICD-10-CM | POA: Diagnosis not present

## 2018-01-24 DIAGNOSIS — M6281 Muscle weakness (generalized): Secondary | ICD-10-CM | POA: Diagnosis not present

## 2018-01-24 DIAGNOSIS — Z85841 Personal history of malignant neoplasm of brain: Secondary | ICD-10-CM | POA: Diagnosis not present

## 2018-01-27 DIAGNOSIS — G301 Alzheimer's disease with late onset: Secondary | ICD-10-CM | POA: Diagnosis not present

## 2018-01-27 DIAGNOSIS — F0150 Vascular dementia without behavioral disturbance: Secondary | ICD-10-CM | POA: Diagnosis not present

## 2018-01-27 DIAGNOSIS — F419 Anxiety disorder, unspecified: Secondary | ICD-10-CM | POA: Diagnosis not present

## 2018-02-16 ENCOUNTER — Non-Acute Institutional Stay (SKILLED_NURSING_FACILITY): Payer: Medicare Other | Admitting: Adult Health

## 2018-02-16 ENCOUNTER — Encounter: Payer: Self-pay | Admitting: Adult Health

## 2018-02-16 DIAGNOSIS — F0150 Vascular dementia without behavioral disturbance: Secondary | ICD-10-CM

## 2018-02-16 DIAGNOSIS — D496 Neoplasm of unspecified behavior of brain: Secondary | ICD-10-CM | POA: Diagnosis not present

## 2018-02-16 DIAGNOSIS — I951 Orthostatic hypotension: Secondary | ICD-10-CM | POA: Diagnosis not present

## 2018-02-16 NOTE — Progress Notes (Signed)
Location:   [facility] Room Number: [place] of Service:  SNF (31)   CODE STATUS: Full Code  Allergies  Allergen Reactions  . Cephalexin Nausea And Vomiting    Chief Complaint  Patient presents with  . Acute Visit    Care Plan Meeting    HPI:  We have come together for his routine care plan meeting. He is unable to participate; he does have family present. She reports that he is having headaches. She states that his appetite is good; likes fried chicken; no signs of depressive thought present.   Past Medical History:  Diagnosis Date  . Brain tumor ([place])   . Chronic constipation   . Headache(784.0)   . Hypercalcemia   . Hypercholesteremia   . Hypertension   . Insomnia   . Irregular heartbeat     Past Surgical History:  Procedure Laterality Date  . BRAIN SURGERY     x3  . SHOULDER SURGERY     right    Social History   Socioeconomic History  . Marital status: Married    Spouse name: Apolonio Schneiders  . Number of children: 2  . Years of education: 2nd  . Highest education level: Not on file  Occupational History  . Occupation: Retired  Scientific laboratory technician  . Financial resource strain: Not hard at all  . Food insecurity:    Worry: Never true    Inability: Never true  . Transportation needs:    Medical: No    Non-medical: No  Tobacco Use  . Smoking status: Former Smoker    Years: 1.00    Types: Cigarettes    Last attempt to quit: 09/19/1976    Years since quitting: 41.4  . Smokeless tobacco: Never Used  Substance and Sexual Activity  . Alcohol use: No  . Drug use: No  . Sexual activity: Not on file  Lifestyle  . Physical activity:    Days per week: 5 days    Minutes per session: 20 min  . Stress: Not at all  Relationships  . Social connections:    Talks on phone: Once a week    Gets together: Once a week    Attends religious service: Never    Active member of club or organization: No    Attends meetings of clubs or organizations: Never      Relationship status: Married  . Intimate partner violence:    Fear of current or ex partner: No    Emotionally abused: No    Physically abused: No    Forced sexual activity: No  Other Topics Concern  . Not on file  Social History Narrative  . Not on file   Family History  Problem Relation Age of Onset  . Appendicitis Father   . AAA (abdominal aortic aneurysm) Father       VITAL SIGNS Ht 5\' 6"  (1.676 m)   Wt 143 lb 1.6 oz (64.9 kg)   BMI 23.10 kg/m   Outpatient Encounter Medications as of 02/16/2018  Medication Sig  . acetaminophen (TYLENOL) 325 MG tablet Take 650 mg by mouth daily.   Marland Kitchen acetaminophen (TYLENOL) 325 MG tablet Take 650 mg by mouth every 6 (six) hours as needed for headache.  . donepezil (ARICEPT) 10 MG tablet Take 10 mg by mouth at bedtime.   Marland Kitchen HYDROcodone-acetaminophen (NORCO/VICODIN) 5-325 MG tablet Take 1 tablet by mouth every 8 (eight) hours as needed for moderate pain.  . memantine (NAMENDA XR) 28 MG CP24  24 hr capsule Take 28 mg by mouth daily.  . Nutritional Supplements (NUTRITIONAL SUPPLEMENT PO) NAS (No salt added) diet.  Mechanical soft texture, Regular / thin consistency   No facility-administered encounter medications on file as of 02/16/2018.      SIGNIFICANT DIAGNOSTIC EXAMS  PREVIOUS  04-29-17: chest x-ray:  Mild bibasilar atelectasis.   04-29-17: ct of head and cervical spine:  No focal acute intracranial abnormality identified.  No acute fracture or dislocation of cervical spine.  NO NEW EXAMS      LABS REVIEWED: PREVIOUS  WILL DECLINE LABS  02-04-17: wbc 4.6; hgb 12.8; hct 39; plt 223; glucose 98; bun 24; creat 1.3; k+ 4.6 na++ 136  Liver normal    tsh 0.97 04-08-17: wbc 10.7; hgb 14.5; hct 42.8; mcv 93.2; plt 227; glucose 110; bun 20; creat 1.50; k+ 4.4; na++ 136; ca 11.7; liver normal albumin 3.9 04-29-17: wbc 9.3; hgb 14.1; hct 42.1; mcv 94.6; plt 210; glucose 126; bun 22; creat 1.66 ;k+ 4.6; na++ 137; ca 11.2; liver normal  albumin 3.8; d-dimer: 0.48;  04-30-17: wbc6.2; hgb 13.2; hct 39.5; mcv 94.7; plt 219; glucose 90; bun 24; creat 1.51; k+ 4.7; na++ 137  PTH: 112  05-01-17: vit D 7.4 05-05-17: wbc 4.9; hgb 12.7; hct 38.0; mcv 94.2; plt 203; glucose 95; bun 12.9; creat 1.03; k+ 4.3; na++ 140; ca 10.9; liver norma albumin 3.9  06-03-17: wbc 4.9; hgb 13.7; hct 42.1; mcv 96.4; plt 215; glucose 146; bun 21.5; creat 1.41; k+ 4.7; na++ 140; ca 12.0   NO NEW LABS.    Review of Systems  Unable to perform ROS: Dementia (confusion )     Physical Exam  Constitutional: No distress.  Frail   Neck: No thyromegaly present.  Cardiovascular: Normal rate, regular rhythm and intact distal pulses.  Murmur heard. 1/6  Pulmonary/Chest: Effort normal and breath sounds normal. No respiratory distress.  Abdominal: Soft. Bowel sounds are normal. He exhibits no distension. There is no tenderness.  Musculoskeletal: He exhibits no edema.  Is able to move all extremities Uses wheelchair     Lymphadenopathy:    He has no cervical adenopathy.  Neurological: He is alert.  Skin: Skin is warm and dry. He is not diaphoretic.  Psychiatric: He has a normal mood and affect.    ASSESSMENT/ PLAN:  TODAY:   1. Brain tumor:  2. Vascular dementia without behavioral disturbance: 3. Orthostatic hypotension: has essential hypertension:   Will change her tylenol to 650 mg four times daily and will monitor  Will continue his current care plan    MD is aware of resident's narcotic use and is in agreement with current plan of care. We will attempt to wean resident as apropriate   Ok Edwards NP [facility]  Contact [phone] Monday through Friday 8am- 5pm  After hours call [phone]

## 2018-02-23 ENCOUNTER — Non-Acute Institutional Stay (SKILLED_NURSING_FACILITY): Payer: Medicare Other | Admitting: Adult Health

## 2018-02-23 ENCOUNTER — Encounter: Payer: Self-pay | Admitting: Adult Health

## 2018-02-23 DIAGNOSIS — I951 Orthostatic hypotension: Secondary | ICD-10-CM

## 2018-02-23 DIAGNOSIS — E210 Primary hyperparathyroidism: Secondary | ICD-10-CM

## 2018-02-23 DIAGNOSIS — F0150 Vascular dementia without behavioral disturbance: Secondary | ICD-10-CM | POA: Diagnosis not present

## 2018-02-23 DIAGNOSIS — F411 Generalized anxiety disorder: Secondary | ICD-10-CM

## 2018-02-23 DIAGNOSIS — G301 Alzheimer's disease with late onset: Secondary | ICD-10-CM | POA: Diagnosis not present

## 2018-02-23 DIAGNOSIS — F419 Anxiety disorder, unspecified: Secondary | ICD-10-CM | POA: Diagnosis not present

## 2018-02-23 NOTE — Progress Notes (Signed)
Location:   [facility] Room Number: [place] of Service:  SNF (31)   CODE STATUS: Full Code  Allergies  Allergen Reactions  . Cephalexin Nausea And Vomiting    Chief Complaint  Patient presents with  . Medical Management of Chronic Issues    Orthostatic hypotension; primary hyperparathyroidism; anxiety state.     HPI:  He is a 84 year old long term resident of this facility being seen for the management of his chronic illnesses: hypotension; hyperparathyroidism; anxiety. He is unable to participate in the hpi or ros. There are no reports of uncontrolled pain; no insomnia; no anxiety; no changes in appetite.   Past Medical History:  Diagnosis Date  . Brain tumor ([place])   . Chronic constipation   . Headache(784.0)   . Hypercalcemia   . Hypercholesteremia   . Hypertension   . Insomnia   . Irregular heartbeat     Past Surgical History:  Procedure Laterality Date  . BRAIN SURGERY     x3  . SHOULDER SURGERY     right    Social History   Socioeconomic History  . Marital status: Married    Spouse name: Apolonio Schneiders  . Number of children: 2  . Years of education: 2nd  . Highest education level: Not on file  Occupational History  . Occupation: Retired  Scientific laboratory technician  . Financial resource strain: Not hard at all  . Food insecurity:    Worry: Never true    Inability: Never true  . Transportation needs:    Medical: No    Non-medical: No  Tobacco Use  . Smoking status: Former Smoker    Years: 1.00    Types: Cigarettes    Last attempt to quit: 09/19/1976    Years since quitting: 41.4  . Smokeless tobacco: Never Used  Substance and Sexual Activity  . Alcohol use: No  . Drug use: No  . Sexual activity: Not on file  Lifestyle  . Physical activity:    Days per week: 5 days    Minutes per session: 20 min  . Stress: Not at all  Relationships  . Social connections:    Talks on phone: Once a week    Gets together: Once a week    Attends religious  service: Never    Active member of club or organization: No    Attends meetings of clubs or organizations: Never    Relationship status: Married  . Intimate partner violence:    Fear of current or ex partner: No    Emotionally abused: No    Physically abused: No    Forced sexual activity: No  Other Topics Concern  . Not on file  Social History Narrative  . Not on file   Family History  Problem Relation Age of Onset  . Appendicitis Father   . AAA (abdominal aortic aneurysm) Father       VITAL SIGNS BP 131/75   Pulse 87   Temp 98.4 F (36.9 C)   Resp 20   Ht 5\' 6"  (1.676 m)   Wt 143 lb 1.6 oz (64.9 kg)   SpO2 100%   BMI 23.10 kg/m   Outpatient Encounter Medications as of 02/23/2018  Medication Sig  . acetaminophen (TYLENOL) 325 MG tablet Take 650 mg by mouth 4 (four) times daily.   Marland Kitchen acetaminophen (TYLENOL) 325 MG tablet Take 650 mg by mouth every 6 (six) hours as needed for headache.  . donepezil (ARICEPT) 10 MG  tablet Take 10 mg by mouth at bedtime.   Marland Kitchen HYDROcodone-acetaminophen (NORCO/VICODIN) 5-325 MG tablet Take 1 tablet by mouth every 8 (eight) hours as needed for moderate pain.  . memantine (NAMENDA XR) 28 MG CP24 24 hr capsule Take 28 mg by mouth daily.  . Nutritional Supplements (NUTRITIONAL SUPPLEMENT PO) NAS (No salt added) diet.  Mechanical soft texture, Regular / thin consistency   No facility-administered encounter medications on file as of 02/23/2018.      SIGNIFICANT DIAGNOSTIC EXAMS   PREVIOUS  04-29-17: chest x-ray:  Mild bibasilar atelectasis.   04-29-17: ct of head and cervical spine:  No focal acute intracranial abnormality identified.  No acute fracture or dislocation of cervical spine.  NO NEW EXAMS      LABS REVIEWED: PREVIOUS  WILL DECLINE LABS  02-04-17: wbc 4.6; hgb 12.8; hct 39; plt 223; glucose 98; bun 24; creat 1.3; k+ 4.6 na++ 136  Liver normal    tsh 0.97 04-08-17: wbc 10.7; hgb 14.5; hct 42.8; mcv 93.2; plt 227; glucose 110;  bun 20; creat 1.50; k+ 4.4; na++ 136; ca 11.7; liver normal albumin 3.9 04-29-17: wbc 9.3; hgb 14.1; hct 42.1; mcv 94.6; plt 210; glucose 126; bun 22; creat 1.66 ;k+ 4.6; na++ 137; ca 11.2; liver normal albumin 3.8; d-dimer: 0.48;  04-30-17: wbc6.2; hgb 13.2; hct 39.5; mcv 94.7; plt 219; glucose 90; bun 24; creat 1.51; k+ 4.7; na++ 137  PTH: 112  05-01-17: vit D 7.4 05-05-17: wbc 4.9; hgb 12.7; hct 38.0; mcv 94.2; plt 203; glucose 95; bun 12.9; creat 1.03; k+ 4.3; na++ 140; ca 10.9; liver norma albumin 3.9  06-03-17: wbc 4.9; hgb 13.7; hct 42.1; mcv 96.4; plt 215; glucose 146; bun 21.5; creat 1.41; k+ 4.7; na++ 140; ca 12.0   NO NEW LABS.   Review of Systems  Unable to perform ROS: Dementia (confusion )   Physical Exam  Constitutional: No distress.  Frail   Neck: No thyromegaly present.  Cardiovascular: Normal rate, regular rhythm and intact distal pulses.  Murmur heard. 1/6  Pulmonary/Chest: Effort normal and breath sounds normal. No respiratory distress.  Abdominal: Soft. Bowel sounds are normal. He exhibits no distension. There is no tenderness.  Musculoskeletal: He exhibits no edema.  Is able to move all extremities Uses wheelchair      Lymphadenopathy:    He has no cervical adenopathy.  Neurological: He is alert.  Skin: Skin is warm and dry. He is not diaphoretic.  Psychiatric: He has a normal mood and affect.      ASSESSMENT/ PLAN:  TODAY:    1. Orthostatic hypotension: has essential hypertension: is stable: b/p131/75 is off medications;  will continue to monitor   2. Anxiety state:is stable  is currently off all medications will monitor his status   3. Primary hyperparathyroidism: unchanged:  PTH 112 will monitor   PREVIOUS   4. Brain tumor: unchanged:  is status post surgery X3; has headaches: will continue tylenol 650 mg four times  daily. Has vicodin 5/325 mg every 8 hours as needed for pain.   5. Vascular dementia without behavioral disturbance: without change:  is on aricept 10 mg nightly and namenda xr 28 mg daily  His weight is 143 pounds will monitor   6. Hypercalcemia: without change ca level 12.0;  vit D 7.4   MD is aware of resident's narcotic use and is in agreement with current plan of care. We will attempt to wean resident as apropriate   Ok Edwards NP [facility]  Adult Medicine  Contact [phone] Monday through Friday 8am- 5pm  After hours call [phone]

## 2018-02-24 DIAGNOSIS — Z79899 Other long term (current) drug therapy: Secondary | ICD-10-CM | POA: Diagnosis not present

## 2018-02-24 DIAGNOSIS — D649 Anemia, unspecified: Secondary | ICD-10-CM | POA: Diagnosis not present

## 2018-02-24 LAB — BASIC METABOLIC PANEL
BUN: 20 (ref 4–21)
Creatinine: 1.2 (ref 0.6–1.3)
Glucose: 94
Potassium: 4.8 (ref 3.4–5.3)
Sodium: 140 (ref 137–147)

## 2018-02-24 LAB — HEPATIC FUNCTION PANEL
ALK PHOS: 130 — AB (ref 25–125)
ALT: 21 (ref 10–40)
AST: 18 (ref 14–40)
BILIRUBIN, TOTAL: 0.3

## 2018-02-24 IMAGING — CT CT CERVICAL SPINE W/O CM
3 of 9 series · 9 of 33 positions shown, 10 images · non-contrast
Comparison: None.

July 08, 2014,April 25, 2013

CLINICAL DATA: Status post fall. Patient has a history of brain
surgery.

EXAM:
CT HEAD WITHOUT CONTRAST
CT CERVICAL SPINE WITHOUT CONTRAST
TECHNIQUE: Multidetector CT imaging of the head and cervical spine was
performed following the standard protocol without intravenous
contrast. Multiplanar CT image reconstructions of the cervical spine
were also generated.

[Series 9: c_spine 2.0 st · axial · 0.32mm/px · z∈[-190,-52]mm · 3 of 70 slices shown, 4 images]
[im 1/70  soft-tissue]
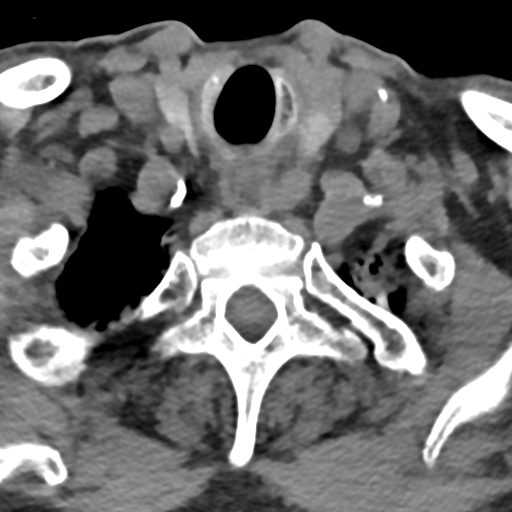
[im 1/70  bone]
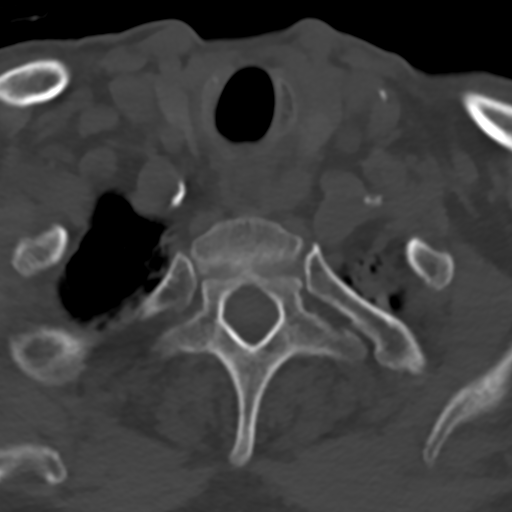
[im 35/70  bone]
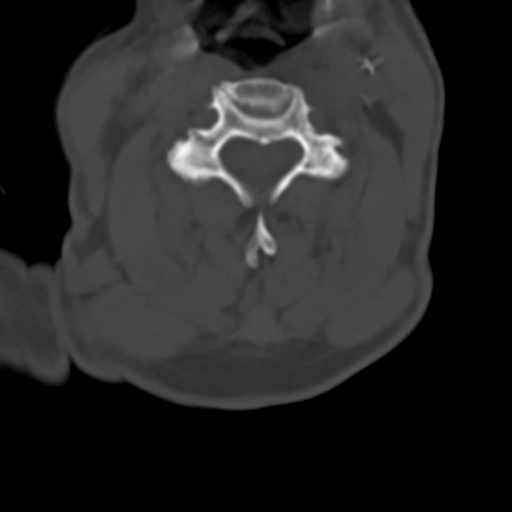
[im 70/70  bone]
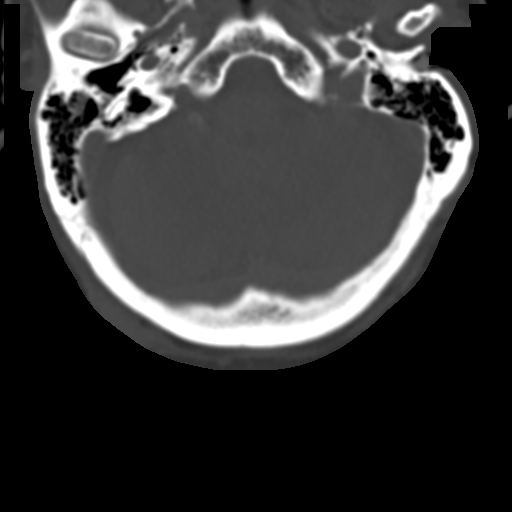

[Series 10: coronal bone · coronal · 0.23mm/px · 1 of 61 slices shown]
[im 31/61  bone]
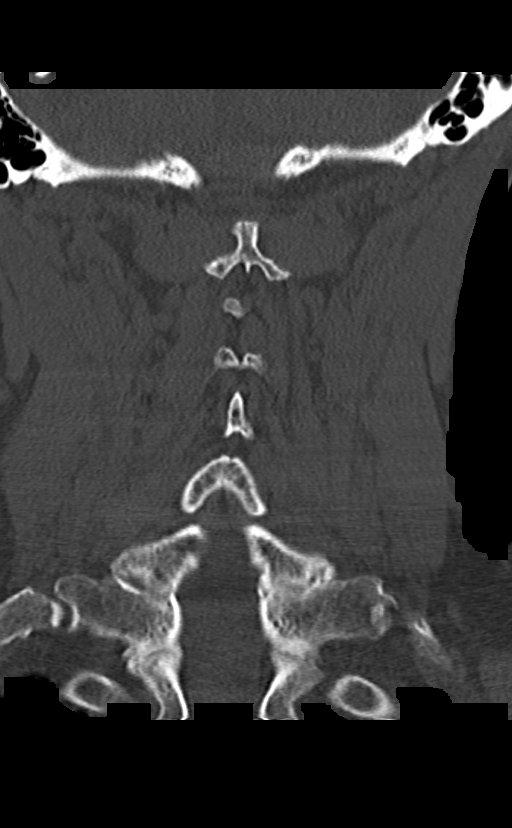

[Series 11: sagittal bone · sagittal · 0.23mm/px · 5 of 61 slices shown]
[im 11/61  bone]
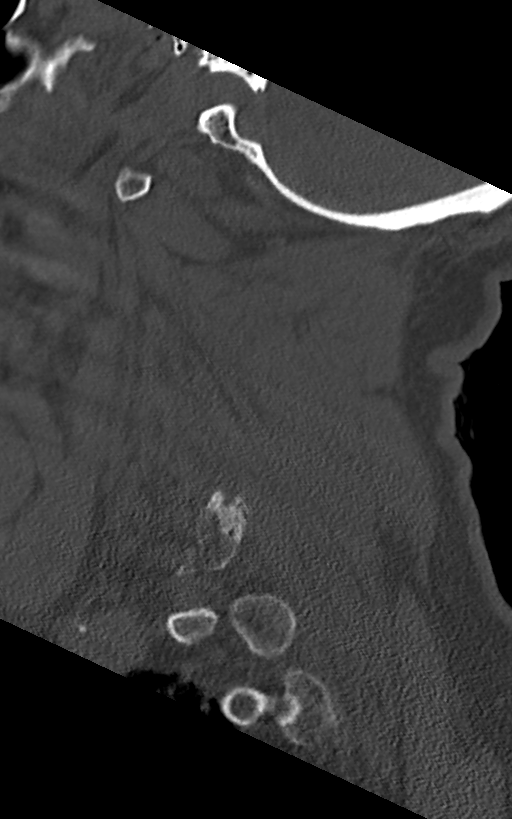
[im 21/61  bone]
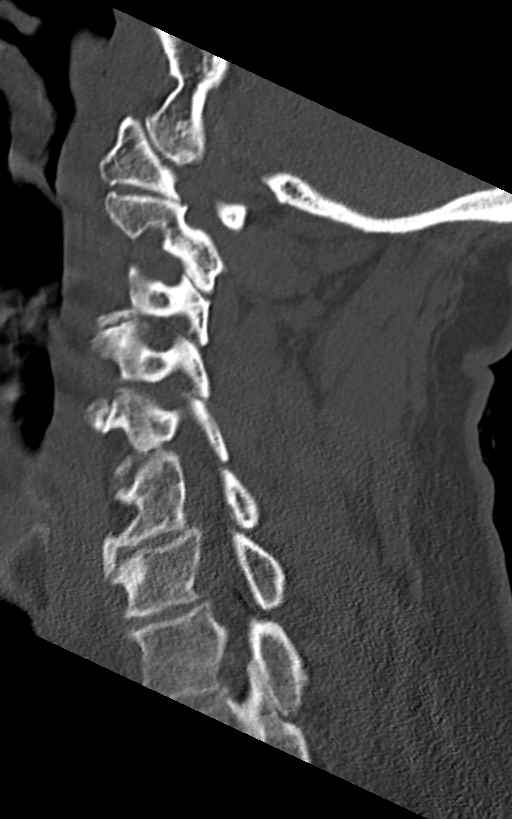
[im 31/61  bone]
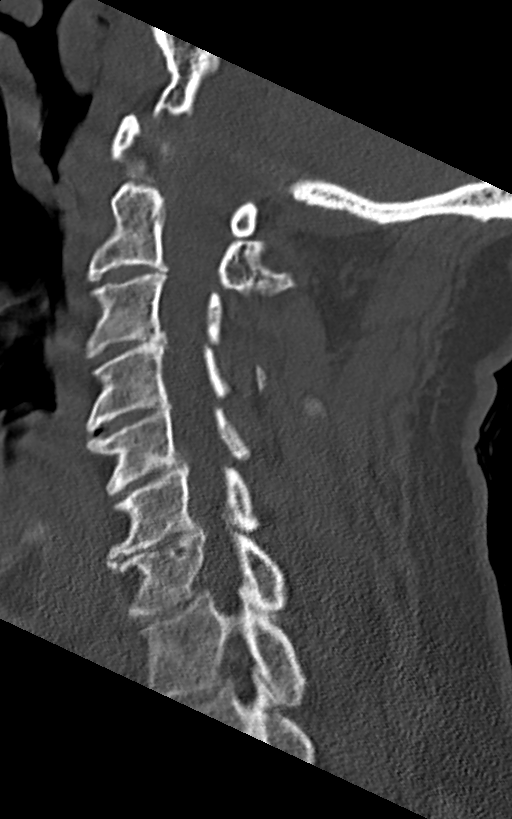
[im 41/61  bone]
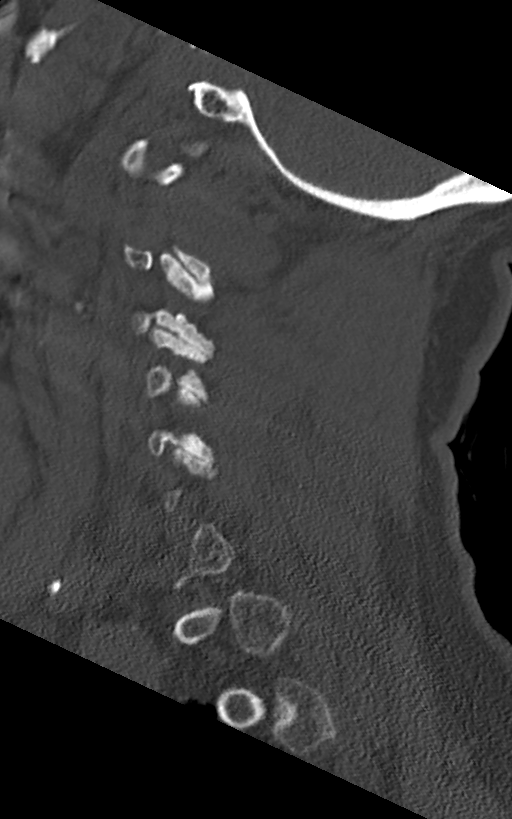
[im 51/61  bone]
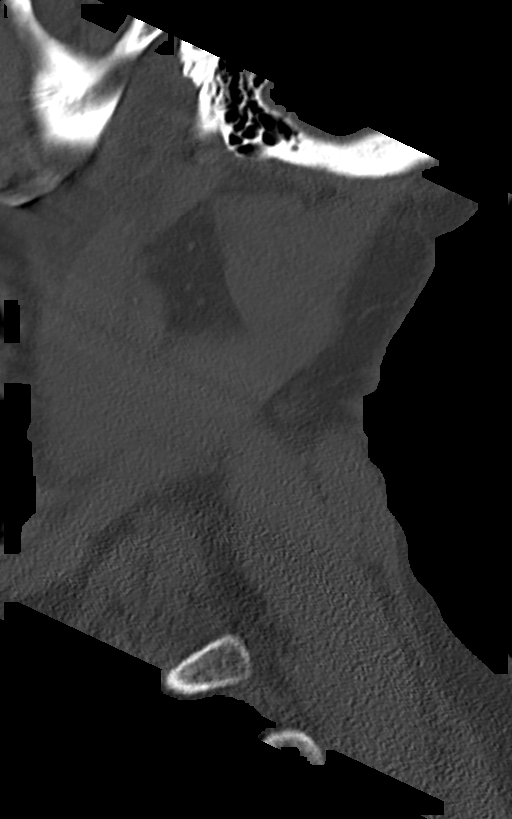

[9 of 33 positions shown; findings below may reference images not displayed]

FINDINGS: CT HEAD FINDINGS

Brain: No evidence of acute infarction, hemorrhage, hydrocephalus,
extra-axial collection or mass lesion/mass effect. There is chronic
diffuse atrophy. Chronic bilateral periventricular white matter
small vessel ischemic changes identified. Small low-density is
identified in the left frontal white matter chronic unchanged
compared prior exam in 3551. Small old lacunar infarctions in both
basal ganglia are noted.

Vascular: No hyperdense vessel or unexpected calcification.

Skull: Normal. Negative for fracture or focal lesion.

Sinuses/Orbits: No acute finding.

Other: None.

CT CERVICAL SPINE FINDINGS

Alignment: Normal.

Skull base and vertebrae: No acute fracture. No primary bone lesion
or focal pathologic process.

Soft tissues and spinal canal: No prevertebral fluid or swelling. No
visible canal hematoma.

Disc levels: Degenerative joint changes of the spine with narrowed
joint space and osteophyte formation are identified throughout
cervical spine.

Upper chest: Biapical scar are noted.

Other: None.
IMPRESSION: No focal acute intracranial abnormality identified.

No acute fracture or dislocation of cervical spine.

## 2018-02-27 ENCOUNTER — Encounter: Payer: Self-pay | Admitting: Adult Health

## 2018-02-27 DIAGNOSIS — F4323 Adjustment disorder with mixed anxiety and depressed mood: Secondary | ICD-10-CM | POA: Diagnosis not present

## 2018-03-02 DIAGNOSIS — M6281 Muscle weakness (generalized): Secondary | ICD-10-CM | POA: Diagnosis not present

## 2018-03-02 DIAGNOSIS — B351 Tinea unguium: Secondary | ICD-10-CM | POA: Diagnosis not present

## 2018-03-02 DIAGNOSIS — I739 Peripheral vascular disease, unspecified: Secondary | ICD-10-CM | POA: Diagnosis not present

## 2018-03-03 ENCOUNTER — Encounter (HOSPITAL_COMMUNITY): Payer: Self-pay | Admitting: Emergency Medicine

## 2018-03-03 ENCOUNTER — Emergency Department (HOSPITAL_COMMUNITY)
Admission: EM | Admit: 2018-03-03 | Discharge: 2018-03-04 | Payer: Medicare Other | Attending: Emergency Medicine | Admitting: Emergency Medicine

## 2018-03-03 ENCOUNTER — Emergency Department (HOSPITAL_COMMUNITY): Payer: Medicare Other

## 2018-03-03 DIAGNOSIS — F0390 Unspecified dementia without behavioral disturbance: Secondary | ICD-10-CM | POA: Insufficient documentation

## 2018-03-03 DIAGNOSIS — Z87891 Personal history of nicotine dependence: Secondary | ICD-10-CM | POA: Insufficient documentation

## 2018-03-03 DIAGNOSIS — E860 Dehydration: Secondary | ICD-10-CM | POA: Diagnosis not present

## 2018-03-03 DIAGNOSIS — W19XXXA Unspecified fall, initial encounter: Secondary | ICD-10-CM | POA: Diagnosis not present

## 2018-03-03 DIAGNOSIS — I10 Essential (primary) hypertension: Secondary | ICD-10-CM | POA: Insufficient documentation

## 2018-03-03 DIAGNOSIS — N3000 Acute cystitis without hematuria: Secondary | ICD-10-CM | POA: Diagnosis not present

## 2018-03-03 DIAGNOSIS — N3001 Acute cystitis with hematuria: Secondary | ICD-10-CM | POA: Diagnosis not present

## 2018-03-03 DIAGNOSIS — R000 Tachycardia, unspecified: Secondary | ICD-10-CM | POA: Diagnosis not present

## 2018-03-03 DIAGNOSIS — Z79899 Other long term (current) drug therapy: Secondary | ICD-10-CM | POA: Diagnosis not present

## 2018-03-03 DIAGNOSIS — J9811 Atelectasis: Secondary | ICD-10-CM | POA: Diagnosis not present

## 2018-03-03 DIAGNOSIS — I491 Atrial premature depolarization: Secondary | ICD-10-CM | POA: Diagnosis not present

## 2018-03-03 DIAGNOSIS — I4891 Unspecified atrial fibrillation: Secondary | ICD-10-CM | POA: Diagnosis not present

## 2018-03-03 NOTE — ED Triage Notes (Signed)
  Patient BIB EMS from [facility] for tachycardia.  Care team at SNF stated patient was in AFIB and had HR of 130, was given 50mg  of metoprolol which brought it down.  EMS stated patient was never in AFIB but had occasional PVCs.  Patient is A&O x 4.  No pain.  States he is not sure why he is here.  Patient has hx of dementia. VSS.

## 2018-03-03 NOTE — ED Provider Notes (Signed)
[facility] EMERGENCY DEPARTMENT Provider Note   CSN: [id number] Arrival date & time: 03/03/18  2109     History   Chief Complaint Chief Complaint  Patient presents with  . Headache    HPI Richard Mack is a 84 y.o. male.  Patient sent to the emergency department by ambulance from nursing home because of tachycardia.  Nursing home reported that the patient's heart rate was 130 and they were concerned that he was in atrial fibrillation.  He was given 50 mg of Lopressor with improvement of his heart rate, but was still over 100 bpm so sent the patient to the ER.  EMS reports that the patient was in sinus tachycardia, no evidence of A. fib.  Upon arrival to the ER patient is without complaints.  He has a history of dementia, is unsure why he is here. Level V Caveat due to dementia.     Past Medical History:  Diagnosis Date  . Brain tumor ([place])   . Chronic constipation   . Headache(784.0)   . Hypercalcemia   . Hypercholesteremia   . Hypertension   . Insomnia   . Irregular heartbeat     Patient Active Problem List   Diagnosis Date Noted  . Vitamin D deficiency 05/15/2017  . Orthostatic hypotension   . Syncope and collapse   . History of brain tumor 04/02/2017  . Chronic headaches 11/15/2016  . Vascular dementia without behavioral disturbance ([place]) 08/17/2016  . Brain tumor ([place])   . Osteoporosis 07/01/2009  . Primary hyperparathyroidism ([place]) 01/09/2009  . Hypercalcemia 01/09/2009  . Anxiety state 01/09/2009  . Essential hypertension 01/09/2009    Past Surgical History:  Procedure Laterality Date  . BRAIN SURGERY     x3  . SHOULDER SURGERY     right        Home Medications    Prior to Admission medications   Medication Sig Start Date End Date Taking? Authorizing Provider  acetaminophen (TYLENOL) 325 MG tablet Take 650 mg by mouth 4 (four) times daily.  02/16/18  Yes [provider]  donepezil (ARICEPT) 10 MG tablet Take 10 mg by  mouth at bedtime.    Yes [provider]  HYDROcodone-acetaminophen (NORCO/VICODIN) 5-325 MG tablet Take 1 tablet by mouth every 8 (eight) hours as needed for moderate pain. 08/31/17  Yes Eulas Post, Monica, DO  memantine (NAMENDA XR) 28 MG CP24 24 hr capsule Take 28 mg by mouth daily.   Yes [provider]  sulfamethoxazole-trimethoprim (BACTRIM DS,SEPTRA DS) 800-160 MG tablet Take 1 tablet by mouth 2 (two) times daily. 03/04/18   Orpah Greek, MD    Family History Family History  Problem Relation Age of Onset  . Appendicitis Father   . AAA (abdominal aortic aneurysm) Father     Social History Social History   Tobacco Use  . Smoking status: Former Smoker    Years: 1.00    Types: Cigarettes    Last attempt to quit: 09/19/1976    Years since quitting: 41.4  . Smokeless tobacco: Never Used  Substance Use Topics  . Alcohol use: No  . Drug use: No     Allergies   Cephalexin   Review of Systems Review of Systems  Unable to perform ROS: Dementia     Physical Exam Updated Vital Signs BP (!) 124/58   Pulse 85   Temp 99.4 F (37.4 C) (Rectal)   Resp 15   SpO2 99%   Physical Exam  Constitutional: He  appears well-developed and well-nourished. No distress.  HENT:  Head: Normocephalic and atraumatic.  Right Ear: Hearing normal.  Left Ear: Hearing normal.  Nose: Nose normal.  Mouth/Throat: Oropharynx is clear and moist and mucous membranes are normal.  Eyes: Pupils are equal, round, and reactive to light. Conjunctivae and EOM are normal.  Neck: Normal range of motion. Neck supple.  Cardiovascular: Regular rhythm, S1 normal and S2 normal. Exam reveals no gallop and no friction rub.  No murmur heard. Pulmonary/Chest: Effort normal and breath sounds normal. No respiratory distress. He exhibits no tenderness.  Abdominal: Soft. Normal appearance and bowel sounds are normal. There is no hepatosplenomegaly. There is no tenderness. There is no rebound, no  guarding, no tenderness at McBurney's point and negative Murphy's sign. No hernia.  Musculoskeletal: Normal range of motion.  Neurological: He is alert. He has normal strength. No cranial nerve deficit or sensory deficit. Coordination normal. GCS eye subscore is 4. GCS verbal subscore is 4. GCS motor subscore is 6.  Skin: Skin is warm, dry and intact. No rash noted. No cyanosis.  Psychiatric: He has a normal mood and affect. His speech is normal and behavior is normal. Cognition and memory are impaired.  Nursing note and vitals reviewed.    ED Treatments / Results  Labs (all labs ordered are listed, but only abnormal results are displayed) Labs Reviewed  CBC WITH DIFFERENTIAL/PLATELET - Abnormal; Notable for the following components:      Result Value   WBC 22.2 (*)    RBC 4.02 (*)    Hemoglobin 12.3 (*)    Neutro Abs 20.1 (*)    Lymphs Abs 0.3 (*)    Monocytes Absolute 1.6 (*)    Abs Immature Granulocytes 0.20 (*)    All other components within normal limits  COMPREHENSIVE METABOLIC PANEL - Abnormal; Notable for the following components:   Glucose, Bld 114 (*)    Creatinine, Ser 1.49 (*)    Calcium 11.3 (*)    Total Protein 6.4 (*)    GFR calc non Af Amer 40 (*)    GFR calc Af Amer 47 (*)    All other components within normal limits  URINALYSIS, ROUTINE W REFLEX MICROSCOPIC - Abnormal; Notable for the following components:   APPearance HAZY (*)    Leukocytes, UA MODERATE (*)    Bacteria, UA RARE (*)    All other components within normal limits  LACTIC ACID, PLASMA - Abnormal; Notable for the following components:   Lactic Acid, Venous 2.2 (*)    All other components within normal limits  URINE CULTURE    EKG EKG Interpretation  Date/Time:  Friday March 03 2018 21:20:03 EDT Ventricular Rate:  106 PR Interval:    QRS Duration: 76 QT Interval:  320 QTC Calculation: 425 R Axis:   19 Text Interpretation:  Sinus tachycardia Otherwise within normal limits Confirmed by  Orpah Greek [phone]) on 03/03/2018 11:15:09 PM   Radiology Dg Chest 2 View  Result Date: 03/04/2018 CLINICAL DATA:  Atrial fibrillation, palpitations. EXAM: CHEST - 2 VIEW COMPARISON:  Chest radiograph April 21, 2017 FINDINGS: Cardiomediastinal silhouette is unremarkable for this low inspiratory examination with crowded vasculature markings. Calcified aortic arch. The lungs are clear without pleural effusions or focal consolidations. Faint bibasilar strandy densities. Biapical pleural thickening. Trachea projects midline and there is no pneumothorax. Included soft tissue planes and osseous structures are non-suspicious. Stable degenerative change of the shoulders. IMPRESSION: Similar bibasilar atelectasis in this low inspiratory examination. Aortic Atherosclerosis (  ICD10-I70.0). Electronically Signed   By: Elon Alas M.D.   On: 03/04/2018 00:23    Procedures Procedures (including critical care time)  Medications Ordered in ED Medications  sulfamethoxazole-trimethoprim (BACTRIM DS,SEPTRA DS) 800-160 MG per tablet 1 tablet (has no administration in time range)     Initial Impression / Assessment and Plan / ED Course  I have reviewed the triage vital signs and the nursing notes.  Pertinent labs & imaging results that were available during my care of the patient were reviewed by me and considered in my medical decision making (see chart for details).     Patient presents to the emergency department for evaluation of elevated heart rate.  Nursing home thought he might be in atrial fibrillation, however, EMS confirms sinus tachycardia.  Patient's heart rate was in the 120 range upon arrival.  He appeared somewhat dry, was given IV fluids and has had resolution of his tachycardia.  Because of the tachycardia, however, further work-up was initiated.  He has an elevated white blood cell counts but no fever.  Blood pressure has been normal throughout visit.  No respiratory  complaints, chest x-ray clear, oxygenation 99% on room air.  Urinalysis does suggest infection.  He has not had any recent urine cultures to suggest pathogen.  Patient cannot take cephalosporins.  He will therefore be initiated on Bactrim.  As his vital signs have been completely normal, he does not appear to be septic at this time, will discharge back to nursing home with continued Bactrim, return for any fever or worsening infectious symptoms.  Final Clinical Impressions(s) / ED Diagnoses   Final diagnoses:  Dehydration  Acute cystitis without hematuria    ED Discharge Orders         Ordered    sulfamethoxazole-trimethoprim (BACTRIM DS,SEPTRA DS) 800-160 MG tablet  2 times daily     03/04/18 0503           Orpah Greek, MD 03/04/18 [phone]

## 2018-03-04 DIAGNOSIS — E860 Dehydration: Secondary | ICD-10-CM | POA: Diagnosis not present

## 2018-03-04 DIAGNOSIS — Z7401 Bed confinement status: Secondary | ICD-10-CM | POA: Diagnosis not present

## 2018-03-04 DIAGNOSIS — M2550 Pain in unspecified joint: Secondary | ICD-10-CM | POA: Diagnosis not present

## 2018-03-04 LAB — URINALYSIS, ROUTINE W REFLEX MICROSCOPIC
BILIRUBIN URINE: NEGATIVE
Glucose, UA: NEGATIVE mg/dL
Hgb urine dipstick: NEGATIVE
Ketones, ur: NEGATIVE mg/dL
Nitrite: NEGATIVE
PROTEIN: NEGATIVE mg/dL
Specific Gravity, Urine: 1.023 (ref 1.005–1.030)
pH: 5 (ref 5.0–8.0)

## 2018-03-04 LAB — CBC WITH DIFFERENTIAL/PLATELET
Abs Immature Granulocytes: 0.2 10*3/uL — ABNORMAL HIGH (ref 0.00–0.07)
BASOS ABS: 0 10*3/uL (ref 0.0–0.1)
Basophils Relative: 0 %
EOS PCT: 0 %
Eosinophils Absolute: 0.1 10*3/uL (ref 0.0–0.5)
HEMATOCRIT: 39.5 % (ref 39.0–52.0)
HEMOGLOBIN: 12.3 g/dL — AB (ref 13.0–17.0)
Immature Granulocytes: 1 %
LYMPHS ABS: 0.3 10*3/uL — AB (ref 0.7–4.0)
LYMPHS PCT: 1 %
MCH: 30.6 pg (ref 26.0–34.0)
MCHC: 31.1 g/dL (ref 30.0–36.0)
MCV: 98.3 fL (ref 80.0–100.0)
MONO ABS: 1.6 10*3/uL — AB (ref 0.1–1.0)
MONOS PCT: 7 %
NRBC: 0 % (ref 0.0–0.2)
Neutro Abs: 20.1 10*3/uL — ABNORMAL HIGH (ref 1.7–7.7)
Neutrophils Relative %: 91 %
Platelets: 196 10*3/uL (ref 150–400)
RBC: 4.02 MIL/uL — ABNORMAL LOW (ref 4.22–5.81)
RDW: 11.7 % (ref 11.5–15.5)
WBC: 22.2 10*3/uL — ABNORMAL HIGH (ref 4.0–10.5)

## 2018-03-04 LAB — COMPREHENSIVE METABOLIC PANEL
ALBUMIN: 3.6 g/dL (ref 3.5–5.0)
ALK PHOS: 82 U/L (ref 38–126)
ALT: 18 U/L (ref 0–44)
ANION GAP: 5 (ref 5–15)
AST: 24 U/L (ref 15–41)
BUN: 22 mg/dL (ref 8–23)
CALCIUM: 11.3 mg/dL — AB (ref 8.9–10.3)
CO2: 24 mmol/L (ref 22–32)
Chloride: 108 mmol/L (ref 98–111)
Creatinine, Ser: 1.49 mg/dL — ABNORMAL HIGH (ref 0.61–1.24)
GFR calc Af Amer: 47 mL/min — ABNORMAL LOW (ref >60)
GFR calc non Af Amer: 40 mL/min — ABNORMAL LOW (ref >60)
GLUCOSE: 114 mg/dL — AB (ref 70–99)
Potassium: 4.4 mmol/L (ref 3.5–5.1)
SODIUM: 137 mmol/L (ref 135–145)
Total Bilirubin: 1 mg/dL (ref 0.3–1.2)
Total Protein: 6.4 g/dL — ABNORMAL LOW (ref 6.5–8.1)

## 2018-03-04 LAB — LACTIC ACID, PLASMA: LACTIC ACID, VENOUS: 2.2 mmol/L — AB (ref 0.5–1.9)

## 2018-03-04 MED ORDER — SULFAMETHOXAZOLE-TRIMETHOPRIM 800-160 MG PO TABS
1.0000 | ORAL_TABLET | Freq: Once | ORAL | Status: AC
  Administered 2018-03-04: 1 via ORAL
  Filled 2018-03-04: qty 1

## 2018-03-04 MED ORDER — SULFAMETHOXAZOLE-TRIMETHOPRIM 800-160 MG PO TABS: 1.0000 | ORAL_TABLET | Freq: Two times a day (BID) | ORAL | 0 refills | Status: AC

## 2018-03-04 NOTE — ED Notes (Signed)
[facility] called for transport.  

## 2018-03-06 DIAGNOSIS — F0390 Unspecified dementia without behavioral disturbance: Secondary | ICD-10-CM | POA: Diagnosis not present

## 2018-03-06 DIAGNOSIS — I10 Essential (primary) hypertension: Secondary | ICD-10-CM | POA: Diagnosis not present

## 2018-03-06 DIAGNOSIS — R2689 Other abnormalities of gait and mobility: Secondary | ICD-10-CM | POA: Diagnosis not present

## 2018-03-06 DIAGNOSIS — M6281 Muscle weakness (generalized): Secondary | ICD-10-CM | POA: Diagnosis not present

## 2018-03-06 LAB — URINE CULTURE

## 2018-03-07 ENCOUNTER — Telehealth: Payer: Self-pay | Admitting: Emergency Medicine

## 2018-03-07 NOTE — Progress Notes (Signed)
ED Antimicrobial Stewardship Positive Culture Follow Up   Richard Mack is an 84 y.o. male who presented to [facility] on 03/03/2018 with a chief complaint of  Chief Complaint  Patient presents with  . Headache    Recent Results (from the past 720 hour(s))  Urine Culture     Status: Abnormal   Collection Time: 03/04/18 10:37 AM  Result Value Ref Range Status   Specimen Description URINE, RANDOM  Final   Special Requests NONE  Final   Culture (A)  Final    >=100,000 COLONIES/mL AEROCOCCUS URINAE Standardized susceptibility testing for this organism is not available. Performed at [facility], [place] [address] [place], [place] [address]    Report Status 03/06/2018 FINAL  Final    [x]  Treated with Bactrim, organism resistant to prescribed antimicrobial  New antibiotic prescription change: No symptoms for UTI, no treatment needed. Discontinue Bactrim.  ED Provider: America Brown PA-C   Thank you for allowing pharmacy to be a part of this patient's care.  Tamela Gammon, PharmD 03/07/2018 9:43 AM PGY-1 Pharmacy Resident Monday - Friday phone -  [phone] Saturday - Sunday phone - [phone]

## 2018-03-07 NOTE — Telephone Encounter (Signed)
Post ED Visit - Positive Culture Follow-up  Culture report reviewed by antimicrobial stewardship pharmacist:  []  Elenor Quinones, Pharm.D. []  Heide Guile, Pharm.D., BCPS AQ-ID []  Parks Neptune, Pharm.D., BCPS []  Alycia Rossetti, Pharm.D., BCPS []  [place], Pharm.D., BCPS, AAHIVP []  Legrand Como, Pharm.D., BCPS, AAHIVP []  Salome Arnt, PharmD, BCPS []  Johnnette Gourd, PharmD, BCPS []  Hughes Better, PharmD, BCPS []  Leeroy Cha, PharmD Drema Dallas PharmD  Positive urine culture Treated with sulfamethoxazole trimethoprim , organism sensitive to the same and no further patient follow-up is required at this time.  Hazle Nordmann 03/07/2018, 3:49 PM

## 2018-03-16 DIAGNOSIS — G4452 New daily persistent headache (NDPH): Secondary | ICD-10-CM | POA: Diagnosis not present

## 2018-03-16 DIAGNOSIS — C719 Malignant neoplasm of brain, unspecified: Secondary | ICD-10-CM | POA: Diagnosis not present

## 2018-03-16 DIAGNOSIS — F0280 Dementia in other diseases classified elsewhere without behavioral disturbance: Secondary | ICD-10-CM | POA: Diagnosis not present

## 2018-03-16 DIAGNOSIS — Z515 Encounter for palliative care: Secondary | ICD-10-CM | POA: Diagnosis not present

## 2018-03-16 DIAGNOSIS — K5900 Constipation, unspecified: Secondary | ICD-10-CM | POA: Diagnosis not present

## 2018-03-16 DIAGNOSIS — I10 Essential (primary) hypertension: Secondary | ICD-10-CM | POA: Diagnosis not present

## 2018-03-16 DIAGNOSIS — G311 Senile degeneration of brain, not elsewhere classified: Secondary | ICD-10-CM | POA: Diagnosis not present

## 2018-03-16 DIAGNOSIS — R32 Unspecified urinary incontinence: Secondary | ICD-10-CM | POA: Diagnosis not present

## 2018-03-17 DIAGNOSIS — G311 Senile degeneration of brain, not elsewhere classified: Secondary | ICD-10-CM | POA: Diagnosis not present

## 2018-03-17 DIAGNOSIS — F0280 Dementia in other diseases classified elsewhere without behavioral disturbance: Secondary | ICD-10-CM | POA: Diagnosis not present

## 2018-03-17 DIAGNOSIS — R32 Unspecified urinary incontinence: Secondary | ICD-10-CM | POA: Diagnosis not present

## 2018-03-17 DIAGNOSIS — G4452 New daily persistent headache (NDPH): Secondary | ICD-10-CM | POA: Diagnosis not present

## 2018-03-17 DIAGNOSIS — I10 Essential (primary) hypertension: Secondary | ICD-10-CM | POA: Diagnosis not present

## 2018-03-17 DIAGNOSIS — C719 Malignant neoplasm of brain, unspecified: Secondary | ICD-10-CM | POA: Diagnosis not present

## 2018-03-21 DIAGNOSIS — R32 Unspecified urinary incontinence: Secondary | ICD-10-CM | POA: Diagnosis not present

## 2018-03-21 DIAGNOSIS — G311 Senile degeneration of brain, not elsewhere classified: Secondary | ICD-10-CM | POA: Diagnosis not present

## 2018-03-21 DIAGNOSIS — I10 Essential (primary) hypertension: Secondary | ICD-10-CM | POA: Diagnosis not present

## 2018-03-21 DIAGNOSIS — G301 Alzheimer's disease with late onset: Secondary | ICD-10-CM | POA: Diagnosis not present

## 2018-03-21 DIAGNOSIS — F0280 Dementia in other diseases classified elsewhere without behavioral disturbance: Secondary | ICD-10-CM | POA: Diagnosis not present

## 2018-03-21 DIAGNOSIS — F0150 Vascular dementia without behavioral disturbance: Secondary | ICD-10-CM | POA: Diagnosis not present

## 2018-03-21 DIAGNOSIS — C719 Malignant neoplasm of brain, unspecified: Secondary | ICD-10-CM | POA: Diagnosis not present

## 2018-03-21 DIAGNOSIS — G4452 New daily persistent headache (NDPH): Secondary | ICD-10-CM | POA: Diagnosis not present

## 2018-03-21 DIAGNOSIS — F419 Anxiety disorder, unspecified: Secondary | ICD-10-CM | POA: Diagnosis not present

## 2018-03-23 DIAGNOSIS — F0280 Dementia in other diseases classified elsewhere without behavioral disturbance: Secondary | ICD-10-CM | POA: Diagnosis not present

## 2018-03-23 DIAGNOSIS — R32 Unspecified urinary incontinence: Secondary | ICD-10-CM | POA: Diagnosis not present

## 2018-03-23 DIAGNOSIS — C719 Malignant neoplasm of brain, unspecified: Secondary | ICD-10-CM | POA: Diagnosis not present

## 2018-03-23 DIAGNOSIS — G4452 New daily persistent headache (NDPH): Secondary | ICD-10-CM | POA: Diagnosis not present

## 2018-03-23 DIAGNOSIS — I10 Essential (primary) hypertension: Secondary | ICD-10-CM | POA: Diagnosis not present

## 2018-03-23 DIAGNOSIS — G311 Senile degeneration of brain, not elsewhere classified: Secondary | ICD-10-CM | POA: Diagnosis not present

## 2018-03-27 DIAGNOSIS — I10 Essential (primary) hypertension: Secondary | ICD-10-CM | POA: Diagnosis not present

## 2018-03-27 DIAGNOSIS — M6281 Muscle weakness (generalized): Secondary | ICD-10-CM | POA: Diagnosis not present

## 2018-03-27 DIAGNOSIS — F0390 Unspecified dementia without behavioral disturbance: Secondary | ICD-10-CM | POA: Diagnosis not present

## 2018-03-27 DIAGNOSIS — F4323 Adjustment disorder with mixed anxiety and depressed mood: Secondary | ICD-10-CM | POA: Diagnosis not present

## 2018-03-27 DIAGNOSIS — R55 Syncope and collapse: Secondary | ICD-10-CM | POA: Diagnosis not present

## 2018-03-28 DIAGNOSIS — C719 Malignant neoplasm of brain, unspecified: Secondary | ICD-10-CM | POA: Diagnosis not present

## 2018-03-28 DIAGNOSIS — G4452 New daily persistent headache (NDPH): Secondary | ICD-10-CM | POA: Diagnosis not present

## 2018-03-28 DIAGNOSIS — I10 Essential (primary) hypertension: Secondary | ICD-10-CM | POA: Diagnosis not present

## 2018-03-28 DIAGNOSIS — R32 Unspecified urinary incontinence: Secondary | ICD-10-CM | POA: Diagnosis not present

## 2018-03-28 DIAGNOSIS — F0280 Dementia in other diseases classified elsewhere without behavioral disturbance: Secondary | ICD-10-CM | POA: Diagnosis not present

## 2018-03-28 DIAGNOSIS — G311 Senile degeneration of brain, not elsewhere classified: Secondary | ICD-10-CM | POA: Diagnosis not present

## 2018-03-31 DIAGNOSIS — R32 Unspecified urinary incontinence: Secondary | ICD-10-CM | POA: Diagnosis not present

## 2018-03-31 DIAGNOSIS — G4452 New daily persistent headache (NDPH): Secondary | ICD-10-CM | POA: Diagnosis not present

## 2018-03-31 DIAGNOSIS — I10 Essential (primary) hypertension: Secondary | ICD-10-CM | POA: Diagnosis not present

## 2018-03-31 DIAGNOSIS — C719 Malignant neoplasm of brain, unspecified: Secondary | ICD-10-CM | POA: Diagnosis not present

## 2018-03-31 DIAGNOSIS — F0280 Dementia in other diseases classified elsewhere without behavioral disturbance: Secondary | ICD-10-CM | POA: Diagnosis not present

## 2018-03-31 DIAGNOSIS — G311 Senile degeneration of brain, not elsewhere classified: Secondary | ICD-10-CM | POA: Diagnosis not present

## 2018-04-02 DIAGNOSIS — F0280 Dementia in other diseases classified elsewhere without behavioral disturbance: Secondary | ICD-10-CM | POA: Diagnosis not present

## 2018-04-02 DIAGNOSIS — C719 Malignant neoplasm of brain, unspecified: Secondary | ICD-10-CM | POA: Diagnosis not present

## 2018-04-02 DIAGNOSIS — Z515 Encounter for palliative care: Secondary | ICD-10-CM | POA: Diagnosis not present

## 2018-04-02 DIAGNOSIS — K5900 Constipation, unspecified: Secondary | ICD-10-CM | POA: Diagnosis not present

## 2018-04-02 DIAGNOSIS — I10 Essential (primary) hypertension: Secondary | ICD-10-CM | POA: Diagnosis not present

## 2018-04-02 DIAGNOSIS — G4452 New daily persistent headache (NDPH): Secondary | ICD-10-CM | POA: Diagnosis not present

## 2018-04-02 DIAGNOSIS — G311 Senile degeneration of brain, not elsewhere classified: Secondary | ICD-10-CM | POA: Diagnosis not present

## 2018-04-02 DIAGNOSIS — R32 Unspecified urinary incontinence: Secondary | ICD-10-CM | POA: Diagnosis not present

## 2018-04-04 DIAGNOSIS — G311 Senile degeneration of brain, not elsewhere classified: Secondary | ICD-10-CM | POA: Diagnosis not present

## 2018-04-04 DIAGNOSIS — G4452 New daily persistent headache (NDPH): Secondary | ICD-10-CM | POA: Diagnosis not present

## 2018-04-04 DIAGNOSIS — I10 Essential (primary) hypertension: Secondary | ICD-10-CM | POA: Diagnosis not present

## 2018-04-04 DIAGNOSIS — F4323 Adjustment disorder with mixed anxiety and depressed mood: Secondary | ICD-10-CM | POA: Diagnosis not present

## 2018-04-04 DIAGNOSIS — F0280 Dementia in other diseases classified elsewhere without behavioral disturbance: Secondary | ICD-10-CM | POA: Diagnosis not present

## 2018-04-04 DIAGNOSIS — R32 Unspecified urinary incontinence: Secondary | ICD-10-CM | POA: Diagnosis not present

## 2018-04-04 DIAGNOSIS — C719 Malignant neoplasm of brain, unspecified: Secondary | ICD-10-CM | POA: Diagnosis not present

## 2018-04-06 DIAGNOSIS — G311 Senile degeneration of brain, not elsewhere classified: Secondary | ICD-10-CM | POA: Diagnosis not present

## 2018-04-06 DIAGNOSIS — G4452 New daily persistent headache (NDPH): Secondary | ICD-10-CM | POA: Diagnosis not present

## 2018-04-06 DIAGNOSIS — R32 Unspecified urinary incontinence: Secondary | ICD-10-CM | POA: Diagnosis not present

## 2018-04-06 DIAGNOSIS — C719 Malignant neoplasm of brain, unspecified: Secondary | ICD-10-CM | POA: Diagnosis not present

## 2018-04-06 DIAGNOSIS — I10 Essential (primary) hypertension: Secondary | ICD-10-CM | POA: Diagnosis not present

## 2018-04-06 DIAGNOSIS — F0280 Dementia in other diseases classified elsewhere without behavioral disturbance: Secondary | ICD-10-CM | POA: Diagnosis not present

## 2018-04-07 DIAGNOSIS — G4452 New daily persistent headache (NDPH): Secondary | ICD-10-CM | POA: Diagnosis not present

## 2018-04-07 DIAGNOSIS — F0280 Dementia in other diseases classified elsewhere without behavioral disturbance: Secondary | ICD-10-CM | POA: Diagnosis not present

## 2018-04-07 DIAGNOSIS — G311 Senile degeneration of brain, not elsewhere classified: Secondary | ICD-10-CM | POA: Diagnosis not present

## 2018-04-07 DIAGNOSIS — R32 Unspecified urinary incontinence: Secondary | ICD-10-CM | POA: Diagnosis not present

## 2018-04-07 DIAGNOSIS — R531 Weakness: Secondary | ICD-10-CM | POA: Diagnosis not present

## 2018-04-07 DIAGNOSIS — I10 Essential (primary) hypertension: Secondary | ICD-10-CM | POA: Diagnosis not present

## 2018-04-07 DIAGNOSIS — E785 Hyperlipidemia, unspecified: Secondary | ICD-10-CM | POA: Diagnosis not present

## 2018-04-07 DIAGNOSIS — C719 Malignant neoplasm of brain, unspecified: Secondary | ICD-10-CM | POA: Diagnosis not present

## 2018-04-07 DIAGNOSIS — F0390 Unspecified dementia without behavioral disturbance: Secondary | ICD-10-CM | POA: Diagnosis not present

## 2018-04-10 DIAGNOSIS — F4323 Adjustment disorder with mixed anxiety and depressed mood: Secondary | ICD-10-CM | POA: Diagnosis not present

## 2018-04-11 DIAGNOSIS — R32 Unspecified urinary incontinence: Secondary | ICD-10-CM | POA: Diagnosis not present

## 2018-04-11 DIAGNOSIS — I10 Essential (primary) hypertension: Secondary | ICD-10-CM | POA: Diagnosis not present

## 2018-04-11 DIAGNOSIS — G4452 New daily persistent headache (NDPH): Secondary | ICD-10-CM | POA: Diagnosis not present

## 2018-04-11 DIAGNOSIS — G311 Senile degeneration of brain, not elsewhere classified: Secondary | ICD-10-CM | POA: Diagnosis not present

## 2018-04-11 DIAGNOSIS — F0280 Dementia in other diseases classified elsewhere without behavioral disturbance: Secondary | ICD-10-CM | POA: Diagnosis not present

## 2018-04-11 DIAGNOSIS — C719 Malignant neoplasm of brain, unspecified: Secondary | ICD-10-CM | POA: Diagnosis not present

## 2018-04-13 DIAGNOSIS — F0280 Dementia in other diseases classified elsewhere without behavioral disturbance: Secondary | ICD-10-CM | POA: Diagnosis not present

## 2018-04-13 DIAGNOSIS — R32 Unspecified urinary incontinence: Secondary | ICD-10-CM | POA: Diagnosis not present

## 2018-04-13 DIAGNOSIS — I10 Essential (primary) hypertension: Secondary | ICD-10-CM | POA: Diagnosis not present

## 2018-04-13 DIAGNOSIS — C719 Malignant neoplasm of brain, unspecified: Secondary | ICD-10-CM | POA: Diagnosis not present

## 2018-04-13 DIAGNOSIS — G4452 New daily persistent headache (NDPH): Secondary | ICD-10-CM | POA: Diagnosis not present

## 2018-04-13 DIAGNOSIS — G311 Senile degeneration of brain, not elsewhere classified: Secondary | ICD-10-CM | POA: Diagnosis not present

## 2018-04-17 DIAGNOSIS — F4323 Adjustment disorder with mixed anxiety and depressed mood: Secondary | ICD-10-CM | POA: Diagnosis not present

## 2018-04-18 DIAGNOSIS — G4452 New daily persistent headache (NDPH): Secondary | ICD-10-CM | POA: Diagnosis not present

## 2018-04-18 DIAGNOSIS — F0280 Dementia in other diseases classified elsewhere without behavioral disturbance: Secondary | ICD-10-CM | POA: Diagnosis not present

## 2018-04-18 DIAGNOSIS — I10 Essential (primary) hypertension: Secondary | ICD-10-CM | POA: Diagnosis not present

## 2018-04-18 DIAGNOSIS — G311 Senile degeneration of brain, not elsewhere classified: Secondary | ICD-10-CM | POA: Diagnosis not present

## 2018-04-18 DIAGNOSIS — C719 Malignant neoplasm of brain, unspecified: Secondary | ICD-10-CM | POA: Diagnosis not present

## 2018-04-18 DIAGNOSIS — R32 Unspecified urinary incontinence: Secondary | ICD-10-CM | POA: Diagnosis not present

## 2018-04-19 DIAGNOSIS — C719 Malignant neoplasm of brain, unspecified: Secondary | ICD-10-CM | POA: Diagnosis not present

## 2018-04-19 DIAGNOSIS — F0280 Dementia in other diseases classified elsewhere without behavioral disturbance: Secondary | ICD-10-CM | POA: Diagnosis not present

## 2018-04-19 DIAGNOSIS — G4452 New daily persistent headache (NDPH): Secondary | ICD-10-CM | POA: Diagnosis not present

## 2018-04-19 DIAGNOSIS — R32 Unspecified urinary incontinence: Secondary | ICD-10-CM | POA: Diagnosis not present

## 2018-04-19 DIAGNOSIS — I10 Essential (primary) hypertension: Secondary | ICD-10-CM | POA: Diagnosis not present

## 2018-04-19 DIAGNOSIS — G311 Senile degeneration of brain, not elsewhere classified: Secondary | ICD-10-CM | POA: Diagnosis not present

## 2018-04-20 DIAGNOSIS — C719 Malignant neoplasm of brain, unspecified: Secondary | ICD-10-CM | POA: Diagnosis not present

## 2018-04-20 DIAGNOSIS — G311 Senile degeneration of brain, not elsewhere classified: Secondary | ICD-10-CM | POA: Diagnosis not present

## 2018-04-20 DIAGNOSIS — R32 Unspecified urinary incontinence: Secondary | ICD-10-CM | POA: Diagnosis not present

## 2018-04-20 DIAGNOSIS — I10 Essential (primary) hypertension: Secondary | ICD-10-CM | POA: Diagnosis not present

## 2018-04-20 DIAGNOSIS — G4452 New daily persistent headache (NDPH): Secondary | ICD-10-CM | POA: Diagnosis not present

## 2018-04-20 DIAGNOSIS — F0280 Dementia in other diseases classified elsewhere without behavioral disturbance: Secondary | ICD-10-CM | POA: Diagnosis not present

## 2018-04-21 DIAGNOSIS — G4452 New daily persistent headache (NDPH): Secondary | ICD-10-CM | POA: Diagnosis not present

## 2018-04-21 DIAGNOSIS — R32 Unspecified urinary incontinence: Secondary | ICD-10-CM | POA: Diagnosis not present

## 2018-04-21 DIAGNOSIS — C719 Malignant neoplasm of brain, unspecified: Secondary | ICD-10-CM | POA: Diagnosis not present

## 2018-04-21 DIAGNOSIS — I10 Essential (primary) hypertension: Secondary | ICD-10-CM | POA: Diagnosis not present

## 2018-04-21 DIAGNOSIS — G311 Senile degeneration of brain, not elsewhere classified: Secondary | ICD-10-CM | POA: Diagnosis not present

## 2018-04-21 DIAGNOSIS — F0280 Dementia in other diseases classified elsewhere without behavioral disturbance: Secondary | ICD-10-CM | POA: Diagnosis not present

## 2018-04-25 DIAGNOSIS — G311 Senile degeneration of brain, not elsewhere classified: Secondary | ICD-10-CM | POA: Diagnosis not present

## 2018-04-25 DIAGNOSIS — R32 Unspecified urinary incontinence: Secondary | ICD-10-CM | POA: Diagnosis not present

## 2018-04-25 DIAGNOSIS — I10 Essential (primary) hypertension: Secondary | ICD-10-CM | POA: Diagnosis not present

## 2018-04-25 DIAGNOSIS — F0280 Dementia in other diseases classified elsewhere without behavioral disturbance: Secondary | ICD-10-CM | POA: Diagnosis not present

## 2018-04-25 DIAGNOSIS — C719 Malignant neoplasm of brain, unspecified: Secondary | ICD-10-CM | POA: Diagnosis not present

## 2018-04-25 DIAGNOSIS — G4452 New daily persistent headache (NDPH): Secondary | ICD-10-CM | POA: Diagnosis not present

## 2018-04-27 DIAGNOSIS — G311 Senile degeneration of brain, not elsewhere classified: Secondary | ICD-10-CM | POA: Diagnosis not present

## 2018-04-27 DIAGNOSIS — I10 Essential (primary) hypertension: Secondary | ICD-10-CM | POA: Diagnosis not present

## 2018-04-27 DIAGNOSIS — R2689 Other abnormalities of gait and mobility: Secondary | ICD-10-CM | POA: Diagnosis not present

## 2018-04-27 DIAGNOSIS — R32 Unspecified urinary incontinence: Secondary | ICD-10-CM | POA: Diagnosis not present

## 2018-04-27 DIAGNOSIS — E785 Hyperlipidemia, unspecified: Secondary | ICD-10-CM | POA: Diagnosis not present

## 2018-04-27 DIAGNOSIS — C719 Malignant neoplasm of brain, unspecified: Secondary | ICD-10-CM | POA: Diagnosis not present

## 2018-04-27 DIAGNOSIS — F0390 Unspecified dementia without behavioral disturbance: Secondary | ICD-10-CM | POA: Diagnosis not present

## 2018-04-27 DIAGNOSIS — G4452 New daily persistent headache (NDPH): Secondary | ICD-10-CM | POA: Diagnosis not present

## 2018-04-27 DIAGNOSIS — R531 Weakness: Secondary | ICD-10-CM | POA: Diagnosis not present

## 2018-04-27 DIAGNOSIS — F0280 Dementia in other diseases classified elsewhere without behavioral disturbance: Secondary | ICD-10-CM | POA: Diagnosis not present

## 2018-05-02 DIAGNOSIS — F419 Anxiety disorder, unspecified: Secondary | ICD-10-CM | POA: Diagnosis not present

## 2018-05-02 DIAGNOSIS — F0150 Vascular dementia without behavioral disturbance: Secondary | ICD-10-CM | POA: Diagnosis not present

## 2018-05-02 DIAGNOSIS — G301 Alzheimer's disease with late onset: Secondary | ICD-10-CM | POA: Diagnosis not present

## 2018-05-03 DIAGNOSIS — C719 Malignant neoplasm of brain, unspecified: Secondary | ICD-10-CM | POA: Diagnosis not present

## 2018-05-03 DIAGNOSIS — K5900 Constipation, unspecified: Secondary | ICD-10-CM | POA: Diagnosis not present

## 2018-05-03 DIAGNOSIS — G311 Senile degeneration of brain, not elsewhere classified: Secondary | ICD-10-CM | POA: Diagnosis not present

## 2018-05-03 DIAGNOSIS — R32 Unspecified urinary incontinence: Secondary | ICD-10-CM | POA: Diagnosis not present

## 2018-05-03 DIAGNOSIS — F0280 Dementia in other diseases classified elsewhere without behavioral disturbance: Secondary | ICD-10-CM | POA: Diagnosis not present

## 2018-05-03 DIAGNOSIS — G4452 New daily persistent headache (NDPH): Secondary | ICD-10-CM | POA: Diagnosis not present

## 2018-05-03 DIAGNOSIS — Z515 Encounter for palliative care: Secondary | ICD-10-CM | POA: Diagnosis not present

## 2018-05-03 DIAGNOSIS — I10 Essential (primary) hypertension: Secondary | ICD-10-CM | POA: Diagnosis not present

## 2018-05-04 DIAGNOSIS — F0280 Dementia in other diseases classified elsewhere without behavioral disturbance: Secondary | ICD-10-CM | POA: Diagnosis not present

## 2018-05-04 DIAGNOSIS — G311 Senile degeneration of brain, not elsewhere classified: Secondary | ICD-10-CM | POA: Diagnosis not present

## 2018-05-04 DIAGNOSIS — R32 Unspecified urinary incontinence: Secondary | ICD-10-CM | POA: Diagnosis not present

## 2018-05-04 DIAGNOSIS — G4452 New daily persistent headache (NDPH): Secondary | ICD-10-CM | POA: Diagnosis not present

## 2018-05-04 DIAGNOSIS — I10 Essential (primary) hypertension: Secondary | ICD-10-CM | POA: Diagnosis not present

## 2018-05-04 DIAGNOSIS — C719 Malignant neoplasm of brain, unspecified: Secondary | ICD-10-CM | POA: Diagnosis not present

## 2018-05-06 DIAGNOSIS — F4323 Adjustment disorder with mixed anxiety and depressed mood: Secondary | ICD-10-CM | POA: Diagnosis not present

## 2018-05-08 DIAGNOSIS — I10 Essential (primary) hypertension: Secondary | ICD-10-CM | POA: Diagnosis not present

## 2018-05-08 DIAGNOSIS — G311 Senile degeneration of brain, not elsewhere classified: Secondary | ICD-10-CM | POA: Diagnosis not present

## 2018-05-08 DIAGNOSIS — R32 Unspecified urinary incontinence: Secondary | ICD-10-CM | POA: Diagnosis not present

## 2018-05-08 DIAGNOSIS — C719 Malignant neoplasm of brain, unspecified: Secondary | ICD-10-CM | POA: Diagnosis not present

## 2018-05-08 DIAGNOSIS — G4452 New daily persistent headache (NDPH): Secondary | ICD-10-CM | POA: Diagnosis not present

## 2018-05-08 DIAGNOSIS — F0280 Dementia in other diseases classified elsewhere without behavioral disturbance: Secondary | ICD-10-CM | POA: Diagnosis not present

## 2018-05-08 DIAGNOSIS — E785 Hyperlipidemia, unspecified: Secondary | ICD-10-CM | POA: Diagnosis not present

## 2018-05-08 DIAGNOSIS — R2689 Other abnormalities of gait and mobility: Secondary | ICD-10-CM | POA: Diagnosis not present

## 2018-05-08 DIAGNOSIS — F0390 Unspecified dementia without behavioral disturbance: Secondary | ICD-10-CM | POA: Diagnosis not present

## 2018-05-09 DIAGNOSIS — E119 Type 2 diabetes mellitus without complications: Secondary | ICD-10-CM | POA: Diagnosis not present

## 2018-05-09 DIAGNOSIS — E039 Hypothyroidism, unspecified: Secondary | ICD-10-CM | POA: Diagnosis not present

## 2018-05-09 DIAGNOSIS — D649 Anemia, unspecified: Secondary | ICD-10-CM | POA: Diagnosis not present

## 2018-05-09 DIAGNOSIS — E785 Hyperlipidemia, unspecified: Secondary | ICD-10-CM | POA: Diagnosis not present

## 2018-05-10 DIAGNOSIS — F4323 Adjustment disorder with mixed anxiety and depressed mood: Secondary | ICD-10-CM | POA: Diagnosis not present

## 2018-05-11 DIAGNOSIS — R32 Unspecified urinary incontinence: Secondary | ICD-10-CM | POA: Diagnosis not present

## 2018-05-11 DIAGNOSIS — F0280 Dementia in other diseases classified elsewhere without behavioral disturbance: Secondary | ICD-10-CM | POA: Diagnosis not present

## 2018-05-11 DIAGNOSIS — I10 Essential (primary) hypertension: Secondary | ICD-10-CM | POA: Diagnosis not present

## 2018-05-11 DIAGNOSIS — G311 Senile degeneration of brain, not elsewhere classified: Secondary | ICD-10-CM | POA: Diagnosis not present

## 2018-05-11 DIAGNOSIS — G4452 New daily persistent headache (NDPH): Secondary | ICD-10-CM | POA: Diagnosis not present

## 2018-05-11 DIAGNOSIS — C719 Malignant neoplasm of brain, unspecified: Secondary | ICD-10-CM | POA: Diagnosis not present

## 2018-05-13 DIAGNOSIS — R32 Unspecified urinary incontinence: Secondary | ICD-10-CM | POA: Diagnosis not present

## 2018-05-13 DIAGNOSIS — G311 Senile degeneration of brain, not elsewhere classified: Secondary | ICD-10-CM | POA: Diagnosis not present

## 2018-05-13 DIAGNOSIS — G4452 New daily persistent headache (NDPH): Secondary | ICD-10-CM | POA: Diagnosis not present

## 2018-05-13 DIAGNOSIS — C719 Malignant neoplasm of brain, unspecified: Secondary | ICD-10-CM | POA: Diagnosis not present

## 2018-05-13 DIAGNOSIS — I10 Essential (primary) hypertension: Secondary | ICD-10-CM | POA: Diagnosis not present

## 2018-05-13 DIAGNOSIS — F0280 Dementia in other diseases classified elsewhere without behavioral disturbance: Secondary | ICD-10-CM | POA: Diagnosis not present

## 2018-05-15 DIAGNOSIS — I10 Essential (primary) hypertension: Secondary | ICD-10-CM | POA: Diagnosis not present

## 2018-05-15 DIAGNOSIS — G4452 New daily persistent headache (NDPH): Secondary | ICD-10-CM | POA: Diagnosis not present

## 2018-05-15 DIAGNOSIS — C719 Malignant neoplasm of brain, unspecified: Secondary | ICD-10-CM | POA: Diagnosis not present

## 2018-05-15 DIAGNOSIS — F0280 Dementia in other diseases classified elsewhere without behavioral disturbance: Secondary | ICD-10-CM | POA: Diagnosis not present

## 2018-05-15 DIAGNOSIS — R32 Unspecified urinary incontinence: Secondary | ICD-10-CM | POA: Diagnosis not present

## 2018-05-15 DIAGNOSIS — G311 Senile degeneration of brain, not elsewhere classified: Secondary | ICD-10-CM | POA: Diagnosis not present

## 2018-05-17 DIAGNOSIS — F4323 Adjustment disorder with mixed anxiety and depressed mood: Secondary | ICD-10-CM | POA: Diagnosis not present

## 2018-05-18 DIAGNOSIS — G4452 New daily persistent headache (NDPH): Secondary | ICD-10-CM | POA: Diagnosis not present

## 2018-05-18 DIAGNOSIS — I10 Essential (primary) hypertension: Secondary | ICD-10-CM | POA: Diagnosis not present

## 2018-05-18 DIAGNOSIS — F419 Anxiety disorder, unspecified: Secondary | ICD-10-CM | POA: Diagnosis not present

## 2018-05-18 DIAGNOSIS — C719 Malignant neoplasm of brain, unspecified: Secondary | ICD-10-CM | POA: Diagnosis not present

## 2018-05-18 DIAGNOSIS — G311 Senile degeneration of brain, not elsewhere classified: Secondary | ICD-10-CM | POA: Diagnosis not present

## 2018-05-18 DIAGNOSIS — F0280 Dementia in other diseases classified elsewhere without behavioral disturbance: Secondary | ICD-10-CM | POA: Diagnosis not present

## 2018-05-18 DIAGNOSIS — R32 Unspecified urinary incontinence: Secondary | ICD-10-CM | POA: Diagnosis not present

## 2018-05-18 DIAGNOSIS — G301 Alzheimer's disease with late onset: Secondary | ICD-10-CM | POA: Diagnosis not present

## 2018-05-18 DIAGNOSIS — F0150 Vascular dementia without behavioral disturbance: Secondary | ICD-10-CM | POA: Diagnosis not present

## 2018-05-19 DIAGNOSIS — C719 Malignant neoplasm of brain, unspecified: Secondary | ICD-10-CM | POA: Diagnosis not present

## 2018-05-19 DIAGNOSIS — G311 Senile degeneration of brain, not elsewhere classified: Secondary | ICD-10-CM | POA: Diagnosis not present

## 2018-05-19 DIAGNOSIS — F0280 Dementia in other diseases classified elsewhere without behavioral disturbance: Secondary | ICD-10-CM | POA: Diagnosis not present

## 2018-05-19 DIAGNOSIS — I10 Essential (primary) hypertension: Secondary | ICD-10-CM | POA: Diagnosis not present

## 2018-05-19 DIAGNOSIS — R32 Unspecified urinary incontinence: Secondary | ICD-10-CM | POA: Diagnosis not present

## 2018-05-19 DIAGNOSIS — G4452 New daily persistent headache (NDPH): Secondary | ICD-10-CM | POA: Diagnosis not present

## 2018-05-22 DIAGNOSIS — F4323 Adjustment disorder with mixed anxiety and depressed mood: Secondary | ICD-10-CM | POA: Diagnosis not present

## 2018-05-25 DIAGNOSIS — R32 Unspecified urinary incontinence: Secondary | ICD-10-CM | POA: Diagnosis not present

## 2018-05-25 DIAGNOSIS — R531 Weakness: Secondary | ICD-10-CM | POA: Diagnosis not present

## 2018-05-25 DIAGNOSIS — E785 Hyperlipidemia, unspecified: Secondary | ICD-10-CM | POA: Diagnosis not present

## 2018-05-25 DIAGNOSIS — G4452 New daily persistent headache (NDPH): Secondary | ICD-10-CM | POA: Diagnosis not present

## 2018-05-25 DIAGNOSIS — C719 Malignant neoplasm of brain, unspecified: Secondary | ICD-10-CM | POA: Diagnosis not present

## 2018-05-25 DIAGNOSIS — I10 Essential (primary) hypertension: Secondary | ICD-10-CM | POA: Diagnosis not present

## 2018-05-25 DIAGNOSIS — F0390 Unspecified dementia without behavioral disturbance: Secondary | ICD-10-CM | POA: Diagnosis not present

## 2018-05-25 DIAGNOSIS — F0280 Dementia in other diseases classified elsewhere without behavioral disturbance: Secondary | ICD-10-CM | POA: Diagnosis not present

## 2018-05-25 DIAGNOSIS — G311 Senile degeneration of brain, not elsewhere classified: Secondary | ICD-10-CM | POA: Diagnosis not present

## 2018-05-29 DIAGNOSIS — R32 Unspecified urinary incontinence: Secondary | ICD-10-CM | POA: Diagnosis not present

## 2018-05-29 DIAGNOSIS — F0280 Dementia in other diseases classified elsewhere without behavioral disturbance: Secondary | ICD-10-CM | POA: Diagnosis not present

## 2018-05-29 DIAGNOSIS — G311 Senile degeneration of brain, not elsewhere classified: Secondary | ICD-10-CM | POA: Diagnosis not present

## 2018-05-29 DIAGNOSIS — G4452 New daily persistent headache (NDPH): Secondary | ICD-10-CM | POA: Diagnosis not present

## 2018-05-29 DIAGNOSIS — I10 Essential (primary) hypertension: Secondary | ICD-10-CM | POA: Diagnosis not present

## 2018-05-29 DIAGNOSIS — C719 Malignant neoplasm of brain, unspecified: Secondary | ICD-10-CM | POA: Diagnosis not present

## 2018-05-30 DIAGNOSIS — F4323 Adjustment disorder with mixed anxiety and depressed mood: Secondary | ICD-10-CM | POA: Diagnosis not present

## 2018-05-31 DIAGNOSIS — G4452 New daily persistent headache (NDPH): Secondary | ICD-10-CM | POA: Diagnosis not present

## 2018-05-31 DIAGNOSIS — I10 Essential (primary) hypertension: Secondary | ICD-10-CM | POA: Diagnosis not present

## 2018-05-31 DIAGNOSIS — G311 Senile degeneration of brain, not elsewhere classified: Secondary | ICD-10-CM | POA: Diagnosis not present

## 2018-05-31 DIAGNOSIS — F0280 Dementia in other diseases classified elsewhere without behavioral disturbance: Secondary | ICD-10-CM | POA: Diagnosis not present

## 2018-05-31 DIAGNOSIS — R32 Unspecified urinary incontinence: Secondary | ICD-10-CM | POA: Diagnosis not present

## 2018-05-31 DIAGNOSIS — C719 Malignant neoplasm of brain, unspecified: Secondary | ICD-10-CM | POA: Diagnosis not present

## 2018-06-01 DIAGNOSIS — G4452 New daily persistent headache (NDPH): Secondary | ICD-10-CM | POA: Diagnosis not present

## 2018-06-01 DIAGNOSIS — I10 Essential (primary) hypertension: Secondary | ICD-10-CM | POA: Diagnosis not present

## 2018-06-01 DIAGNOSIS — F419 Anxiety disorder, unspecified: Secondary | ICD-10-CM | POA: Diagnosis not present

## 2018-06-01 DIAGNOSIS — R32 Unspecified urinary incontinence: Secondary | ICD-10-CM | POA: Diagnosis not present

## 2018-06-01 DIAGNOSIS — F0280 Dementia in other diseases classified elsewhere without behavioral disturbance: Secondary | ICD-10-CM | POA: Diagnosis not present

## 2018-06-01 DIAGNOSIS — C719 Malignant neoplasm of brain, unspecified: Secondary | ICD-10-CM | POA: Diagnosis not present

## 2018-06-01 DIAGNOSIS — F0150 Vascular dementia without behavioral disturbance: Secondary | ICD-10-CM | POA: Diagnosis not present

## 2018-06-01 DIAGNOSIS — G311 Senile degeneration of brain, not elsewhere classified: Secondary | ICD-10-CM | POA: Diagnosis not present

## 2018-06-01 DIAGNOSIS — G301 Alzheimer's disease with late onset: Secondary | ICD-10-CM | POA: Diagnosis not present

## 2018-06-02 DIAGNOSIS — F0150 Vascular dementia without behavioral disturbance: Secondary | ICD-10-CM | POA: Diagnosis not present

## 2018-06-02 DIAGNOSIS — F419 Anxiety disorder, unspecified: Secondary | ICD-10-CM | POA: Diagnosis not present

## 2018-06-02 DIAGNOSIS — G301 Alzheimer's disease with late onset: Secondary | ICD-10-CM | POA: Diagnosis not present

## 2018-06-03 DIAGNOSIS — G311 Senile degeneration of brain, not elsewhere classified: Secondary | ICD-10-CM | POA: Diagnosis not present

## 2018-06-03 DIAGNOSIS — Z515 Encounter for palliative care: Secondary | ICD-10-CM | POA: Diagnosis not present

## 2018-06-03 DIAGNOSIS — K5900 Constipation, unspecified: Secondary | ICD-10-CM | POA: Diagnosis not present

## 2018-06-03 DIAGNOSIS — G4452 New daily persistent headache (NDPH): Secondary | ICD-10-CM | POA: Diagnosis not present

## 2018-06-03 DIAGNOSIS — I10 Essential (primary) hypertension: Secondary | ICD-10-CM | POA: Diagnosis not present

## 2018-06-03 DIAGNOSIS — R32 Unspecified urinary incontinence: Secondary | ICD-10-CM | POA: Diagnosis not present

## 2018-06-03 DIAGNOSIS — C719 Malignant neoplasm of brain, unspecified: Secondary | ICD-10-CM | POA: Diagnosis not present

## 2018-06-03 DIAGNOSIS — F0280 Dementia in other diseases classified elsewhere without behavioral disturbance: Secondary | ICD-10-CM | POA: Diagnosis not present

## 2018-06-07 DIAGNOSIS — D496 Neoplasm of unspecified behavior of brain: Secondary | ICD-10-CM | POA: Diagnosis not present

## 2018-06-07 DIAGNOSIS — I10 Essential (primary) hypertension: Secondary | ICD-10-CM | POA: Diagnosis not present

## 2018-06-07 DIAGNOSIS — F4323 Adjustment disorder with mixed anxiety and depressed mood: Secondary | ICD-10-CM | POA: Diagnosis not present

## 2018-06-07 DIAGNOSIS — R2689 Other abnormalities of gait and mobility: Secondary | ICD-10-CM | POA: Diagnosis not present

## 2018-06-07 DIAGNOSIS — R531 Weakness: Secondary | ICD-10-CM | POA: Diagnosis not present

## 2018-06-08 DIAGNOSIS — I10 Essential (primary) hypertension: Secondary | ICD-10-CM | POA: Diagnosis not present

## 2018-06-08 DIAGNOSIS — C719 Malignant neoplasm of brain, unspecified: Secondary | ICD-10-CM | POA: Diagnosis not present

## 2018-06-08 DIAGNOSIS — R32 Unspecified urinary incontinence: Secondary | ICD-10-CM | POA: Diagnosis not present

## 2018-06-08 DIAGNOSIS — G4452 New daily persistent headache (NDPH): Secondary | ICD-10-CM | POA: Diagnosis not present

## 2018-06-08 DIAGNOSIS — F0280 Dementia in other diseases classified elsewhere without behavioral disturbance: Secondary | ICD-10-CM | POA: Diagnosis not present

## 2018-06-08 DIAGNOSIS — G311 Senile degeneration of brain, not elsewhere classified: Secondary | ICD-10-CM | POA: Diagnosis not present

## 2018-06-12 DIAGNOSIS — I10 Essential (primary) hypertension: Secondary | ICD-10-CM | POA: Diagnosis not present

## 2018-06-12 DIAGNOSIS — C719 Malignant neoplasm of brain, unspecified: Secondary | ICD-10-CM | POA: Diagnosis not present

## 2018-06-12 DIAGNOSIS — G4452 New daily persistent headache (NDPH): Secondary | ICD-10-CM | POA: Diagnosis not present

## 2018-06-12 DIAGNOSIS — F0280 Dementia in other diseases classified elsewhere without behavioral disturbance: Secondary | ICD-10-CM | POA: Diagnosis not present

## 2018-06-12 DIAGNOSIS — G311 Senile degeneration of brain, not elsewhere classified: Secondary | ICD-10-CM | POA: Diagnosis not present

## 2018-06-12 DIAGNOSIS — R32 Unspecified urinary incontinence: Secondary | ICD-10-CM | POA: Diagnosis not present

## 2018-06-13 DIAGNOSIS — F4323 Adjustment disorder with mixed anxiety and depressed mood: Secondary | ICD-10-CM | POA: Diagnosis not present

## 2018-06-14 DIAGNOSIS — R32 Unspecified urinary incontinence: Secondary | ICD-10-CM | POA: Diagnosis not present

## 2018-06-14 DIAGNOSIS — F0280 Dementia in other diseases classified elsewhere without behavioral disturbance: Secondary | ICD-10-CM | POA: Diagnosis not present

## 2018-06-14 DIAGNOSIS — C719 Malignant neoplasm of brain, unspecified: Secondary | ICD-10-CM | POA: Diagnosis not present

## 2018-06-14 DIAGNOSIS — G311 Senile degeneration of brain, not elsewhere classified: Secondary | ICD-10-CM | POA: Diagnosis not present

## 2018-06-14 DIAGNOSIS — G4452 New daily persistent headache (NDPH): Secondary | ICD-10-CM | POA: Diagnosis not present

## 2018-06-14 DIAGNOSIS — I10 Essential (primary) hypertension: Secondary | ICD-10-CM | POA: Diagnosis not present

## 2018-06-15 DIAGNOSIS — G4452 New daily persistent headache (NDPH): Secondary | ICD-10-CM | POA: Diagnosis not present

## 2018-06-15 DIAGNOSIS — C719 Malignant neoplasm of brain, unspecified: Secondary | ICD-10-CM | POA: Diagnosis not present

## 2018-06-15 DIAGNOSIS — I10 Essential (primary) hypertension: Secondary | ICD-10-CM | POA: Diagnosis not present

## 2018-06-15 DIAGNOSIS — G311 Senile degeneration of brain, not elsewhere classified: Secondary | ICD-10-CM | POA: Diagnosis not present

## 2018-06-15 DIAGNOSIS — F0280 Dementia in other diseases classified elsewhere without behavioral disturbance: Secondary | ICD-10-CM | POA: Diagnosis not present

## 2018-06-15 DIAGNOSIS — R32 Unspecified urinary incontinence: Secondary | ICD-10-CM | POA: Diagnosis not present

## 2018-06-19 DIAGNOSIS — R32 Unspecified urinary incontinence: Secondary | ICD-10-CM | POA: Diagnosis not present

## 2018-06-19 DIAGNOSIS — I10 Essential (primary) hypertension: Secondary | ICD-10-CM | POA: Diagnosis not present

## 2018-06-19 DIAGNOSIS — F0280 Dementia in other diseases classified elsewhere without behavioral disturbance: Secondary | ICD-10-CM | POA: Diagnosis not present

## 2018-06-19 DIAGNOSIS — G4452 New daily persistent headache (NDPH): Secondary | ICD-10-CM | POA: Diagnosis not present

## 2018-06-19 DIAGNOSIS — C719 Malignant neoplasm of brain, unspecified: Secondary | ICD-10-CM | POA: Diagnosis not present

## 2018-06-19 DIAGNOSIS — G311 Senile degeneration of brain, not elsewhere classified: Secondary | ICD-10-CM | POA: Diagnosis not present

## 2018-06-21 DIAGNOSIS — F4323 Adjustment disorder with mixed anxiety and depressed mood: Secondary | ICD-10-CM | POA: Diagnosis not present

## 2018-06-22 DIAGNOSIS — D496 Neoplasm of unspecified behavior of brain: Secondary | ICD-10-CM | POA: Diagnosis not present

## 2018-06-22 DIAGNOSIS — F0280 Dementia in other diseases classified elsewhere without behavioral disturbance: Secondary | ICD-10-CM | POA: Diagnosis not present

## 2018-06-22 DIAGNOSIS — E785 Hyperlipidemia, unspecified: Secondary | ICD-10-CM | POA: Diagnosis not present

## 2018-06-22 DIAGNOSIS — G311 Senile degeneration of brain, not elsewhere classified: Secondary | ICD-10-CM | POA: Diagnosis not present

## 2018-06-22 DIAGNOSIS — I10 Essential (primary) hypertension: Secondary | ICD-10-CM | POA: Diagnosis not present

## 2018-06-22 DIAGNOSIS — R32 Unspecified urinary incontinence: Secondary | ICD-10-CM | POA: Diagnosis not present

## 2018-06-22 DIAGNOSIS — G4452 New daily persistent headache (NDPH): Secondary | ICD-10-CM | POA: Diagnosis not present

## 2018-06-22 DIAGNOSIS — F0390 Unspecified dementia without behavioral disturbance: Secondary | ICD-10-CM | POA: Diagnosis not present

## 2018-06-22 DIAGNOSIS — C719 Malignant neoplasm of brain, unspecified: Secondary | ICD-10-CM | POA: Diagnosis not present

## 2018-06-28 DIAGNOSIS — R32 Unspecified urinary incontinence: Secondary | ICD-10-CM | POA: Diagnosis not present

## 2018-06-28 DIAGNOSIS — C719 Malignant neoplasm of brain, unspecified: Secondary | ICD-10-CM | POA: Diagnosis not present

## 2018-06-28 DIAGNOSIS — I10 Essential (primary) hypertension: Secondary | ICD-10-CM | POA: Diagnosis not present

## 2018-06-28 DIAGNOSIS — F0280 Dementia in other diseases classified elsewhere without behavioral disturbance: Secondary | ICD-10-CM | POA: Diagnosis not present

## 2018-06-28 DIAGNOSIS — G311 Senile degeneration of brain, not elsewhere classified: Secondary | ICD-10-CM | POA: Diagnosis not present

## 2018-06-28 DIAGNOSIS — G4452 New daily persistent headache (NDPH): Secondary | ICD-10-CM | POA: Diagnosis not present

## 2018-06-29 DIAGNOSIS — F0150 Vascular dementia without behavioral disturbance: Secondary | ICD-10-CM | POA: Diagnosis not present

## 2018-06-29 DIAGNOSIS — I10 Essential (primary) hypertension: Secondary | ICD-10-CM | POA: Diagnosis not present

## 2018-06-29 DIAGNOSIS — F0280 Dementia in other diseases classified elsewhere without behavioral disturbance: Secondary | ICD-10-CM | POA: Diagnosis not present

## 2018-06-29 DIAGNOSIS — R32 Unspecified urinary incontinence: Secondary | ICD-10-CM | POA: Diagnosis not present

## 2018-06-29 DIAGNOSIS — G4452 New daily persistent headache (NDPH): Secondary | ICD-10-CM | POA: Diagnosis not present

## 2018-06-29 DIAGNOSIS — C719 Malignant neoplasm of brain, unspecified: Secondary | ICD-10-CM | POA: Diagnosis not present

## 2018-06-29 DIAGNOSIS — G301 Alzheimer's disease with late onset: Secondary | ICD-10-CM | POA: Diagnosis not present

## 2018-06-29 DIAGNOSIS — F419 Anxiety disorder, unspecified: Secondary | ICD-10-CM | POA: Diagnosis not present

## 2018-06-29 DIAGNOSIS — G311 Senile degeneration of brain, not elsewhere classified: Secondary | ICD-10-CM | POA: Diagnosis not present

## 2018-06-30 DIAGNOSIS — F0280 Dementia in other diseases classified elsewhere without behavioral disturbance: Secondary | ICD-10-CM | POA: Diagnosis not present

## 2018-06-30 DIAGNOSIS — G4452 New daily persistent headache (NDPH): Secondary | ICD-10-CM | POA: Diagnosis not present

## 2018-06-30 DIAGNOSIS — C719 Malignant neoplasm of brain, unspecified: Secondary | ICD-10-CM | POA: Diagnosis not present

## 2018-06-30 DIAGNOSIS — G311 Senile degeneration of brain, not elsewhere classified: Secondary | ICD-10-CM | POA: Diagnosis not present

## 2018-06-30 DIAGNOSIS — R32 Unspecified urinary incontinence: Secondary | ICD-10-CM | POA: Diagnosis not present

## 2018-06-30 DIAGNOSIS — I10 Essential (primary) hypertension: Secondary | ICD-10-CM | POA: Diagnosis not present

## 2018-07-01 DIAGNOSIS — F4323 Adjustment disorder with mixed anxiety and depressed mood: Secondary | ICD-10-CM | POA: Diagnosis not present

## 2018-07-02 DIAGNOSIS — C719 Malignant neoplasm of brain, unspecified: Secondary | ICD-10-CM | POA: Diagnosis not present

## 2018-07-02 DIAGNOSIS — R32 Unspecified urinary incontinence: Secondary | ICD-10-CM | POA: Diagnosis not present

## 2018-07-02 DIAGNOSIS — F0280 Dementia in other diseases classified elsewhere without behavioral disturbance: Secondary | ICD-10-CM | POA: Diagnosis not present

## 2018-07-02 DIAGNOSIS — G4452 New daily persistent headache (NDPH): Secondary | ICD-10-CM | POA: Diagnosis not present

## 2018-07-02 DIAGNOSIS — Z515 Encounter for palliative care: Secondary | ICD-10-CM | POA: Diagnosis not present

## 2018-07-02 DIAGNOSIS — I10 Essential (primary) hypertension: Secondary | ICD-10-CM | POA: Diagnosis not present

## 2018-07-02 DIAGNOSIS — K5900 Constipation, unspecified: Secondary | ICD-10-CM | POA: Diagnosis not present

## 2018-07-02 DIAGNOSIS — G311 Senile degeneration of brain, not elsewhere classified: Secondary | ICD-10-CM | POA: Diagnosis not present

## 2018-07-05 DIAGNOSIS — I10 Essential (primary) hypertension: Secondary | ICD-10-CM | POA: Diagnosis not present

## 2018-07-05 DIAGNOSIS — R32 Unspecified urinary incontinence: Secondary | ICD-10-CM | POA: Diagnosis not present

## 2018-07-05 DIAGNOSIS — G311 Senile degeneration of brain, not elsewhere classified: Secondary | ICD-10-CM | POA: Diagnosis not present

## 2018-07-05 DIAGNOSIS — F0280 Dementia in other diseases classified elsewhere without behavioral disturbance: Secondary | ICD-10-CM | POA: Diagnosis not present

## 2018-07-05 DIAGNOSIS — C719 Malignant neoplasm of brain, unspecified: Secondary | ICD-10-CM | POA: Diagnosis not present

## 2018-07-05 DIAGNOSIS — G4452 New daily persistent headache (NDPH): Secondary | ICD-10-CM | POA: Diagnosis not present

## 2018-07-06 DIAGNOSIS — F4323 Adjustment disorder with mixed anxiety and depressed mood: Secondary | ICD-10-CM | POA: Diagnosis not present

## 2018-07-10 DIAGNOSIS — F4323 Adjustment disorder with mixed anxiety and depressed mood: Secondary | ICD-10-CM | POA: Diagnosis not present

## 2018-07-12 DIAGNOSIS — G311 Senile degeneration of brain, not elsewhere classified: Secondary | ICD-10-CM | POA: Diagnosis not present

## 2018-07-12 DIAGNOSIS — C719 Malignant neoplasm of brain, unspecified: Secondary | ICD-10-CM | POA: Diagnosis not present

## 2018-07-12 DIAGNOSIS — F0280 Dementia in other diseases classified elsewhere without behavioral disturbance: Secondary | ICD-10-CM | POA: Diagnosis not present

## 2018-07-12 DIAGNOSIS — G4452 New daily persistent headache (NDPH): Secondary | ICD-10-CM | POA: Diagnosis not present

## 2018-07-12 DIAGNOSIS — R32 Unspecified urinary incontinence: Secondary | ICD-10-CM | POA: Diagnosis not present

## 2018-07-12 DIAGNOSIS — I10 Essential (primary) hypertension: Secondary | ICD-10-CM | POA: Diagnosis not present

## 2018-07-18 DIAGNOSIS — C719 Malignant neoplasm of brain, unspecified: Secondary | ICD-10-CM | POA: Diagnosis not present

## 2018-07-18 DIAGNOSIS — G4452 New daily persistent headache (NDPH): Secondary | ICD-10-CM | POA: Diagnosis not present

## 2018-07-18 DIAGNOSIS — G311 Senile degeneration of brain, not elsewhere classified: Secondary | ICD-10-CM | POA: Diagnosis not present

## 2018-07-18 DIAGNOSIS — R32 Unspecified urinary incontinence: Secondary | ICD-10-CM | POA: Diagnosis not present

## 2018-07-18 DIAGNOSIS — I10 Essential (primary) hypertension: Secondary | ICD-10-CM | POA: Diagnosis not present

## 2018-07-18 DIAGNOSIS — F0280 Dementia in other diseases classified elsewhere without behavioral disturbance: Secondary | ICD-10-CM | POA: Diagnosis not present

## 2018-07-19 DIAGNOSIS — F0280 Dementia in other diseases classified elsewhere without behavioral disturbance: Secondary | ICD-10-CM | POA: Diagnosis not present

## 2018-07-19 DIAGNOSIS — R32 Unspecified urinary incontinence: Secondary | ICD-10-CM | POA: Diagnosis not present

## 2018-07-19 DIAGNOSIS — G311 Senile degeneration of brain, not elsewhere classified: Secondary | ICD-10-CM | POA: Diagnosis not present

## 2018-07-19 DIAGNOSIS — I10 Essential (primary) hypertension: Secondary | ICD-10-CM | POA: Diagnosis not present

## 2018-07-19 DIAGNOSIS — C719 Malignant neoplasm of brain, unspecified: Secondary | ICD-10-CM | POA: Diagnosis not present

## 2018-07-19 DIAGNOSIS — G4452 New daily persistent headache (NDPH): Secondary | ICD-10-CM | POA: Diagnosis not present

## 2018-07-20 DIAGNOSIS — I10 Essential (primary) hypertension: Secondary | ICD-10-CM | POA: Diagnosis not present

## 2018-07-20 DIAGNOSIS — F0390 Unspecified dementia without behavioral disturbance: Secondary | ICD-10-CM | POA: Diagnosis not present

## 2018-07-20 DIAGNOSIS — D496 Neoplasm of unspecified behavior of brain: Secondary | ICD-10-CM | POA: Diagnosis not present

## 2018-07-20 DIAGNOSIS — E785 Hyperlipidemia, unspecified: Secondary | ICD-10-CM | POA: Diagnosis not present

## 2018-07-22 DIAGNOSIS — F4323 Adjustment disorder with mixed anxiety and depressed mood: Secondary | ICD-10-CM | POA: Diagnosis not present

## 2018-07-24 DIAGNOSIS — F0390 Unspecified dementia without behavioral disturbance: Secondary | ICD-10-CM | POA: Diagnosis not present

## 2018-07-24 DIAGNOSIS — E785 Hyperlipidemia, unspecified: Secondary | ICD-10-CM | POA: Diagnosis not present

## 2018-07-24 DIAGNOSIS — I10 Essential (primary) hypertension: Secondary | ICD-10-CM | POA: Diagnosis not present

## 2018-07-24 DIAGNOSIS — D496 Neoplasm of unspecified behavior of brain: Secondary | ICD-10-CM | POA: Diagnosis not present

## 2018-07-28 DIAGNOSIS — F4323 Adjustment disorder with mixed anxiety and depressed mood: Secondary | ICD-10-CM | POA: Diagnosis not present

## 2018-07-31 DIAGNOSIS — I10 Essential (primary) hypertension: Secondary | ICD-10-CM | POA: Diagnosis not present

## 2018-07-31 DIAGNOSIS — G4452 New daily persistent headache (NDPH): Secondary | ICD-10-CM | POA: Diagnosis not present

## 2018-07-31 DIAGNOSIS — G311 Senile degeneration of brain, not elsewhere classified: Secondary | ICD-10-CM | POA: Diagnosis not present

## 2018-07-31 DIAGNOSIS — R32 Unspecified urinary incontinence: Secondary | ICD-10-CM | POA: Diagnosis not present

## 2018-07-31 DIAGNOSIS — C719 Malignant neoplasm of brain, unspecified: Secondary | ICD-10-CM | POA: Diagnosis not present

## 2018-07-31 DIAGNOSIS — F0280 Dementia in other diseases classified elsewhere without behavioral disturbance: Secondary | ICD-10-CM | POA: Diagnosis not present

## 2018-08-01 DIAGNOSIS — G301 Alzheimer's disease with late onset: Secondary | ICD-10-CM | POA: Diagnosis not present

## 2018-08-01 DIAGNOSIS — F419 Anxiety disorder, unspecified: Secondary | ICD-10-CM | POA: Diagnosis not present

## 2018-08-01 DIAGNOSIS — F0150 Vascular dementia without behavioral disturbance: Secondary | ICD-10-CM | POA: Diagnosis not present

## 2018-08-02 DIAGNOSIS — I10 Essential (primary) hypertension: Secondary | ICD-10-CM | POA: Diagnosis not present

## 2018-08-02 DIAGNOSIS — G4452 New daily persistent headache (NDPH): Secondary | ICD-10-CM | POA: Diagnosis not present

## 2018-08-02 DIAGNOSIS — Z515 Encounter for palliative care: Secondary | ICD-10-CM | POA: Diagnosis not present

## 2018-08-02 DIAGNOSIS — R32 Unspecified urinary incontinence: Secondary | ICD-10-CM | POA: Diagnosis not present

## 2018-08-02 DIAGNOSIS — F0280 Dementia in other diseases classified elsewhere without behavioral disturbance: Secondary | ICD-10-CM | POA: Diagnosis not present

## 2018-08-02 DIAGNOSIS — K5900 Constipation, unspecified: Secondary | ICD-10-CM | POA: Diagnosis not present

## 2018-08-02 DIAGNOSIS — G311 Senile degeneration of brain, not elsewhere classified: Secondary | ICD-10-CM | POA: Diagnosis not present

## 2018-08-02 DIAGNOSIS — C719 Malignant neoplasm of brain, unspecified: Secondary | ICD-10-CM | POA: Diagnosis not present

## 2018-08-06 DIAGNOSIS — F4323 Adjustment disorder with mixed anxiety and depressed mood: Secondary | ICD-10-CM | POA: Diagnosis not present

## 2018-08-15 DIAGNOSIS — C719 Malignant neoplasm of brain, unspecified: Secondary | ICD-10-CM | POA: Diagnosis not present

## 2018-08-15 DIAGNOSIS — F0280 Dementia in other diseases classified elsewhere without behavioral disturbance: Secondary | ICD-10-CM | POA: Diagnosis not present

## 2018-08-15 DIAGNOSIS — R32 Unspecified urinary incontinence: Secondary | ICD-10-CM | POA: Diagnosis not present

## 2018-08-15 DIAGNOSIS — G311 Senile degeneration of brain, not elsewhere classified: Secondary | ICD-10-CM | POA: Diagnosis not present

## 2018-08-15 DIAGNOSIS — I10 Essential (primary) hypertension: Secondary | ICD-10-CM | POA: Diagnosis not present

## 2018-08-15 DIAGNOSIS — G4452 New daily persistent headache (NDPH): Secondary | ICD-10-CM | POA: Diagnosis not present

## 2018-08-17 DIAGNOSIS — I10 Essential (primary) hypertension: Secondary | ICD-10-CM | POA: Diagnosis not present

## 2018-08-17 DIAGNOSIS — F0390 Unspecified dementia without behavioral disturbance: Secondary | ICD-10-CM | POA: Diagnosis not present

## 2018-08-17 DIAGNOSIS — G301 Alzheimer's disease with late onset: Secondary | ICD-10-CM | POA: Diagnosis not present

## 2018-08-17 DIAGNOSIS — F419 Anxiety disorder, unspecified: Secondary | ICD-10-CM | POA: Diagnosis not present

## 2018-08-17 DIAGNOSIS — F4323 Adjustment disorder with mixed anxiety and depressed mood: Secondary | ICD-10-CM | POA: Diagnosis not present

## 2018-08-17 DIAGNOSIS — F0150 Vascular dementia without behavioral disturbance: Secondary | ICD-10-CM | POA: Diagnosis not present

## 2018-08-17 DIAGNOSIS — D496 Neoplasm of unspecified behavior of brain: Secondary | ICD-10-CM | POA: Diagnosis not present

## 2018-08-17 DIAGNOSIS — E785 Hyperlipidemia, unspecified: Secondary | ICD-10-CM | POA: Diagnosis not present

## 2018-08-24 DIAGNOSIS — G4452 New daily persistent headache (NDPH): Secondary | ICD-10-CM | POA: Diagnosis not present

## 2018-08-24 DIAGNOSIS — R32 Unspecified urinary incontinence: Secondary | ICD-10-CM | POA: Diagnosis not present

## 2018-08-24 DIAGNOSIS — C719 Malignant neoplasm of brain, unspecified: Secondary | ICD-10-CM | POA: Diagnosis not present

## 2018-08-24 DIAGNOSIS — I10 Essential (primary) hypertension: Secondary | ICD-10-CM | POA: Diagnosis not present

## 2018-08-24 DIAGNOSIS — G311 Senile degeneration of brain, not elsewhere classified: Secondary | ICD-10-CM | POA: Diagnosis not present

## 2018-08-24 DIAGNOSIS — F0280 Dementia in other diseases classified elsewhere without behavioral disturbance: Secondary | ICD-10-CM | POA: Diagnosis not present

## 2018-08-25 DIAGNOSIS — G894 Chronic pain syndrome: Secondary | ICD-10-CM | POA: Diagnosis not present

## 2018-08-25 DIAGNOSIS — D496 Neoplasm of unspecified behavior of brain: Secondary | ICD-10-CM | POA: Diagnosis not present

## 2018-08-25 DIAGNOSIS — I10 Essential (primary) hypertension: Secondary | ICD-10-CM | POA: Diagnosis not present

## 2018-08-25 DIAGNOSIS — E785 Hyperlipidemia, unspecified: Secondary | ICD-10-CM | POA: Diagnosis not present

## 2018-08-26 DIAGNOSIS — F4323 Adjustment disorder with mixed anxiety and depressed mood: Secondary | ICD-10-CM | POA: Diagnosis not present

## 2018-08-29 DIAGNOSIS — C719 Malignant neoplasm of brain, unspecified: Secondary | ICD-10-CM | POA: Diagnosis not present

## 2018-08-29 DIAGNOSIS — G4452 New daily persistent headache (NDPH): Secondary | ICD-10-CM | POA: Diagnosis not present

## 2018-08-29 DIAGNOSIS — R32 Unspecified urinary incontinence: Secondary | ICD-10-CM | POA: Diagnosis not present

## 2018-08-29 DIAGNOSIS — F0280 Dementia in other diseases classified elsewhere without behavioral disturbance: Secondary | ICD-10-CM | POA: Diagnosis not present

## 2018-08-29 DIAGNOSIS — G311 Senile degeneration of brain, not elsewhere classified: Secondary | ICD-10-CM | POA: Diagnosis not present

## 2018-08-29 DIAGNOSIS — I10 Essential (primary) hypertension: Secondary | ICD-10-CM | POA: Diagnosis not present

## 2018-08-31 DIAGNOSIS — G301 Alzheimer's disease with late onset: Secondary | ICD-10-CM | POA: Diagnosis not present

## 2018-08-31 DIAGNOSIS — I10 Essential (primary) hypertension: Secondary | ICD-10-CM | POA: Diagnosis not present

## 2018-08-31 DIAGNOSIS — F4323 Adjustment disorder with mixed anxiety and depressed mood: Secondary | ICD-10-CM | POA: Diagnosis not present

## 2018-08-31 DIAGNOSIS — F0280 Dementia in other diseases classified elsewhere without behavioral disturbance: Secondary | ICD-10-CM | POA: Diagnosis not present

## 2018-08-31 DIAGNOSIS — C719 Malignant neoplasm of brain, unspecified: Secondary | ICD-10-CM | POA: Diagnosis not present

## 2018-08-31 DIAGNOSIS — F0150 Vascular dementia without behavioral disturbance: Secondary | ICD-10-CM | POA: Diagnosis not present

## 2018-08-31 DIAGNOSIS — G311 Senile degeneration of brain, not elsewhere classified: Secondary | ICD-10-CM | POA: Diagnosis not present

## 2018-08-31 DIAGNOSIS — R32 Unspecified urinary incontinence: Secondary | ICD-10-CM | POA: Diagnosis not present

## 2018-08-31 DIAGNOSIS — F419 Anxiety disorder, unspecified: Secondary | ICD-10-CM | POA: Diagnosis not present

## 2018-08-31 DIAGNOSIS — G4452 New daily persistent headache (NDPH): Secondary | ICD-10-CM | POA: Diagnosis not present

## 2018-09-01 DIAGNOSIS — G4452 New daily persistent headache (NDPH): Secondary | ICD-10-CM | POA: Diagnosis not present

## 2018-09-01 DIAGNOSIS — R32 Unspecified urinary incontinence: Secondary | ICD-10-CM | POA: Diagnosis not present

## 2018-09-01 DIAGNOSIS — C719 Malignant neoplasm of brain, unspecified: Secondary | ICD-10-CM | POA: Diagnosis not present

## 2018-09-01 DIAGNOSIS — I10 Essential (primary) hypertension: Secondary | ICD-10-CM | POA: Diagnosis not present

## 2018-09-01 DIAGNOSIS — Z515 Encounter for palliative care: Secondary | ICD-10-CM | POA: Diagnosis not present

## 2018-09-01 DIAGNOSIS — F0280 Dementia in other diseases classified elsewhere without behavioral disturbance: Secondary | ICD-10-CM | POA: Diagnosis not present

## 2018-09-01 DIAGNOSIS — K5900 Constipation, unspecified: Secondary | ICD-10-CM | POA: Diagnosis not present

## 2018-09-01 DIAGNOSIS — G311 Senile degeneration of brain, not elsewhere classified: Secondary | ICD-10-CM | POA: Diagnosis not present

## 2018-09-05 DIAGNOSIS — G4452 New daily persistent headache (NDPH): Secondary | ICD-10-CM | POA: Diagnosis not present

## 2018-09-05 DIAGNOSIS — G311 Senile degeneration of brain, not elsewhere classified: Secondary | ICD-10-CM | POA: Diagnosis not present

## 2018-09-05 DIAGNOSIS — C719 Malignant neoplasm of brain, unspecified: Secondary | ICD-10-CM | POA: Diagnosis not present

## 2018-09-05 DIAGNOSIS — R32 Unspecified urinary incontinence: Secondary | ICD-10-CM | POA: Diagnosis not present

## 2018-09-05 DIAGNOSIS — F0280 Dementia in other diseases classified elsewhere without behavioral disturbance: Secondary | ICD-10-CM | POA: Diagnosis not present

## 2018-09-05 DIAGNOSIS — I10 Essential (primary) hypertension: Secondary | ICD-10-CM | POA: Diagnosis not present

## 2018-09-07 DIAGNOSIS — F4323 Adjustment disorder with mixed anxiety and depressed mood: Secondary | ICD-10-CM | POA: Diagnosis not present

## 2018-09-11 DIAGNOSIS — R32 Unspecified urinary incontinence: Secondary | ICD-10-CM | POA: Diagnosis not present

## 2018-09-11 DIAGNOSIS — C719 Malignant neoplasm of brain, unspecified: Secondary | ICD-10-CM | POA: Diagnosis not present

## 2018-09-11 DIAGNOSIS — F0280 Dementia in other diseases classified elsewhere without behavioral disturbance: Secondary | ICD-10-CM | POA: Diagnosis not present

## 2018-09-11 DIAGNOSIS — G311 Senile degeneration of brain, not elsewhere classified: Secondary | ICD-10-CM | POA: Diagnosis not present

## 2018-09-11 DIAGNOSIS — G4452 New daily persistent headache (NDPH): Secondary | ICD-10-CM | POA: Diagnosis not present

## 2018-09-11 DIAGNOSIS — I10 Essential (primary) hypertension: Secondary | ICD-10-CM | POA: Diagnosis not present

## 2018-09-14 DIAGNOSIS — D496 Neoplasm of unspecified behavior of brain: Secondary | ICD-10-CM | POA: Diagnosis not present

## 2018-09-14 DIAGNOSIS — G301 Alzheimer's disease with late onset: Secondary | ICD-10-CM | POA: Diagnosis not present

## 2018-09-14 DIAGNOSIS — F419 Anxiety disorder, unspecified: Secondary | ICD-10-CM | POA: Diagnosis not present

## 2018-09-14 DIAGNOSIS — R2689 Other abnormalities of gait and mobility: Secondary | ICD-10-CM | POA: Diagnosis not present

## 2018-09-14 DIAGNOSIS — F4323 Adjustment disorder with mixed anxiety and depressed mood: Secondary | ICD-10-CM | POA: Diagnosis not present

## 2018-09-14 DIAGNOSIS — I10 Essential (primary) hypertension: Secondary | ICD-10-CM | POA: Diagnosis not present

## 2018-09-14 DIAGNOSIS — R531 Weakness: Secondary | ICD-10-CM | POA: Diagnosis not present

## 2018-09-14 DIAGNOSIS — F0150 Vascular dementia without behavioral disturbance: Secondary | ICD-10-CM | POA: Diagnosis not present

## 2018-09-15 DIAGNOSIS — C719 Malignant neoplasm of brain, unspecified: Secondary | ICD-10-CM | POA: Diagnosis not present

## 2018-09-15 DIAGNOSIS — F0280 Dementia in other diseases classified elsewhere without behavioral disturbance: Secondary | ICD-10-CM | POA: Diagnosis not present

## 2018-09-15 DIAGNOSIS — G311 Senile degeneration of brain, not elsewhere classified: Secondary | ICD-10-CM | POA: Diagnosis not present

## 2018-09-15 DIAGNOSIS — I10 Essential (primary) hypertension: Secondary | ICD-10-CM | POA: Diagnosis not present

## 2018-09-15 DIAGNOSIS — R32 Unspecified urinary incontinence: Secondary | ICD-10-CM | POA: Diagnosis not present

## 2018-09-15 DIAGNOSIS — G4452 New daily persistent headache (NDPH): Secondary | ICD-10-CM | POA: Diagnosis not present

## 2018-09-21 DIAGNOSIS — G4452 New daily persistent headache (NDPH): Secondary | ICD-10-CM | POA: Diagnosis not present

## 2018-09-21 DIAGNOSIS — R32 Unspecified urinary incontinence: Secondary | ICD-10-CM | POA: Diagnosis not present

## 2018-09-21 DIAGNOSIS — I10 Essential (primary) hypertension: Secondary | ICD-10-CM | POA: Diagnosis not present

## 2018-09-21 DIAGNOSIS — G311 Senile degeneration of brain, not elsewhere classified: Secondary | ICD-10-CM | POA: Diagnosis not present

## 2018-09-21 DIAGNOSIS — C719 Malignant neoplasm of brain, unspecified: Secondary | ICD-10-CM | POA: Diagnosis not present

## 2018-09-21 DIAGNOSIS — F0280 Dementia in other diseases classified elsewhere without behavioral disturbance: Secondary | ICD-10-CM | POA: Diagnosis not present

## 2018-09-23 DIAGNOSIS — F4323 Adjustment disorder with mixed anxiety and depressed mood: Secondary | ICD-10-CM | POA: Diagnosis not present

## 2018-09-27 DIAGNOSIS — C719 Malignant neoplasm of brain, unspecified: Secondary | ICD-10-CM | POA: Diagnosis not present

## 2018-09-27 DIAGNOSIS — D496 Neoplasm of unspecified behavior of brain: Secondary | ICD-10-CM | POA: Diagnosis not present

## 2018-09-27 DIAGNOSIS — G894 Chronic pain syndrome: Secondary | ICD-10-CM | POA: Diagnosis not present

## 2018-09-27 DIAGNOSIS — E785 Hyperlipidemia, unspecified: Secondary | ICD-10-CM | POA: Diagnosis not present

## 2018-09-27 DIAGNOSIS — I10 Essential (primary) hypertension: Secondary | ICD-10-CM | POA: Diagnosis not present

## 2018-09-27 DIAGNOSIS — R32 Unspecified urinary incontinence: Secondary | ICD-10-CM | POA: Diagnosis not present

## 2018-09-27 DIAGNOSIS — G4452 New daily persistent headache (NDPH): Secondary | ICD-10-CM | POA: Diagnosis not present

## 2018-09-27 DIAGNOSIS — F0280 Dementia in other diseases classified elsewhere without behavioral disturbance: Secondary | ICD-10-CM | POA: Diagnosis not present

## 2018-09-27 DIAGNOSIS — G311 Senile degeneration of brain, not elsewhere classified: Secondary | ICD-10-CM | POA: Diagnosis not present

## 2018-09-28 DIAGNOSIS — F0280 Dementia in other diseases classified elsewhere without behavioral disturbance: Secondary | ICD-10-CM | POA: Diagnosis not present

## 2018-09-28 DIAGNOSIS — G311 Senile degeneration of brain, not elsewhere classified: Secondary | ICD-10-CM | POA: Diagnosis not present

## 2018-09-28 DIAGNOSIS — C719 Malignant neoplasm of brain, unspecified: Secondary | ICD-10-CM | POA: Diagnosis not present

## 2018-09-28 DIAGNOSIS — I10 Essential (primary) hypertension: Secondary | ICD-10-CM | POA: Diagnosis not present

## 2018-09-28 DIAGNOSIS — R32 Unspecified urinary incontinence: Secondary | ICD-10-CM | POA: Diagnosis not present

## 2018-09-28 DIAGNOSIS — G4452 New daily persistent headache (NDPH): Secondary | ICD-10-CM | POA: Diagnosis not present

## 2018-09-29 DIAGNOSIS — I10 Essential (primary) hypertension: Secondary | ICD-10-CM | POA: Diagnosis not present

## 2018-09-29 DIAGNOSIS — C719 Malignant neoplasm of brain, unspecified: Secondary | ICD-10-CM | POA: Diagnosis not present

## 2018-09-29 DIAGNOSIS — F0280 Dementia in other diseases classified elsewhere without behavioral disturbance: Secondary | ICD-10-CM | POA: Diagnosis not present

## 2018-09-29 DIAGNOSIS — R32 Unspecified urinary incontinence: Secondary | ICD-10-CM | POA: Diagnosis not present

## 2018-09-29 DIAGNOSIS — G311 Senile degeneration of brain, not elsewhere classified: Secondary | ICD-10-CM | POA: Diagnosis not present

## 2018-09-29 DIAGNOSIS — G4452 New daily persistent headache (NDPH): Secondary | ICD-10-CM | POA: Diagnosis not present

## 2018-09-30 DIAGNOSIS — F4323 Adjustment disorder with mixed anxiety and depressed mood: Secondary | ICD-10-CM | POA: Diagnosis not present

## 2018-10-02 DIAGNOSIS — G4452 New daily persistent headache (NDPH): Secondary | ICD-10-CM | POA: Diagnosis not present

## 2018-10-02 DIAGNOSIS — R32 Unspecified urinary incontinence: Secondary | ICD-10-CM | POA: Diagnosis not present

## 2018-10-02 DIAGNOSIS — I10 Essential (primary) hypertension: Secondary | ICD-10-CM | POA: Diagnosis not present

## 2018-10-02 DIAGNOSIS — K5900 Constipation, unspecified: Secondary | ICD-10-CM | POA: Diagnosis not present

## 2018-10-02 DIAGNOSIS — F0280 Dementia in other diseases classified elsewhere without behavioral disturbance: Secondary | ICD-10-CM | POA: Diagnosis not present

## 2018-10-02 DIAGNOSIS — C719 Malignant neoplasm of brain, unspecified: Secondary | ICD-10-CM | POA: Diagnosis not present

## 2018-10-02 DIAGNOSIS — G311 Senile degeneration of brain, not elsewhere classified: Secondary | ICD-10-CM | POA: Diagnosis not present

## 2018-10-02 DIAGNOSIS — Z515 Encounter for palliative care: Secondary | ICD-10-CM | POA: Diagnosis not present

## 2018-10-03 DIAGNOSIS — G4452 New daily persistent headache (NDPH): Secondary | ICD-10-CM | POA: Diagnosis not present

## 2018-10-03 DIAGNOSIS — R32 Unspecified urinary incontinence: Secondary | ICD-10-CM | POA: Diagnosis not present

## 2018-10-03 DIAGNOSIS — C719 Malignant neoplasm of brain, unspecified: Secondary | ICD-10-CM | POA: Diagnosis not present

## 2018-10-03 DIAGNOSIS — F0280 Dementia in other diseases classified elsewhere without behavioral disturbance: Secondary | ICD-10-CM | POA: Diagnosis not present

## 2018-10-03 DIAGNOSIS — I10 Essential (primary) hypertension: Secondary | ICD-10-CM | POA: Diagnosis not present

## 2018-10-03 DIAGNOSIS — G311 Senile degeneration of brain, not elsewhere classified: Secondary | ICD-10-CM | POA: Diagnosis not present

## 2018-10-05 DIAGNOSIS — F4323 Adjustment disorder with mixed anxiety and depressed mood: Secondary | ICD-10-CM | POA: Diagnosis not present

## 2018-10-07 DIAGNOSIS — F4323 Adjustment disorder with mixed anxiety and depressed mood: Secondary | ICD-10-CM | POA: Diagnosis not present

## 2018-10-10 DIAGNOSIS — F0280 Dementia in other diseases classified elsewhere without behavioral disturbance: Secondary | ICD-10-CM | POA: Diagnosis not present

## 2018-10-10 DIAGNOSIS — I10 Essential (primary) hypertension: Secondary | ICD-10-CM | POA: Diagnosis not present

## 2018-10-10 DIAGNOSIS — R32 Unspecified urinary incontinence: Secondary | ICD-10-CM | POA: Diagnosis not present

## 2018-10-10 DIAGNOSIS — C719 Malignant neoplasm of brain, unspecified: Secondary | ICD-10-CM | POA: Diagnosis not present

## 2018-10-10 DIAGNOSIS — G311 Senile degeneration of brain, not elsewhere classified: Secondary | ICD-10-CM | POA: Diagnosis not present

## 2018-10-10 DIAGNOSIS — G4452 New daily persistent headache (NDPH): Secondary | ICD-10-CM | POA: Diagnosis not present

## 2018-10-12 DIAGNOSIS — I10 Essential (primary) hypertension: Secondary | ICD-10-CM | POA: Diagnosis not present

## 2018-10-12 DIAGNOSIS — E785 Hyperlipidemia, unspecified: Secondary | ICD-10-CM | POA: Diagnosis not present

## 2018-10-12 DIAGNOSIS — D496 Neoplasm of unspecified behavior of brain: Secondary | ICD-10-CM | POA: Diagnosis not present

## 2018-10-12 DIAGNOSIS — F0150 Vascular dementia without behavioral disturbance: Secondary | ICD-10-CM | POA: Diagnosis not present

## 2018-10-12 DIAGNOSIS — G301 Alzheimer's disease with late onset: Secondary | ICD-10-CM | POA: Diagnosis not present

## 2018-10-12 DIAGNOSIS — F419 Anxiety disorder, unspecified: Secondary | ICD-10-CM | POA: Diagnosis not present

## 2018-10-12 DIAGNOSIS — G894 Chronic pain syndrome: Secondary | ICD-10-CM | POA: Diagnosis not present

## 2018-10-14 DIAGNOSIS — F4323 Adjustment disorder with mixed anxiety and depressed mood: Secondary | ICD-10-CM | POA: Diagnosis not present

## 2018-10-18 DIAGNOSIS — G4452 New daily persistent headache (NDPH): Secondary | ICD-10-CM | POA: Diagnosis not present

## 2018-10-18 DIAGNOSIS — I10 Essential (primary) hypertension: Secondary | ICD-10-CM | POA: Diagnosis not present

## 2018-10-18 DIAGNOSIS — R32 Unspecified urinary incontinence: Secondary | ICD-10-CM | POA: Diagnosis not present

## 2018-10-18 DIAGNOSIS — F0280 Dementia in other diseases classified elsewhere without behavioral disturbance: Secondary | ICD-10-CM | POA: Diagnosis not present

## 2018-10-18 DIAGNOSIS — G311 Senile degeneration of brain, not elsewhere classified: Secondary | ICD-10-CM | POA: Diagnosis not present

## 2018-10-18 DIAGNOSIS — C719 Malignant neoplasm of brain, unspecified: Secondary | ICD-10-CM | POA: Diagnosis not present

## 2018-10-20 DIAGNOSIS — G4452 New daily persistent headache (NDPH): Secondary | ICD-10-CM | POA: Diagnosis not present

## 2018-10-20 DIAGNOSIS — R32 Unspecified urinary incontinence: Secondary | ICD-10-CM | POA: Diagnosis not present

## 2018-10-20 DIAGNOSIS — F0280 Dementia in other diseases classified elsewhere without behavioral disturbance: Secondary | ICD-10-CM | POA: Diagnosis not present

## 2018-10-20 DIAGNOSIS — C719 Malignant neoplasm of brain, unspecified: Secondary | ICD-10-CM | POA: Diagnosis not present

## 2018-10-20 DIAGNOSIS — G311 Senile degeneration of brain, not elsewhere classified: Secondary | ICD-10-CM | POA: Diagnosis not present

## 2018-10-20 DIAGNOSIS — I10 Essential (primary) hypertension: Secondary | ICD-10-CM | POA: Diagnosis not present

## 2018-10-24 DIAGNOSIS — R32 Unspecified urinary incontinence: Secondary | ICD-10-CM | POA: Diagnosis not present

## 2018-10-24 DIAGNOSIS — G4452 New daily persistent headache (NDPH): Secondary | ICD-10-CM | POA: Diagnosis not present

## 2018-10-24 DIAGNOSIS — F0280 Dementia in other diseases classified elsewhere without behavioral disturbance: Secondary | ICD-10-CM | POA: Diagnosis not present

## 2018-10-24 DIAGNOSIS — G311 Senile degeneration of brain, not elsewhere classified: Secondary | ICD-10-CM | POA: Diagnosis not present

## 2018-10-24 DIAGNOSIS — I10 Essential (primary) hypertension: Secondary | ICD-10-CM | POA: Diagnosis not present

## 2018-10-24 DIAGNOSIS — C719 Malignant neoplasm of brain, unspecified: Secondary | ICD-10-CM | POA: Diagnosis not present

## 2018-10-27 DIAGNOSIS — F4323 Adjustment disorder with mixed anxiety and depressed mood: Secondary | ICD-10-CM | POA: Diagnosis not present

## 2018-10-27 DIAGNOSIS — C719 Malignant neoplasm of brain, unspecified: Secondary | ICD-10-CM | POA: Diagnosis not present

## 2018-10-27 DIAGNOSIS — G894 Chronic pain syndrome: Secondary | ICD-10-CM | POA: Diagnosis not present

## 2018-10-27 DIAGNOSIS — R51 Headache: Secondary | ICD-10-CM | POA: Diagnosis not present

## 2018-10-27 DIAGNOSIS — I10 Essential (primary) hypertension: Secondary | ICD-10-CM | POA: Diagnosis not present

## 2018-11-01 DIAGNOSIS — C719 Malignant neoplasm of brain, unspecified: Secondary | ICD-10-CM | POA: Diagnosis not present

## 2018-11-01 DIAGNOSIS — K5900 Constipation, unspecified: Secondary | ICD-10-CM | POA: Diagnosis not present

## 2018-11-01 DIAGNOSIS — I10 Essential (primary) hypertension: Secondary | ICD-10-CM | POA: Diagnosis not present

## 2018-11-01 DIAGNOSIS — G311 Senile degeneration of brain, not elsewhere classified: Secondary | ICD-10-CM | POA: Diagnosis not present

## 2018-11-01 DIAGNOSIS — F0280 Dementia in other diseases classified elsewhere without behavioral disturbance: Secondary | ICD-10-CM | POA: Diagnosis not present

## 2018-11-01 DIAGNOSIS — R32 Unspecified urinary incontinence: Secondary | ICD-10-CM | POA: Diagnosis not present

## 2018-11-01 DIAGNOSIS — Z515 Encounter for palliative care: Secondary | ICD-10-CM | POA: Diagnosis not present

## 2018-11-01 DIAGNOSIS — G4452 New daily persistent headache (NDPH): Secondary | ICD-10-CM | POA: Diagnosis not present

## 2018-11-03 DIAGNOSIS — F4323 Adjustment disorder with mixed anxiety and depressed mood: Secondary | ICD-10-CM | POA: Diagnosis not present

## 2018-11-08 DIAGNOSIS — F4323 Adjustment disorder with mixed anxiety and depressed mood: Secondary | ICD-10-CM | POA: Diagnosis not present

## 2018-11-09 DIAGNOSIS — G894 Chronic pain syndrome: Secondary | ICD-10-CM | POA: Diagnosis not present

## 2018-11-09 DIAGNOSIS — R51 Headache: Secondary | ICD-10-CM | POA: Diagnosis not present

## 2018-11-09 DIAGNOSIS — C719 Malignant neoplasm of brain, unspecified: Secondary | ICD-10-CM | POA: Diagnosis not present

## 2018-11-09 DIAGNOSIS — I10 Essential (primary) hypertension: Secondary | ICD-10-CM | POA: Diagnosis not present

## 2018-11-10 DIAGNOSIS — G311 Senile degeneration of brain, not elsewhere classified: Secondary | ICD-10-CM | POA: Diagnosis not present

## 2018-11-10 DIAGNOSIS — F0280 Dementia in other diseases classified elsewhere without behavioral disturbance: Secondary | ICD-10-CM | POA: Diagnosis not present

## 2018-11-10 DIAGNOSIS — R32 Unspecified urinary incontinence: Secondary | ICD-10-CM | POA: Diagnosis not present

## 2018-11-10 DIAGNOSIS — I10 Essential (primary) hypertension: Secondary | ICD-10-CM | POA: Diagnosis not present

## 2018-11-10 DIAGNOSIS — G4452 New daily persistent headache (NDPH): Secondary | ICD-10-CM | POA: Diagnosis not present

## 2018-11-10 DIAGNOSIS — C719 Malignant neoplasm of brain, unspecified: Secondary | ICD-10-CM | POA: Diagnosis not present

## 2018-11-14 DIAGNOSIS — I10 Essential (primary) hypertension: Secondary | ICD-10-CM | POA: Diagnosis not present

## 2018-11-14 DIAGNOSIS — G4452 New daily persistent headache (NDPH): Secondary | ICD-10-CM | POA: Diagnosis not present

## 2018-11-14 DIAGNOSIS — F0280 Dementia in other diseases classified elsewhere without behavioral disturbance: Secondary | ICD-10-CM | POA: Diagnosis not present

## 2018-11-14 DIAGNOSIS — C719 Malignant neoplasm of brain, unspecified: Secondary | ICD-10-CM | POA: Diagnosis not present

## 2018-11-14 DIAGNOSIS — R32 Unspecified urinary incontinence: Secondary | ICD-10-CM | POA: Diagnosis not present

## 2018-11-14 DIAGNOSIS — G311 Senile degeneration of brain, not elsewhere classified: Secondary | ICD-10-CM | POA: Diagnosis not present

## 2018-11-15 DIAGNOSIS — F064 Anxiety disorder due to known physiological condition: Secondary | ICD-10-CM | POA: Diagnosis not present

## 2018-11-15 DIAGNOSIS — G301 Alzheimer's disease with late onset: Secondary | ICD-10-CM | POA: Diagnosis not present

## 2018-11-17 DIAGNOSIS — F4323 Adjustment disorder with mixed anxiety and depressed mood: Secondary | ICD-10-CM | POA: Diagnosis not present

## 2018-11-22 DIAGNOSIS — F4323 Adjustment disorder with mixed anxiety and depressed mood: Secondary | ICD-10-CM | POA: Diagnosis not present

## 2018-11-24 DIAGNOSIS — F0280 Dementia in other diseases classified elsewhere without behavioral disturbance: Secondary | ICD-10-CM | POA: Diagnosis not present

## 2018-11-24 DIAGNOSIS — R32 Unspecified urinary incontinence: Secondary | ICD-10-CM | POA: Diagnosis not present

## 2018-11-24 DIAGNOSIS — G4452 New daily persistent headache (NDPH): Secondary | ICD-10-CM | POA: Diagnosis not present

## 2018-11-24 DIAGNOSIS — G311 Senile degeneration of brain, not elsewhere classified: Secondary | ICD-10-CM | POA: Diagnosis not present

## 2018-11-24 DIAGNOSIS — G894 Chronic pain syndrome: Secondary | ICD-10-CM | POA: Diagnosis not present

## 2018-11-24 DIAGNOSIS — R51 Headache: Secondary | ICD-10-CM | POA: Diagnosis not present

## 2018-11-24 DIAGNOSIS — C719 Malignant neoplasm of brain, unspecified: Secondary | ICD-10-CM | POA: Diagnosis not present

## 2018-11-24 DIAGNOSIS — I10 Essential (primary) hypertension: Secondary | ICD-10-CM | POA: Diagnosis not present

## 2018-12-01 DIAGNOSIS — G311 Senile degeneration of brain, not elsewhere classified: Secondary | ICD-10-CM | POA: Diagnosis not present

## 2018-12-01 DIAGNOSIS — I10 Essential (primary) hypertension: Secondary | ICD-10-CM | POA: Diagnosis not present

## 2018-12-01 DIAGNOSIS — F0280 Dementia in other diseases classified elsewhere without behavioral disturbance: Secondary | ICD-10-CM | POA: Diagnosis not present

## 2018-12-01 DIAGNOSIS — R32 Unspecified urinary incontinence: Secondary | ICD-10-CM | POA: Diagnosis not present

## 2018-12-01 DIAGNOSIS — C719 Malignant neoplasm of brain, unspecified: Secondary | ICD-10-CM | POA: Diagnosis not present

## 2018-12-01 DIAGNOSIS — G4452 New daily persistent headache (NDPH): Secondary | ICD-10-CM | POA: Diagnosis not present

## 2018-12-02 DIAGNOSIS — R32 Unspecified urinary incontinence: Secondary | ICD-10-CM | POA: Diagnosis not present

## 2018-12-02 DIAGNOSIS — C719 Malignant neoplasm of brain, unspecified: Secondary | ICD-10-CM | POA: Diagnosis not present

## 2018-12-02 DIAGNOSIS — G311 Senile degeneration of brain, not elsewhere classified: Secondary | ICD-10-CM | POA: Diagnosis not present

## 2018-12-02 DIAGNOSIS — G4452 New daily persistent headache (NDPH): Secondary | ICD-10-CM | POA: Diagnosis not present

## 2018-12-02 DIAGNOSIS — Z515 Encounter for palliative care: Secondary | ICD-10-CM | POA: Diagnosis not present

## 2018-12-02 DIAGNOSIS — F0280 Dementia in other diseases classified elsewhere without behavioral disturbance: Secondary | ICD-10-CM | POA: Diagnosis not present

## 2018-12-02 DIAGNOSIS — I10 Essential (primary) hypertension: Secondary | ICD-10-CM | POA: Diagnosis not present

## 2018-12-02 DIAGNOSIS — K5900 Constipation, unspecified: Secondary | ICD-10-CM | POA: Diagnosis not present

## 2018-12-07 DIAGNOSIS — E785 Hyperlipidemia, unspecified: Secondary | ICD-10-CM | POA: Diagnosis not present

## 2018-12-07 DIAGNOSIS — I10 Essential (primary) hypertension: Secondary | ICD-10-CM | POA: Diagnosis not present

## 2018-12-07 DIAGNOSIS — F4323 Adjustment disorder with mixed anxiety and depressed mood: Secondary | ICD-10-CM | POA: Diagnosis not present

## 2018-12-07 DIAGNOSIS — G894 Chronic pain syndrome: Secondary | ICD-10-CM | POA: Diagnosis not present

## 2018-12-07 DIAGNOSIS — C719 Malignant neoplasm of brain, unspecified: Secondary | ICD-10-CM | POA: Diagnosis not present

## 2018-12-08 DIAGNOSIS — R32 Unspecified urinary incontinence: Secondary | ICD-10-CM | POA: Diagnosis not present

## 2018-12-08 DIAGNOSIS — G311 Senile degeneration of brain, not elsewhere classified: Secondary | ICD-10-CM | POA: Diagnosis not present

## 2018-12-08 DIAGNOSIS — I10 Essential (primary) hypertension: Secondary | ICD-10-CM | POA: Diagnosis not present

## 2018-12-08 DIAGNOSIS — G4452 New daily persistent headache (NDPH): Secondary | ICD-10-CM | POA: Diagnosis not present

## 2018-12-08 DIAGNOSIS — F0280 Dementia in other diseases classified elsewhere without behavioral disturbance: Secondary | ICD-10-CM | POA: Diagnosis not present

## 2018-12-08 DIAGNOSIS — C719 Malignant neoplasm of brain, unspecified: Secondary | ICD-10-CM | POA: Diagnosis not present

## 2018-12-13 DIAGNOSIS — F064 Anxiety disorder due to known physiological condition: Secondary | ICD-10-CM | POA: Diagnosis not present

## 2018-12-13 DIAGNOSIS — G301 Alzheimer's disease with late onset: Secondary | ICD-10-CM | POA: Diagnosis not present

## 2018-12-13 DIAGNOSIS — I10 Essential (primary) hypertension: Secondary | ICD-10-CM | POA: Diagnosis not present

## 2018-12-13 DIAGNOSIS — F0280 Dementia in other diseases classified elsewhere without behavioral disturbance: Secondary | ICD-10-CM | POA: Diagnosis not present

## 2018-12-13 DIAGNOSIS — G4452 New daily persistent headache (NDPH): Secondary | ICD-10-CM | POA: Diagnosis not present

## 2018-12-13 DIAGNOSIS — R32 Unspecified urinary incontinence: Secondary | ICD-10-CM | POA: Diagnosis not present

## 2018-12-13 DIAGNOSIS — C719 Malignant neoplasm of brain, unspecified: Secondary | ICD-10-CM | POA: Diagnosis not present

## 2018-12-13 DIAGNOSIS — G311 Senile degeneration of brain, not elsewhere classified: Secondary | ICD-10-CM | POA: Diagnosis not present

## 2018-12-13 DIAGNOSIS — F4323 Adjustment disorder with mixed anxiety and depressed mood: Secondary | ICD-10-CM | POA: Diagnosis not present

## 2018-12-15 DIAGNOSIS — F0280 Dementia in other diseases classified elsewhere without behavioral disturbance: Secondary | ICD-10-CM | POA: Diagnosis not present

## 2018-12-15 DIAGNOSIS — G311 Senile degeneration of brain, not elsewhere classified: Secondary | ICD-10-CM | POA: Diagnosis not present

## 2018-12-15 DIAGNOSIS — C719 Malignant neoplasm of brain, unspecified: Secondary | ICD-10-CM | POA: Diagnosis not present

## 2018-12-15 DIAGNOSIS — G4452 New daily persistent headache (NDPH): Secondary | ICD-10-CM | POA: Diagnosis not present

## 2018-12-15 DIAGNOSIS — I10 Essential (primary) hypertension: Secondary | ICD-10-CM | POA: Diagnosis not present

## 2018-12-15 DIAGNOSIS — R32 Unspecified urinary incontinence: Secondary | ICD-10-CM | POA: Diagnosis not present

## 2018-12-21 DIAGNOSIS — F4323 Adjustment disorder with mixed anxiety and depressed mood: Secondary | ICD-10-CM | POA: Diagnosis not present

## 2018-12-22 DIAGNOSIS — R51 Headache: Secondary | ICD-10-CM | POA: Diagnosis not present

## 2018-12-22 DIAGNOSIS — G894 Chronic pain syndrome: Secondary | ICD-10-CM | POA: Diagnosis not present

## 2018-12-22 DIAGNOSIS — I10 Essential (primary) hypertension: Secondary | ICD-10-CM | POA: Diagnosis not present

## 2018-12-22 DIAGNOSIS — C719 Malignant neoplasm of brain, unspecified: Secondary | ICD-10-CM | POA: Diagnosis not present

## 2018-12-29 DIAGNOSIS — C719 Malignant neoplasm of brain, unspecified: Secondary | ICD-10-CM | POA: Diagnosis not present

## 2018-12-29 DIAGNOSIS — F0280 Dementia in other diseases classified elsewhere without behavioral disturbance: Secondary | ICD-10-CM | POA: Diagnosis not present

## 2018-12-29 DIAGNOSIS — G311 Senile degeneration of brain, not elsewhere classified: Secondary | ICD-10-CM | POA: Diagnosis not present

## 2018-12-29 DIAGNOSIS — R32 Unspecified urinary incontinence: Secondary | ICD-10-CM | POA: Diagnosis not present

## 2018-12-29 DIAGNOSIS — I10 Essential (primary) hypertension: Secondary | ICD-10-CM | POA: Diagnosis not present

## 2018-12-29 DIAGNOSIS — G4452 New daily persistent headache (NDPH): Secondary | ICD-10-CM | POA: Diagnosis not present

## 2018-12-29 IMAGING — CR DG CHEST 2V
2 series · 2 of 2 positions shown · non-contrast
Comparison: Chest radiograph April 21, 2017

CLINICAL DATA: Atrial fibrillation, palpitations.

EXAM:
CHEST - 2 VIEW

[chest lat]
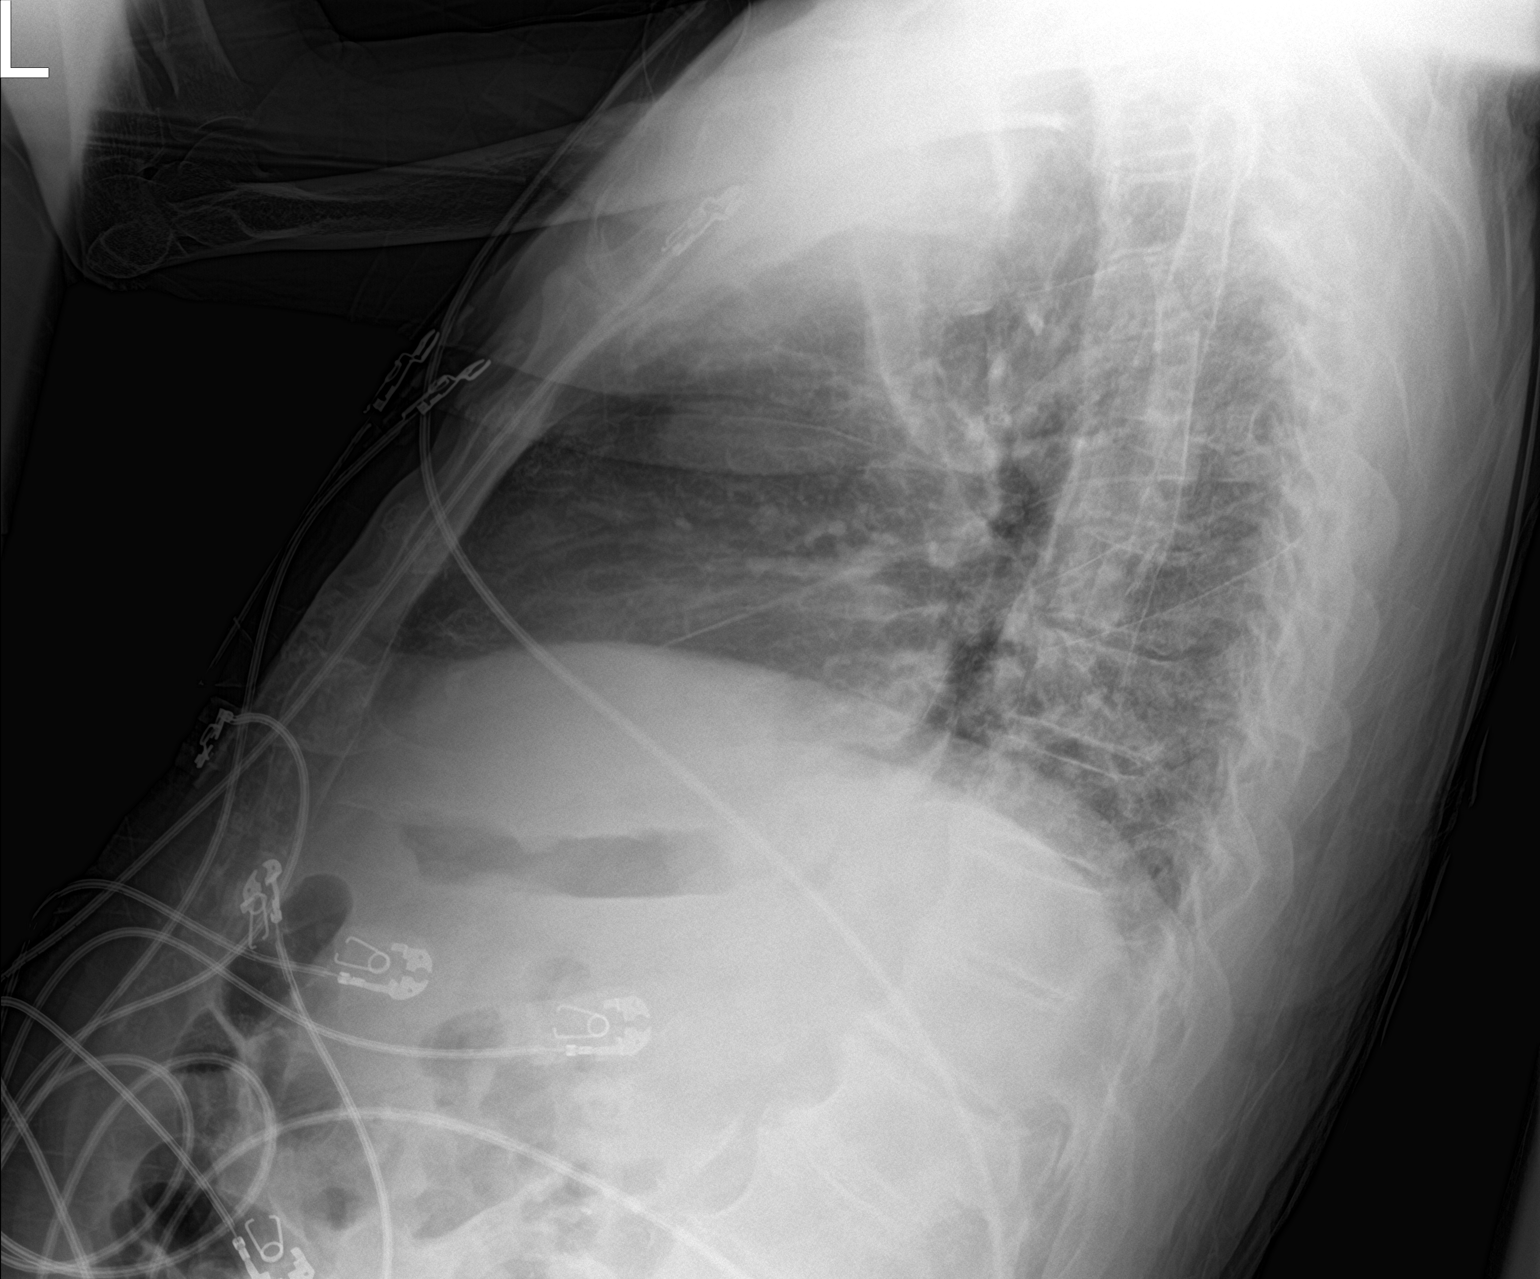

[chest ap]
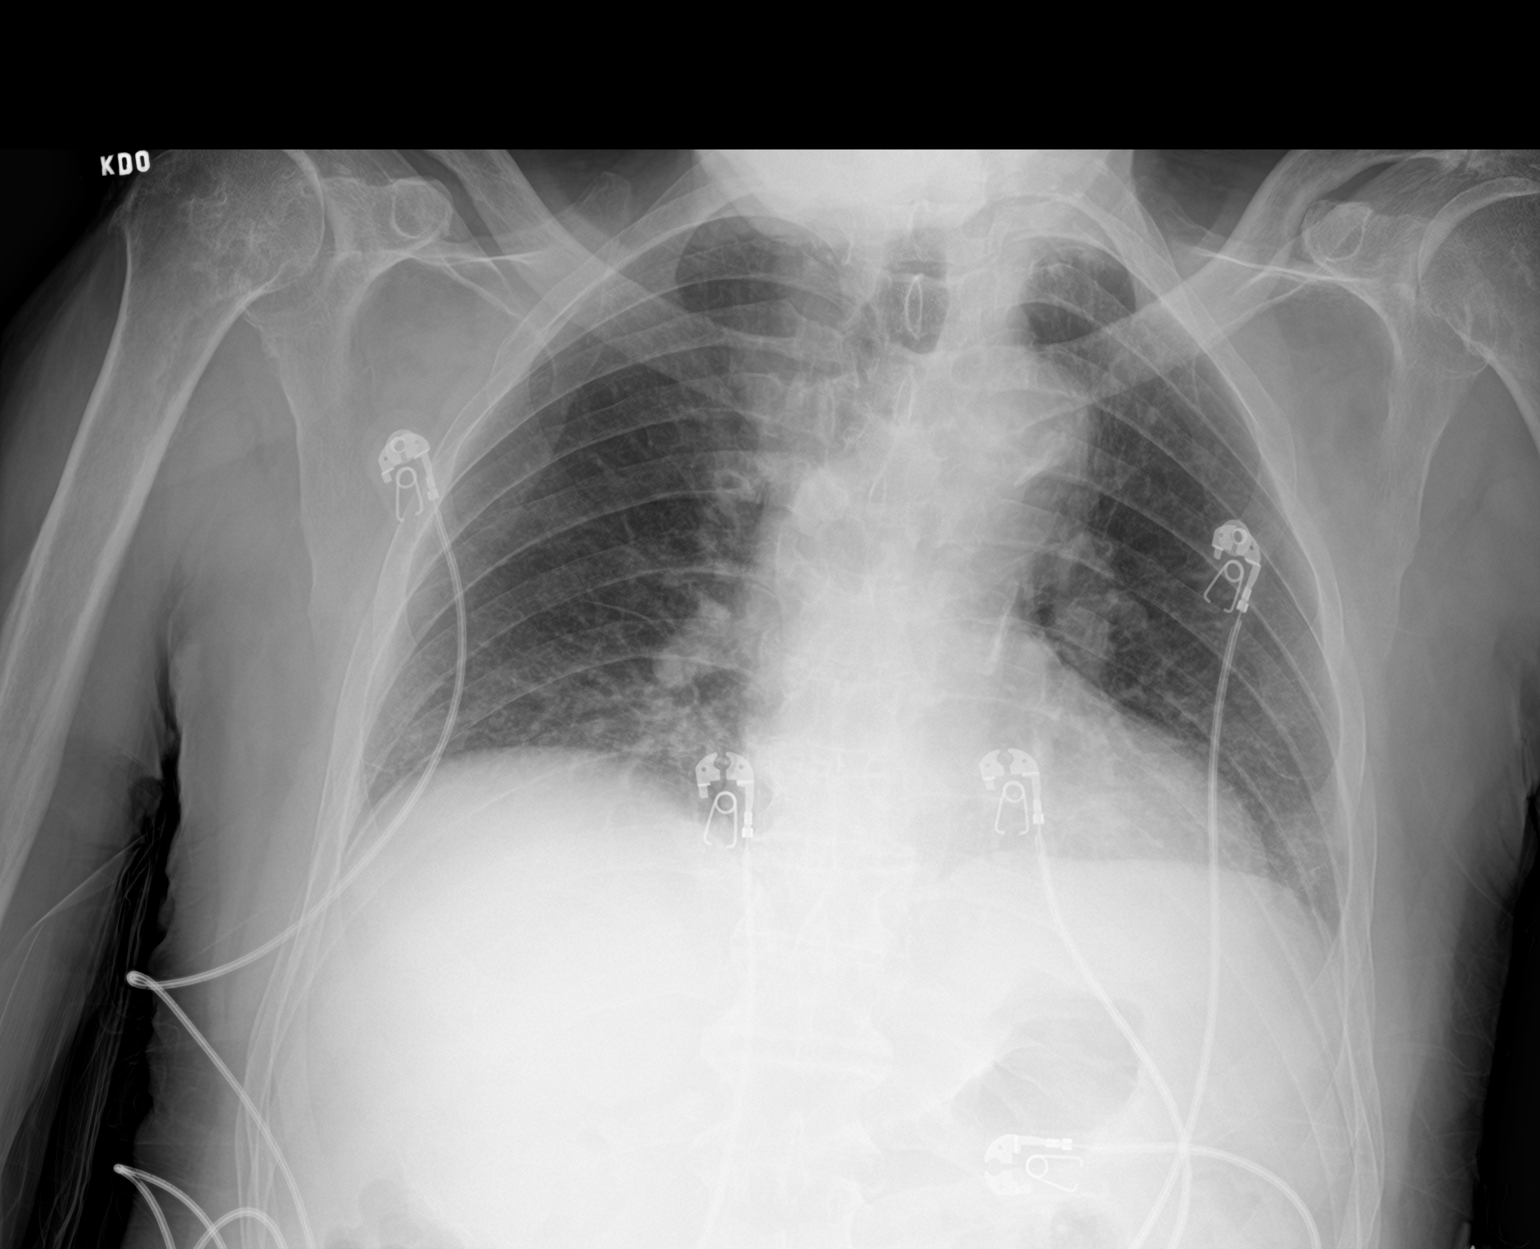

[2 of 2 positions shown; findings below may reference images not displayed]

FINDINGS: Cardiomediastinal silhouette is unremarkable for this low
inspiratory examination with crowded vasculature markings. Calcified
aortic arch. The lungs are clear without pleural effusions or focal
consolidations. Faint bibasilar strandy densities. Biapical pleural
thickening. Trachea projects midline and there is no pneumothorax.
Included soft tissue planes and osseous structures are
non-suspicious. Stable degenerative change of the shoulders.
IMPRESSION: Similar bibasilar atelectasis in this low inspiratory examination.

Aortic Atherosclerosis ([id number]).

## 2018-12-30 DIAGNOSIS — F4323 Adjustment disorder with mixed anxiety and depressed mood: Secondary | ICD-10-CM | POA: Diagnosis not present

## 2019-01-02 DIAGNOSIS — K5900 Constipation, unspecified: Secondary | ICD-10-CM | POA: Diagnosis not present

## 2019-01-02 DIAGNOSIS — I10 Essential (primary) hypertension: Secondary | ICD-10-CM | POA: Diagnosis not present

## 2019-01-02 DIAGNOSIS — Z515 Encounter for palliative care: Secondary | ICD-10-CM | POA: Diagnosis not present

## 2019-01-02 DIAGNOSIS — C719 Malignant neoplasm of brain, unspecified: Secondary | ICD-10-CM | POA: Diagnosis not present

## 2019-01-02 DIAGNOSIS — R32 Unspecified urinary incontinence: Secondary | ICD-10-CM | POA: Diagnosis not present

## 2019-01-02 DIAGNOSIS — F0280 Dementia in other diseases classified elsewhere without behavioral disturbance: Secondary | ICD-10-CM | POA: Diagnosis not present

## 2019-01-02 DIAGNOSIS — F4323 Adjustment disorder with mixed anxiety and depressed mood: Secondary | ICD-10-CM | POA: Diagnosis not present

## 2019-01-02 DIAGNOSIS — G311 Senile degeneration of brain, not elsewhere classified: Secondary | ICD-10-CM | POA: Diagnosis not present

## 2019-01-02 DIAGNOSIS — G4452 New daily persistent headache (NDPH): Secondary | ICD-10-CM | POA: Diagnosis not present

## 2019-01-04 DIAGNOSIS — I10 Essential (primary) hypertension: Secondary | ICD-10-CM | POA: Diagnosis not present

## 2019-01-04 DIAGNOSIS — F4323 Adjustment disorder with mixed anxiety and depressed mood: Secondary | ICD-10-CM | POA: Diagnosis not present

## 2019-01-04 DIAGNOSIS — C719 Malignant neoplasm of brain, unspecified: Secondary | ICD-10-CM | POA: Diagnosis not present

## 2019-01-04 DIAGNOSIS — R51 Headache: Secondary | ICD-10-CM | POA: Diagnosis not present

## 2019-01-09 DIAGNOSIS — R1311 Dysphagia, oral phase: Secondary | ICD-10-CM | POA: Diagnosis not present

## 2019-01-09 DIAGNOSIS — R488 Other symbolic dysfunctions: Secondary | ICD-10-CM | POA: Diagnosis not present

## 2019-01-09 DIAGNOSIS — G301 Alzheimer's disease with late onset: Secondary | ICD-10-CM | POA: Diagnosis not present

## 2019-01-09 DIAGNOSIS — Z85841 Personal history of malignant neoplasm of brain: Secondary | ICD-10-CM | POA: Diagnosis not present

## 2019-01-09 DIAGNOSIS — F0390 Unspecified dementia without behavioral disturbance: Secondary | ICD-10-CM | POA: Diagnosis not present

## 2019-01-10 DIAGNOSIS — G301 Alzheimer's disease with late onset: Secondary | ICD-10-CM | POA: Diagnosis not present

## 2019-01-10 DIAGNOSIS — Z85841 Personal history of malignant neoplasm of brain: Secondary | ICD-10-CM | POA: Diagnosis not present

## 2019-01-10 DIAGNOSIS — F0390 Unspecified dementia without behavioral disturbance: Secondary | ICD-10-CM | POA: Diagnosis not present

## 2019-01-10 DIAGNOSIS — R488 Other symbolic dysfunctions: Secondary | ICD-10-CM | POA: Diagnosis not present

## 2019-01-10 DIAGNOSIS — R1311 Dysphagia, oral phase: Secondary | ICD-10-CM | POA: Diagnosis not present

## 2019-01-11 DIAGNOSIS — R488 Other symbolic dysfunctions: Secondary | ICD-10-CM | POA: Diagnosis not present

## 2019-01-11 DIAGNOSIS — F0390 Unspecified dementia without behavioral disturbance: Secondary | ICD-10-CM | POA: Diagnosis not present

## 2019-01-11 DIAGNOSIS — Z85841 Personal history of malignant neoplasm of brain: Secondary | ICD-10-CM | POA: Diagnosis not present

## 2019-01-11 DIAGNOSIS — F0150 Vascular dementia without behavioral disturbance: Secondary | ICD-10-CM | POA: Diagnosis not present

## 2019-01-11 DIAGNOSIS — R1311 Dysphagia, oral phase: Secondary | ICD-10-CM | POA: Diagnosis not present

## 2019-01-11 DIAGNOSIS — G301 Alzheimer's disease with late onset: Secondary | ICD-10-CM | POA: Diagnosis not present

## 2019-01-12 DIAGNOSIS — R488 Other symbolic dysfunctions: Secondary | ICD-10-CM | POA: Diagnosis not present

## 2019-01-12 DIAGNOSIS — R1311 Dysphagia, oral phase: Secondary | ICD-10-CM | POA: Diagnosis not present

## 2019-01-12 DIAGNOSIS — G301 Alzheimer's disease with late onset: Secondary | ICD-10-CM | POA: Diagnosis not present

## 2019-01-12 DIAGNOSIS — F0390 Unspecified dementia without behavioral disturbance: Secondary | ICD-10-CM | POA: Diagnosis not present

## 2019-01-12 DIAGNOSIS — Z85841 Personal history of malignant neoplasm of brain: Secondary | ICD-10-CM | POA: Diagnosis not present

## 2019-01-15 DIAGNOSIS — G301 Alzheimer's disease with late onset: Secondary | ICD-10-CM | POA: Diagnosis not present

## 2019-01-15 DIAGNOSIS — Z85841 Personal history of malignant neoplasm of brain: Secondary | ICD-10-CM | POA: Diagnosis not present

## 2019-01-15 DIAGNOSIS — F0390 Unspecified dementia without behavioral disturbance: Secondary | ICD-10-CM | POA: Diagnosis not present

## 2019-01-15 DIAGNOSIS — F4323 Adjustment disorder with mixed anxiety and depressed mood: Secondary | ICD-10-CM | POA: Diagnosis not present

## 2019-01-15 DIAGNOSIS — R1311 Dysphagia, oral phase: Secondary | ICD-10-CM | POA: Diagnosis not present

## 2019-01-15 DIAGNOSIS — R488 Other symbolic dysfunctions: Secondary | ICD-10-CM | POA: Diagnosis not present

## 2019-01-16 DIAGNOSIS — Z85841 Personal history of malignant neoplasm of brain: Secondary | ICD-10-CM | POA: Diagnosis not present

## 2019-01-16 DIAGNOSIS — I10 Essential (primary) hypertension: Secondary | ICD-10-CM | POA: Diagnosis not present

## 2019-01-16 DIAGNOSIS — G301 Alzheimer's disease with late onset: Secondary | ICD-10-CM | POA: Diagnosis not present

## 2019-01-16 DIAGNOSIS — R1311 Dysphagia, oral phase: Secondary | ICD-10-CM | POA: Diagnosis not present

## 2019-01-16 DIAGNOSIS — C719 Malignant neoplasm of brain, unspecified: Secondary | ICD-10-CM | POA: Diagnosis not present

## 2019-01-16 DIAGNOSIS — F0390 Unspecified dementia without behavioral disturbance: Secondary | ICD-10-CM | POA: Diagnosis not present

## 2019-01-16 DIAGNOSIS — E785 Hyperlipidemia, unspecified: Secondary | ICD-10-CM | POA: Diagnosis not present

## 2019-01-16 DIAGNOSIS — R488 Other symbolic dysfunctions: Secondary | ICD-10-CM | POA: Diagnosis not present

## 2019-01-17 DIAGNOSIS — F0390 Unspecified dementia without behavioral disturbance: Secondary | ICD-10-CM | POA: Diagnosis not present

## 2019-01-17 DIAGNOSIS — R1311 Dysphagia, oral phase: Secondary | ICD-10-CM | POA: Diagnosis not present

## 2019-01-17 DIAGNOSIS — G301 Alzheimer's disease with late onset: Secondary | ICD-10-CM | POA: Diagnosis not present

## 2019-01-17 DIAGNOSIS — Z85841 Personal history of malignant neoplasm of brain: Secondary | ICD-10-CM | POA: Diagnosis not present

## 2019-01-17 DIAGNOSIS — R488 Other symbolic dysfunctions: Secondary | ICD-10-CM | POA: Diagnosis not present

## 2019-01-18 DIAGNOSIS — F0390 Unspecified dementia without behavioral disturbance: Secondary | ICD-10-CM | POA: Diagnosis not present

## 2019-01-18 DIAGNOSIS — G301 Alzheimer's disease with late onset: Secondary | ICD-10-CM | POA: Diagnosis not present

## 2019-01-18 DIAGNOSIS — R1311 Dysphagia, oral phase: Secondary | ICD-10-CM | POA: Diagnosis not present

## 2019-01-18 DIAGNOSIS — R488 Other symbolic dysfunctions: Secondary | ICD-10-CM | POA: Diagnosis not present

## 2019-01-18 DIAGNOSIS — Z85841 Personal history of malignant neoplasm of brain: Secondary | ICD-10-CM | POA: Diagnosis not present

## 2019-01-19 DIAGNOSIS — G301 Alzheimer's disease with late onset: Secondary | ICD-10-CM | POA: Diagnosis not present

## 2019-01-19 DIAGNOSIS — R488 Other symbolic dysfunctions: Secondary | ICD-10-CM | POA: Diagnosis not present

## 2019-01-19 DIAGNOSIS — F0390 Unspecified dementia without behavioral disturbance: Secondary | ICD-10-CM | POA: Diagnosis not present

## 2019-01-19 DIAGNOSIS — Z85841 Personal history of malignant neoplasm of brain: Secondary | ICD-10-CM | POA: Diagnosis not present

## 2019-01-19 DIAGNOSIS — R1311 Dysphagia, oral phase: Secondary | ICD-10-CM | POA: Diagnosis not present

## 2019-01-20 DIAGNOSIS — Z20828 Contact with and (suspected) exposure to other viral communicable diseases: Secondary | ICD-10-CM | POA: Diagnosis not present

## 2019-01-22 DIAGNOSIS — R1311 Dysphagia, oral phase: Secondary | ICD-10-CM | POA: Diagnosis not present

## 2019-01-22 DIAGNOSIS — G301 Alzheimer's disease with late onset: Secondary | ICD-10-CM | POA: Diagnosis not present

## 2019-01-22 DIAGNOSIS — F4323 Adjustment disorder with mixed anxiety and depressed mood: Secondary | ICD-10-CM | POA: Diagnosis not present

## 2019-01-22 DIAGNOSIS — R488 Other symbolic dysfunctions: Secondary | ICD-10-CM | POA: Diagnosis not present

## 2019-01-22 DIAGNOSIS — Z85841 Personal history of malignant neoplasm of brain: Secondary | ICD-10-CM | POA: Diagnosis not present

## 2019-01-22 DIAGNOSIS — F0390 Unspecified dementia without behavioral disturbance: Secondary | ICD-10-CM | POA: Diagnosis not present

## 2019-01-23 DIAGNOSIS — F0390 Unspecified dementia without behavioral disturbance: Secondary | ICD-10-CM | POA: Diagnosis not present

## 2019-01-23 DIAGNOSIS — G301 Alzheimer's disease with late onset: Secondary | ICD-10-CM | POA: Diagnosis not present

## 2019-01-23 DIAGNOSIS — R1311 Dysphagia, oral phase: Secondary | ICD-10-CM | POA: Diagnosis not present

## 2019-01-23 DIAGNOSIS — R488 Other symbolic dysfunctions: Secondary | ICD-10-CM | POA: Diagnosis not present

## 2019-01-23 DIAGNOSIS — Z85841 Personal history of malignant neoplasm of brain: Secondary | ICD-10-CM | POA: Diagnosis not present

## 2019-01-24 DIAGNOSIS — Z85841 Personal history of malignant neoplasm of brain: Secondary | ICD-10-CM | POA: Diagnosis not present

## 2019-01-24 DIAGNOSIS — R1311 Dysphagia, oral phase: Secondary | ICD-10-CM | POA: Diagnosis not present

## 2019-01-24 DIAGNOSIS — F0390 Unspecified dementia without behavioral disturbance: Secondary | ICD-10-CM | POA: Diagnosis not present

## 2019-01-24 DIAGNOSIS — R488 Other symbolic dysfunctions: Secondary | ICD-10-CM | POA: Diagnosis not present

## 2019-01-24 DIAGNOSIS — G301 Alzheimer's disease with late onset: Secondary | ICD-10-CM | POA: Diagnosis not present

## 2019-01-25 DIAGNOSIS — F0390 Unspecified dementia without behavioral disturbance: Secondary | ICD-10-CM | POA: Diagnosis not present

## 2019-01-25 DIAGNOSIS — R488 Other symbolic dysfunctions: Secondary | ICD-10-CM | POA: Diagnosis not present

## 2019-01-25 DIAGNOSIS — R1311 Dysphagia, oral phase: Secondary | ICD-10-CM | POA: Diagnosis not present

## 2019-01-25 DIAGNOSIS — Z85841 Personal history of malignant neoplasm of brain: Secondary | ICD-10-CM | POA: Diagnosis not present

## 2019-01-25 DIAGNOSIS — G301 Alzheimer's disease with late onset: Secondary | ICD-10-CM | POA: Diagnosis not present

## 2019-01-26 DIAGNOSIS — F0390 Unspecified dementia without behavioral disturbance: Secondary | ICD-10-CM | POA: Diagnosis not present

## 2019-01-26 DIAGNOSIS — Z85841 Personal history of malignant neoplasm of brain: Secondary | ICD-10-CM | POA: Diagnosis not present

## 2019-01-26 DIAGNOSIS — R488 Other symbolic dysfunctions: Secondary | ICD-10-CM | POA: Diagnosis not present

## 2019-01-26 DIAGNOSIS — R1311 Dysphagia, oral phase: Secondary | ICD-10-CM | POA: Diagnosis not present

## 2019-01-26 DIAGNOSIS — G301 Alzheimer's disease with late onset: Secondary | ICD-10-CM | POA: Diagnosis not present

## 2019-01-29 DIAGNOSIS — R488 Other symbolic dysfunctions: Secondary | ICD-10-CM | POA: Diagnosis not present

## 2019-01-29 DIAGNOSIS — R1311 Dysphagia, oral phase: Secondary | ICD-10-CM | POA: Diagnosis not present

## 2019-01-29 DIAGNOSIS — Z85841 Personal history of malignant neoplasm of brain: Secondary | ICD-10-CM | POA: Diagnosis not present

## 2019-01-29 DIAGNOSIS — F0390 Unspecified dementia without behavioral disturbance: Secondary | ICD-10-CM | POA: Diagnosis not present

## 2019-01-29 DIAGNOSIS — G301 Alzheimer's disease with late onset: Secondary | ICD-10-CM | POA: Diagnosis not present

## 2019-01-30 DIAGNOSIS — G301 Alzheimer's disease with late onset: Secondary | ICD-10-CM | POA: Diagnosis not present

## 2019-01-30 DIAGNOSIS — F0390 Unspecified dementia without behavioral disturbance: Secondary | ICD-10-CM | POA: Diagnosis not present

## 2019-01-30 DIAGNOSIS — R1311 Dysphagia, oral phase: Secondary | ICD-10-CM | POA: Diagnosis not present

## 2019-01-30 DIAGNOSIS — R488 Other symbolic dysfunctions: Secondary | ICD-10-CM | POA: Diagnosis not present

## 2019-01-30 DIAGNOSIS — Z85841 Personal history of malignant neoplasm of brain: Secondary | ICD-10-CM | POA: Diagnosis not present

## 2019-01-31 DIAGNOSIS — R488 Other symbolic dysfunctions: Secondary | ICD-10-CM | POA: Diagnosis not present

## 2019-01-31 DIAGNOSIS — F0390 Unspecified dementia without behavioral disturbance: Secondary | ICD-10-CM | POA: Diagnosis not present

## 2019-01-31 DIAGNOSIS — R1311 Dysphagia, oral phase: Secondary | ICD-10-CM | POA: Diagnosis not present

## 2019-01-31 DIAGNOSIS — G301 Alzheimer's disease with late onset: Secondary | ICD-10-CM | POA: Diagnosis not present

## 2019-01-31 DIAGNOSIS — Z85841 Personal history of malignant neoplasm of brain: Secondary | ICD-10-CM | POA: Diagnosis not present

## 2019-02-01 DIAGNOSIS — R1311 Dysphagia, oral phase: Secondary | ICD-10-CM | POA: Diagnosis not present

## 2019-02-01 DIAGNOSIS — G301 Alzheimer's disease with late onset: Secondary | ICD-10-CM | POA: Diagnosis not present

## 2019-02-01 DIAGNOSIS — R488 Other symbolic dysfunctions: Secondary | ICD-10-CM | POA: Diagnosis not present

## 2019-02-01 DIAGNOSIS — Z23 Encounter for immunization: Secondary | ICD-10-CM | POA: Diagnosis not present

## 2019-02-01 DIAGNOSIS — F0390 Unspecified dementia without behavioral disturbance: Secondary | ICD-10-CM | POA: Diagnosis not present

## 2019-02-01 DIAGNOSIS — Z85841 Personal history of malignant neoplasm of brain: Secondary | ICD-10-CM | POA: Diagnosis not present

## 2019-02-02 DIAGNOSIS — R488 Other symbolic dysfunctions: Secondary | ICD-10-CM | POA: Diagnosis not present

## 2019-02-02 DIAGNOSIS — Z23 Encounter for immunization: Secondary | ICD-10-CM | POA: Diagnosis not present

## 2019-02-02 DIAGNOSIS — G301 Alzheimer's disease with late onset: Secondary | ICD-10-CM | POA: Diagnosis not present

## 2019-02-02 DIAGNOSIS — R1311 Dysphagia, oral phase: Secondary | ICD-10-CM | POA: Diagnosis not present

## 2019-02-02 DIAGNOSIS — F0390 Unspecified dementia without behavioral disturbance: Secondary | ICD-10-CM | POA: Diagnosis not present

## 2019-02-02 DIAGNOSIS — Z85841 Personal history of malignant neoplasm of brain: Secondary | ICD-10-CM | POA: Diagnosis not present

## 2019-02-03 DIAGNOSIS — R1311 Dysphagia, oral phase: Secondary | ICD-10-CM | POA: Diagnosis not present

## 2019-02-03 DIAGNOSIS — G301 Alzheimer's disease with late onset: Secondary | ICD-10-CM | POA: Diagnosis not present

## 2019-02-03 DIAGNOSIS — Z85841 Personal history of malignant neoplasm of brain: Secondary | ICD-10-CM | POA: Diagnosis not present

## 2019-02-03 DIAGNOSIS — R488 Other symbolic dysfunctions: Secondary | ICD-10-CM | POA: Diagnosis not present

## 2019-02-03 DIAGNOSIS — F0390 Unspecified dementia without behavioral disturbance: Secondary | ICD-10-CM | POA: Diagnosis not present

## 2019-02-03 DIAGNOSIS — Z23 Encounter for immunization: Secondary | ICD-10-CM | POA: Diagnosis not present

## 2019-02-05 DIAGNOSIS — Z23 Encounter for immunization: Secondary | ICD-10-CM | POA: Diagnosis not present

## 2019-02-05 DIAGNOSIS — G301 Alzheimer's disease with late onset: Secondary | ICD-10-CM | POA: Diagnosis not present

## 2019-02-05 DIAGNOSIS — F0390 Unspecified dementia without behavioral disturbance: Secondary | ICD-10-CM | POA: Diagnosis not present

## 2019-02-05 DIAGNOSIS — R488 Other symbolic dysfunctions: Secondary | ICD-10-CM | POA: Diagnosis not present

## 2019-02-05 DIAGNOSIS — R1311 Dysphagia, oral phase: Secondary | ICD-10-CM | POA: Diagnosis not present

## 2019-02-05 DIAGNOSIS — Z85841 Personal history of malignant neoplasm of brain: Secondary | ICD-10-CM | POA: Diagnosis not present

## 2019-02-06 DIAGNOSIS — Z23 Encounter for immunization: Secondary | ICD-10-CM | POA: Diagnosis not present

## 2019-02-06 DIAGNOSIS — Z85841 Personal history of malignant neoplasm of brain: Secondary | ICD-10-CM | POA: Diagnosis not present

## 2019-02-06 DIAGNOSIS — G301 Alzheimer's disease with late onset: Secondary | ICD-10-CM | POA: Diagnosis not present

## 2019-02-06 DIAGNOSIS — R488 Other symbolic dysfunctions: Secondary | ICD-10-CM | POA: Diagnosis not present

## 2019-02-06 DIAGNOSIS — R1311 Dysphagia, oral phase: Secondary | ICD-10-CM | POA: Diagnosis not present

## 2019-02-06 DIAGNOSIS — F0390 Unspecified dementia without behavioral disturbance: Secondary | ICD-10-CM | POA: Diagnosis not present

## 2019-02-07 DIAGNOSIS — G301 Alzheimer's disease with late onset: Secondary | ICD-10-CM | POA: Diagnosis not present

## 2019-02-07 DIAGNOSIS — Z85841 Personal history of malignant neoplasm of brain: Secondary | ICD-10-CM | POA: Diagnosis not present

## 2019-02-07 DIAGNOSIS — F0390 Unspecified dementia without behavioral disturbance: Secondary | ICD-10-CM | POA: Diagnosis not present

## 2019-02-07 DIAGNOSIS — R1311 Dysphagia, oral phase: Secondary | ICD-10-CM | POA: Diagnosis not present

## 2019-02-07 DIAGNOSIS — R488 Other symbolic dysfunctions: Secondary | ICD-10-CM | POA: Diagnosis not present

## 2019-02-07 DIAGNOSIS — Z23 Encounter for immunization: Secondary | ICD-10-CM | POA: Diagnosis not present

## 2019-02-08 DIAGNOSIS — I10 Essential (primary) hypertension: Secondary | ICD-10-CM | POA: Diagnosis not present

## 2019-02-08 DIAGNOSIS — E785 Hyperlipidemia, unspecified: Secondary | ICD-10-CM | POA: Diagnosis not present

## 2019-02-08 DIAGNOSIS — R531 Weakness: Secondary | ICD-10-CM | POA: Diagnosis not present

## 2019-02-08 DIAGNOSIS — G894 Chronic pain syndrome: Secondary | ICD-10-CM | POA: Diagnosis not present

## 2019-02-08 DIAGNOSIS — H25813 Combined forms of age-related cataract, bilateral: Secondary | ICD-10-CM | POA: Diagnosis not present

## 2019-02-09 DIAGNOSIS — C719 Malignant neoplasm of brain, unspecified: Secondary | ICD-10-CM | POA: Diagnosis not present

## 2019-02-09 DIAGNOSIS — E785 Hyperlipidemia, unspecified: Secondary | ICD-10-CM | POA: Diagnosis not present

## 2019-02-09 DIAGNOSIS — I10 Essential (primary) hypertension: Secondary | ICD-10-CM | POA: Diagnosis not present

## 2019-02-09 DIAGNOSIS — G894 Chronic pain syndrome: Secondary | ICD-10-CM | POA: Diagnosis not present

## 2019-02-12 DIAGNOSIS — F4323 Adjustment disorder with mixed anxiety and depressed mood: Secondary | ICD-10-CM | POA: Diagnosis not present

## 2019-02-16 DIAGNOSIS — G894 Chronic pain syndrome: Secondary | ICD-10-CM | POA: Diagnosis not present

## 2019-02-16 DIAGNOSIS — E785 Hyperlipidemia, unspecified: Secondary | ICD-10-CM | POA: Diagnosis not present

## 2019-02-16 DIAGNOSIS — C719 Malignant neoplasm of brain, unspecified: Secondary | ICD-10-CM | POA: Diagnosis not present

## 2019-02-16 DIAGNOSIS — I10 Essential (primary) hypertension: Secondary | ICD-10-CM | POA: Diagnosis not present

## 2019-02-19 DIAGNOSIS — E785 Hyperlipidemia, unspecified: Secondary | ICD-10-CM | POA: Diagnosis not present

## 2019-02-19 DIAGNOSIS — E039 Hypothyroidism, unspecified: Secondary | ICD-10-CM | POA: Diagnosis not present

## 2019-02-19 DIAGNOSIS — E119 Type 2 diabetes mellitus without complications: Secondary | ICD-10-CM | POA: Diagnosis not present

## 2019-02-19 DIAGNOSIS — I10 Essential (primary) hypertension: Secondary | ICD-10-CM | POA: Diagnosis not present

## 2019-02-19 DIAGNOSIS — E559 Vitamin D deficiency, unspecified: Secondary | ICD-10-CM | POA: Diagnosis not present

## 2019-02-19 DIAGNOSIS — D649 Anemia, unspecified: Secondary | ICD-10-CM | POA: Diagnosis not present

## 2019-02-20 DIAGNOSIS — I10 Essential (primary) hypertension: Secondary | ICD-10-CM | POA: Diagnosis not present

## 2019-02-20 DIAGNOSIS — E559 Vitamin D deficiency, unspecified: Secondary | ICD-10-CM | POA: Diagnosis not present

## 2019-02-21 DIAGNOSIS — F4323 Adjustment disorder with mixed anxiety and depressed mood: Secondary | ICD-10-CM | POA: Diagnosis not present

## 2019-02-27 DIAGNOSIS — Z20828 Contact with and (suspected) exposure to other viral communicable diseases: Secondary | ICD-10-CM | POA: Diagnosis not present

## 2019-02-28 DIAGNOSIS — F4323 Adjustment disorder with mixed anxiety and depressed mood: Secondary | ICD-10-CM | POA: Diagnosis not present

## 2019-03-05 DIAGNOSIS — Z20828 Contact with and (suspected) exposure to other viral communicable diseases: Secondary | ICD-10-CM | POA: Diagnosis not present

## 2019-03-11 DIAGNOSIS — R296 Repeated falls: Secondary | ICD-10-CM | POA: Diagnosis not present

## 2019-03-11 DIAGNOSIS — R269 Unspecified abnormalities of gait and mobility: Secondary | ICD-10-CM | POA: Diagnosis not present

## 2019-03-12 DIAGNOSIS — R269 Unspecified abnormalities of gait and mobility: Secondary | ICD-10-CM | POA: Diagnosis not present

## 2019-03-12 DIAGNOSIS — R296 Repeated falls: Secondary | ICD-10-CM | POA: Diagnosis not present

## 2019-03-12 DIAGNOSIS — Z20828 Contact with and (suspected) exposure to other viral communicable diseases: Secondary | ICD-10-CM | POA: Diagnosis not present

## 2019-03-12 DIAGNOSIS — F4323 Adjustment disorder with mixed anxiety and depressed mood: Secondary | ICD-10-CM | POA: Diagnosis not present

## 2019-03-15 DIAGNOSIS — J9601 Acute respiratory failure with hypoxia: Secondary | ICD-10-CM | POA: Diagnosis not present

## 2019-03-15 DIAGNOSIS — F0390 Unspecified dementia without behavioral disturbance: Secondary | ICD-10-CM | POA: Diagnosis not present

## 2019-03-15 DIAGNOSIS — R269 Unspecified abnormalities of gait and mobility: Secondary | ICD-10-CM | POA: Diagnosis not present

## 2019-03-15 DIAGNOSIS — U071 COVID-19: Secondary | ICD-10-CM | POA: Diagnosis not present

## 2019-03-24 DIAGNOSIS — F4323 Adjustment disorder with mixed anxiety and depressed mood: Secondary | ICD-10-CM | POA: Diagnosis not present

## 2019-03-25 DIAGNOSIS — F4323 Adjustment disorder with mixed anxiety and depressed mood: Secondary | ICD-10-CM | POA: Diagnosis not present

## 2019-03-25 DIAGNOSIS — R4182 Altered mental status, unspecified: Secondary | ICD-10-CM | POA: Diagnosis not present

## 2019-03-25 DIAGNOSIS — G514 Facial myokymia: Secondary | ICD-10-CM | POA: Diagnosis not present

## 2019-04-05 DIAGNOSIS — Z79899 Other long term (current) drug therapy: Secondary | ICD-10-CM | POA: Diagnosis not present

## 2019-05-04 DIAGNOSIS — 419620001 Death: Secondary | SNOMED CT | POA: Diagnosis not present

## 2019-05-04 DEATH — deceased
